# Patient Record
Sex: Female | Born: 1950 | Race: White | Hispanic: No | Marital: Married | Smoking: Never smoker
Health system: Southern US, Community
[De-identification: ages and names within clinical notes are randomized; demographics above are authoritative.]

## PROBLEM LIST (undated history)

## (undated) ENCOUNTER — Emergency Department (HOSPITAL_BASED_OUTPATIENT_CLINIC_OR_DEPARTMENT_OTHER): Admission: EM | Payer: Medicare HMO

## (undated) DIAGNOSIS — D649 Anemia, unspecified: Secondary | ICD-10-CM

## (undated) DIAGNOSIS — E2839 Other primary ovarian failure: Secondary | ICD-10-CM

## (undated) DIAGNOSIS — E7800 Pure hypercholesterolemia, unspecified: Secondary | ICD-10-CM

## (undated) DIAGNOSIS — M1990 Unspecified osteoarthritis, unspecified site: Secondary | ICD-10-CM

## (undated) DIAGNOSIS — E785 Hyperlipidemia, unspecified: Secondary | ICD-10-CM

## (undated) DIAGNOSIS — T7840XA Allergy, unspecified, initial encounter: Secondary | ICD-10-CM

## (undated) DIAGNOSIS — F0390 Unspecified dementia without behavioral disturbance: Secondary | ICD-10-CM

## (undated) DIAGNOSIS — M1991 Primary osteoarthritis, unspecified site: Secondary | ICD-10-CM

## (undated) DIAGNOSIS — M5136 Other intervertebral disc degeneration, lumbar region: Secondary | ICD-10-CM

## (undated) DIAGNOSIS — N809 Endometriosis, unspecified: Secondary | ICD-10-CM

## (undated) DIAGNOSIS — M51369 Other intervertebral disc degeneration, lumbar region without mention of lumbar back pain or lower extremity pain: Secondary | ICD-10-CM

## (undated) DIAGNOSIS — K8050 Calculus of bile duct without cholangitis or cholecystitis without obstruction: Secondary | ICD-10-CM

## (undated) DIAGNOSIS — J40 Bronchitis, not specified as acute or chronic: Secondary | ICD-10-CM

## (undated) DIAGNOSIS — R413 Other amnesia: Secondary | ICD-10-CM

## (undated) DIAGNOSIS — J45909 Unspecified asthma, uncomplicated: Secondary | ICD-10-CM

## (undated) DIAGNOSIS — G9332 Myalgic encephalomyelitis/chronic fatigue syndrome: Secondary | ICD-10-CM

## (undated) DIAGNOSIS — F329 Major depressive disorder, single episode, unspecified: Secondary | ICD-10-CM

## (undated) HISTORY — PX: TUBAL LIGATION: SHX77

## (undated) HISTORY — DX: Pure hypercholesterolemia, unspecified: E78.00

## (undated) HISTORY — DX: Unspecified asthma, uncomplicated: J45.909

## (undated) HISTORY — DX: Primary osteoarthritis, unspecified site: M19.91

## (undated) HISTORY — DX: Unspecified osteoarthritis, unspecified site: M19.90

## (undated) HISTORY — DX: Anemia, unspecified: D64.9

## (undated) HISTORY — DX: Other amnesia: R41.3

## (undated) HISTORY — DX: Major depressive disorder, single episode, unspecified: F32.9

## (undated) HISTORY — PX: OTHER SURGICAL HISTORY: SHX169

## (undated) HISTORY — DX: Other intervertebral disc degeneration, lumbar region without mention of lumbar back pain or lower extremity pain: M51.369

## (undated) HISTORY — DX: Hypercalcemia: E83.52

## (undated) HISTORY — DX: Myalgic encephalomyelitis/chronic fatigue syndrome: G93.32

## (undated) HISTORY — DX: Other intervertebral disc degeneration, lumbar region: M51.36

## (undated) HISTORY — DX: Calculus of bile duct without cholangitis or cholecystitis without obstruction: K80.50

## (undated) HISTORY — DX: Allergy, unspecified, initial encounter: T78.40XA

## (undated) HISTORY — DX: Unspecified dementia, unspecified severity, without behavioral disturbance, psychotic disturbance, mood disturbance, and anxiety: F03.90

## (undated) HISTORY — DX: Other primary ovarian failure: E28.39

## (undated) HISTORY — DX: Bronchitis, not specified as acute or chronic: J40

## (undated) HISTORY — DX: Hyperlipidemia, unspecified: E78.5

---

## 2006-06-30 ENCOUNTER — Other Ambulatory Visit: Admission: RE | Admit: 2006-06-30 | Discharge: 2006-06-30 | Payer: Self-pay | Admitting: Internal Medicine

## 2006-06-30 ENCOUNTER — Ambulatory Visit: Payer: Self-pay | Admitting: Internal Medicine

## 2006-06-30 ENCOUNTER — Encounter (INDEPENDENT_AMBULATORY_CARE_PROVIDER_SITE_OTHER): Payer: Self-pay | Admitting: *Deleted

## 2007-07-16 ENCOUNTER — Ambulatory Visit: Payer: Self-pay | Admitting: Internal Medicine

## 2007-07-31 ENCOUNTER — Ambulatory Visit: Payer: Self-pay | Admitting: Internal Medicine

## 2007-07-31 ENCOUNTER — Encounter: Payer: Self-pay | Admitting: Internal Medicine

## 2008-05-24 ENCOUNTER — Ambulatory Visit: Payer: Self-pay | Admitting: Cardiology

## 2008-06-10 ENCOUNTER — Ambulatory Visit: Payer: Self-pay | Admitting: Cardiology

## 2008-06-10 LAB — CONVERTED CEMR LAB
ALT: 20 units/L (ref 0–35)
AST: 19 units/L (ref 0–37)
Albumin: 4.3 g/dL (ref 3.5–5.2)
Alkaline Phosphatase: 57 units/L (ref 39–117)
Calcium: 9.8 mg/dL (ref 8.4–10.5)
Chloride: 106 meq/L (ref 96–112)
Creatinine, Ser: 0.8 mg/dL (ref 0.4–1.2)
HDL: 34.8 mg/dL — ABNORMAL LOW (ref >39.0)
LDL Cholesterol: 136 mg/dL — ABNORMAL HIGH (ref 0–99)
Sodium: 140 meq/L (ref 135–145)
Total CHOL/HDL Ratio: 5.4
Total Protein: 7 g/dL (ref 6.0–8.3)
Triglycerides: 85 mg/dL (ref 0–149)

## 2008-06-14 ENCOUNTER — Ambulatory Visit: Payer: Self-pay

## 2008-06-14 ENCOUNTER — Encounter: Payer: Self-pay | Admitting: Cardiology

## 2009-07-11 ENCOUNTER — Emergency Department (HOSPITAL_COMMUNITY): Admission: EM | Admit: 2009-07-11 | Discharge: 2009-07-11 | Payer: Self-pay | Admitting: Emergency Medicine

## 2009-10-03 ENCOUNTER — Encounter: Admission: RE | Admit: 2009-10-03 | Discharge: 2009-10-03 | Payer: Self-pay | Admitting: Internal Medicine

## 2011-02-26 NOTE — Assessment & Plan Note (Signed)
[facility]                            CARDIOLOGY OFFICE NOTE   NAME:[name] Young, [name] Young                          MRN:          [id number]  DATE:05/24/2008                            DOB:          [birth year]    PRIMARY CARE PHYSICIAN:  Jonita Albee, MD   HISTORY OF PRESENT ILLNESS:  This is a 60 year old with a history of  hypercholesterolemia who presents to Cardiology Clinic for evaluation of  dyspnea on exertion.  The patient states that for the last several  months she has felt fatigue and she is unable to keep up with her  family.  She is able to walk at a slow pace; however, if she tries to  speed up and walk faster, she gets short of breath.  She is able to walk  slowly on flat ground with no dyspnea.  She is able climb a flight of  stairs; however, she gets short of breath if she hurries.  She feels  like she is more tired and fatigued than normal and has shortness of  breath with exertion, and it is new for her.  She denies any chest pain  or tightness.  She denies any orthopnea or PND.  She denies any frank  syncope; however, she did have one episode of lightheadedness/presyncope  while at work.  She was standing up in her office and suddenly felt  lightheaded and had to sit down.  She did not actually pass out.  She  had one episode like this and then it has not been repeated.  She states  that she does sleep poorly.  She states she gets leg pain and states  that her legs hurt; however, her legs hurt after standing for long time  and often moving around will decrease the pain in the legs.  She has  been on simvastatin in the past; however, she stopped it because she  thought it was affecting her sleep.  EKG today normal sinus rhythm, poor  anterior R wave progressions, and she is actually bradycardic at rate of  58.   PAST MEDICAL HISTORY:  1. Hypercholesterolemia.  2. Low vitamin D.  3. Chronic back pain.   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  She has  not smoked since her teens.  She does not drink alcohol or use illicit  drugs.  She has 3 children and is married.   FAMILY HISTORY:  The patient's father had lung cancer.  Her grandmother  had a heart attack at the age of 69.   MEDICATIONS:  The patient takes calcium, vitamin D, and 81 mg daily of  aspirin.   REVIEW OF SYSTEMS:  Negative except as noted in the history of present  illness.   PHYSICAL EXAMINATION:  VITAL SIGNS:  Today, weight 148 pounds, blood  pressure 105/60, heart rate 58 and regular.  GENERAL:  No apparent distress.  Mildly obese female.  NEUROLOGICAL:  Alert and oriented x3.  Affect is somewhat depressed.  NECK:  No JVD.  Thyroid not enlarged  and not nodular.  CARDIOVASCULAR:  Regular S1 and S2.  No S3.  No S4.  No murmur.  No  peripheral edema.  No carotid bruit.  LUNGS:  Clear to auscultation bilaterally with normal respiratory  effort.  ABDOMEN:  Soft, nontender, and mildly obese.  No hepatosplenomegaly.  EXTREMITIES:  No clubbing or cyanosis.  HEENT:  Normal exam.  SKIN:  Normal exam.  MUSCULOSKELETAL:  Normal exam.   ASSESSMENT AND PLAN:  This is a 60 year old with a history of  hypercholesterolemia who presents for evaluation of dyspnea on exertion.  The patient does state that she is somewhat short of breath and fatigued  over the past 6-8 months when she tries to walk fast or if she tries to  climb up a flight of stairs fast.  She is able to do these at a slow  pace without shortness of breath.  She feels in general, she is more  tired than usual.  She did have one episode of presyncope while standing  up at her office, this was several weeks ago.  Her EKG shows poor  anterior R wave progression and is otherwise unremarkable.  She does  have a somewhat depressed affect and slow speech.  Thyroid is  unremarkable on exam.  She is not volume overloaded on exam.  I think,  it is very possible that this patient  has hypothyroidism.  Her only risk  factor for CAD is hypercholesterolemia.   PLAN:  We will check thyroid function test for this patient.  We will  also check lipids and if her LDL is significantly elevated, we will  start her on a different statin than what she has been on in the past  and see if she can tolerate it.  Finally, we will obtain an stress echo  to assess for any evidence of ischemia and to look for any baseline  cardiac abnormalities.     Marca Ancona, MD  Electronically Signed    DM/MedQ  DD: 05/24/2008  DT: 05/25/2008  Job #: [id number]   cc:   Jonita Albee, M.D.  Noralyn Pick. Eden Emms, MD, [facility]

## 2011-03-01 NOTE — Assessment & Plan Note (Signed)
[facility]                              [place] OFFICE NOTE   NAME:[name], [name]                          MRN:          [id number]  DATE:06/30/2006                            DOB:          [birth year]    CHIEF COMPLAINT:  New patient needs Pap smear.   HISTORY OF PRESENT ILLNESS:  [name] [name] is a 60 year old non-smoking married  white female who has had no physician routine care for over 5 years who  comes in today for a first time visit at the urging of her husband to get  regular checkups. When asked about specific concerns of health it may have  been because she is always tired.  Apparently no acute changes in the recent  history.  Her last Pap smear was greater than 5-10 years ago.  She has not  had a mammogram.  Unknown tetanus, no colonoscopy screening.   PAST MEDICAL HISTORY:  See data base.  Anemia as a child and during  pregnancy.  Asthma in childhood but not now.  Chickenpox as a child.  Seasonal allergies.   SURGERIES:  Laparoscopic 1978 and 1981 with endometriosis.  Three  pregnancies 1982, 1984 and 1985.  The last two were C-sections.  Bilateral  tubal ligation before 1995.  Last menstrual period over 5 years ago as  above.   MEDICATIONS:  None.   ALLERGIES:  Drug allergies CODEINE.   SOCIAL HISTORY:  Household of two.  Works as a Haematologist, desk job.  Currently no regular exercise but is trying to walk at times. No tobacco  except remote a couple times in college.  Some caffeine, no alcohol.  Gets 5-  8 hours of sleep a night and states that she has frequent awakenings but  does not know why and this is not a change recently.   FAMILY HISTORY:  Father died at age 71, had lung cancer and prostate cancer,  but she did not tell me why he died. Mom is generally healthy and energetic  at age 37.  There is a family history of arthritis.  Negative for thyroid  disease, although mom is on osteoporosis medication.   REVIEW OF  SYSTEMS:  Negative for chest pain, shortness of breath.  Positive  for constipation but not acute onset.  No major change in bowel habits,  blood in her stool. Weight has tended to increase over the last number of  years from lifestyle indiscretions.  Fatigue as above. She has occasional  nocturia but not problematic.  No obvious restless legs.  She does snore but  no history consistent with sleep apnea except her fatigue and awakening.  Denies specific anxiety, depression at present.  Arthritis negative but she  does have some leg aching, varicose veins in her lower extremities.  They do  swell up some time by the end of the day.  She also has prominent vessels in  the palms of her hands with some pain but no weakness and some feeling of  swelling in her hands.  Occasional hot flashes.  GU she has  chronic external  vaginal symptoms that she has been using Monistat q. h.s. chronically which  helps it feel better.  Denies specific itching but says there is irritation.  No unusual bleeding.   OBJECTIVE:  VITAL SIGNS:  Height 5 foot  3/4 inches.  Weight 165.  Pulse 72  and regular.  Blood pressure 120/80.  GENERAL:  This is a well-developed, well-nourished, sleepy, tired, slow  speaking, appropriate, pleasant white female in no acute distress.  She is  able to [facility] during her office wait without difficulty.  She is  fasting today.  HEENT:  Normocephalic.  TMs clear.  Eyes PERRLA, sclerae non-anicteric.  She  is wearing glasses.  Naris patent. OP clear.  Teeth in good repair.  NECK:  Without masses or thyroid nodules.  No bruits.  CHEST:  CTAB.  CARDIAC:  S1, S2.  No gallops, murmurs.  Peripheral pulses present without  delay.  Negative CC but trace pretibial edema and some VS varicosities but  no cords or redness.  ABDOMEN:  Soft, no thyromegaly, guarding or rebound.  EXTERNAL GU:  Looks normal.  Cervix os clear.  Scant white discharge.  Pap  done.  Bimanual: Negative CMT.  Adnexa  clear.  Uterus is upper limits of  normal on the right but there are no masses.  Rectovaginal confirmed.  NEUROLOGIC:  Appears grossly intact.  There are no tremors.   EKG showed normal sinus rhythm with no acute changes.   LABORATORY DATA:  Urinalysis is clear, specific gravity 1.030.   IMPRESSION:  Problem:  1. Wellness exam, preventive visit with lack of care over the last 10      years.  Pap and pelvic done today.  2. Fatigue, may be multifactorial.  Certainly sounds related to      interrupted sleep but is unclear at this time why this is occurring.  3. Chronic vulvitis vaginal symptoms which she is self-treating with      antifungals.  Discussed trying Diflucan 150 one p.o. q. week x 4 #4,      refills x1 and try to avoid the topical's for now.   We will get CPX labs today and follow up in about a month about her labs and  her other symptoms.  She wishes to get her mammogram on her own.  We will  discuss other preventive measures that are appropriate when she comes back  in a month.                                   Neta Mends. Fabian Sharp, MD   WKP/MedQ  DD:  06/30/2006  DT:  07/01/2006  Job #:  [id number]

## 2011-09-25 ENCOUNTER — Ambulatory Visit (INDEPENDENT_AMBULATORY_CARE_PROVIDER_SITE_OTHER): Payer: BC Managed Care – PPO

## 2011-09-25 DIAGNOSIS — J40 Bronchitis, not specified as acute or chronic: Secondary | ICD-10-CM

## 2011-10-17 ENCOUNTER — Ambulatory Visit (INDEPENDENT_AMBULATORY_CARE_PROVIDER_SITE_OTHER): Payer: BC Managed Care – PPO

## 2011-10-17 DIAGNOSIS — R05 Cough: Secondary | ICD-10-CM

## 2011-10-17 DIAGNOSIS — R059 Cough, unspecified: Secondary | ICD-10-CM

## 2011-10-17 DIAGNOSIS — J069 Acute upper respiratory infection, unspecified: Secondary | ICD-10-CM

## 2012-02-09 ENCOUNTER — Ambulatory Visit (INDEPENDENT_AMBULATORY_CARE_PROVIDER_SITE_OTHER): Payer: BC Managed Care – PPO | Admitting: Internal Medicine

## 2012-02-09 VITALS — BP 115/72 | HR 69 | Temp 98.5°F | Resp 18 | Ht 60.5 in | Wt 154.0 lb

## 2012-02-09 DIAGNOSIS — J40 Bronchitis, not specified as acute or chronic: Secondary | ICD-10-CM

## 2012-02-09 DIAGNOSIS — J329 Chronic sinusitis, unspecified: Secondary | ICD-10-CM

## 2012-02-09 DIAGNOSIS — J0100 Acute maxillary sinusitis, unspecified: Secondary | ICD-10-CM

## 2012-02-09 MED ORDER — CEFDINIR 300 MG PO CAPS: 300.0000 mg | ORAL_CAPSULE | Freq: Two times a day (BID) | ORAL | Status: AC

## 2012-02-09 MED ORDER — BENZONATATE 200 MG PO CAPS: 200.0000 mg | ORAL_CAPSULE | Freq: Three times a day (TID) | ORAL | Status: AC | PRN

## 2012-02-09 NOTE — Patient Instructions (Signed)

## 2012-02-09 NOTE — Progress Notes (Signed)
  Subjective:    Patient ID: [name] Young, female    DOB: 12-01-1950, 61 y.o.   MRN: [id number]  HPI Has cough and green nasal disch. No sob, cp, or fever   Review of Systems     Objective:   Physical Exam Sinuses are tender Lungs are clear with upper coarse bs.       Assessment & Plan:  Bronchitis and sinusitis See meds

## 2012-02-20 ENCOUNTER — Telehealth: Payer: Self-pay

## 2012-02-20 NOTE — Telephone Encounter (Signed)
Patient states that the mucus became clear but still having a lot of liquid in her cough and wanting to know if she can have another zpac,  States that mucus is starting to turn green again.

## 2012-02-20 NOTE — Telephone Encounter (Signed)
Pt states that she was seen by Dr.Guest a week ago and was prescribed omnicef, pt states that the URI cleared up but now symptoms are beginning to return. Pt would like to have a Z-Pac called into the pharmacy

## 2012-02-21 NOTE — Telephone Encounter (Signed)
What are her other symptoms? Facial pressure/pain? Nasal congestion?  ST? Fever? Headache? Dizziness? SOB?

## 2012-02-22 NOTE — Telephone Encounter (Signed)
Patient called back, stated symptoms were very ST, nasal congestion, lots of coughing, sore ribs (from coughing), and when patient lays down congestion is more severe. When speaking to patient she almost began to cry because she was so upset about the illness, and now not being able to see mother on Mother's day because of illness.

## 2012-02-23 MED ORDER — AZITHROMYCIN 250 MG PO TABS: ORAL_TABLET | ORAL | Status: AC

## 2012-02-23 NOTE — Telephone Encounter (Signed)
Zpack sent in but this is probably viral so if she is not improving in 24-48 hours from starting the medication, that is why.

## 2012-02-24 NOTE — Telephone Encounter (Signed)
Patient notified and voiced understanding. Pharmacy called her yesterday when it was ready and already feeling better since started medication.

## 2012-08-12 ENCOUNTER — Ambulatory Visit (INDEPENDENT_AMBULATORY_CARE_PROVIDER_SITE_OTHER): Payer: BC Managed Care – PPO | Admitting: Family Medicine

## 2012-08-12 ENCOUNTER — Ambulatory Visit: Payer: BC Managed Care – PPO

## 2012-08-12 VITALS — BP 145/82 | HR 73 | Temp 97.5°F | Resp 18 | Ht 60.5 in | Wt 161.6 lb

## 2012-08-12 DIAGNOSIS — J40 Bronchitis, not specified as acute or chronic: Secondary | ICD-10-CM

## 2012-08-12 DIAGNOSIS — R05 Cough: Secondary | ICD-10-CM

## 2012-08-12 DIAGNOSIS — R5383 Other fatigue: Secondary | ICD-10-CM

## 2012-08-12 LAB — POCT CBC
Granulocyte percent: 59.4 %G (ref 37–80)
Hemoglobin: 13.5 g/dL (ref 12.2–16.2)
MID (cbc): 0.4 (ref 0–0.9)
MPV: 8.8 fL (ref 0–99.8)
POC Granulocyte: 3.9 (ref 2–6.9)
POC MID %: 6.8 %M (ref 0–12)
Platelet Count, POC: 282 10*3/uL (ref 142–424)
RBC: 4.69 M/uL (ref 4.04–5.48)
WBC: 6.5 10*3/uL (ref 4.6–10.2)

## 2012-08-12 LAB — GLUCOSE, POCT (MANUAL RESULT ENTRY): POC Glucose: 88 mg/dl (ref 70–99)

## 2012-08-12 MED ORDER — BENZONATATE 100 MG PO CAPS: 100.0000 mg | ORAL_CAPSULE | Freq: Two times a day (BID) | ORAL | Status: DC | PRN

## 2012-08-12 MED ORDER — CEFTRIAXONE SODIUM 1 G IJ SOLR
1.0000 g | Freq: Once | INTRAMUSCULAR | Status: AC
  Administered 2012-08-12: 1 g via INTRAMUSCULAR

## 2012-08-12 MED ORDER — CEFDINIR 300 MG PO CAPS: 300.0000 mg | ORAL_CAPSULE | Freq: Two times a day (BID) | ORAL | Status: DC

## 2012-08-12 NOTE — Progress Notes (Signed)
SUBJECTIVE:  [name] Young is a 61 y.o. female who complains of coryza, congestion, sneezing, sore throat, swollen glands, post nasal drip, productive cough, chills and yellow nasal discharge for 4 days.  She denies a history of anorexia, chest pain, dizziness, shortness of breath, vomiting, weakness and weight loss. She has a history of asthma as a child. Patient does not smoke cigarettes.   OBJECTIVE: Vitals as noted above. Appearance: overweight, well hydrated and ill-appearing.  ENT- bilateral TM normal without fluid or infection, neck has bilateral anterior cervical nodes enlarged and pharynx erythematous without exudate.  Chest - clear to auscultation, no wheezes, rales or rhonchi, symmetric air entry. Patient does have transmitted upper airway sounds.  Lab Results  Component Value Date   WBC 6.5 08/12/2012   HGB 13.5 08/12/2012   HCT 43.9 08/12/2012   MCV 93.6 08/12/2012   x-ray was interpreted and reviewed by me today. Patient's chest x-ray 2 view shows some peribronchial thickening but no true focal findings or signs of infiltrate. Overview pending.   ASSESSMENT:  bronchitis  PLAN: Symptomatic therapy suggested: push fluids, rest, return office visit prn if symptoms persist or worsen and patient does appear ill today so we will treat her with antibiotics. Patient was given 1 g of ceftriaxone today and will do 10 days of Omnicef per patient request. Patient was also given a prescription for Tessalon Perles.. Call or return to clinic prn if these symptoms worsen or fail to improve as anticipated. X-ray did not show any signs of pneumonia and white blood cell count was within normal limits. This is likely viral but once again with patient looking L. decided to treat.

## 2012-08-12 NOTE — Addendum Note (Signed)
Addended by: Eulah Pont on: 08/12/2012 08:36 PM   Modules accepted: Orders

## 2012-08-31 ENCOUNTER — Telehealth: Payer: Self-pay

## 2012-08-31 NOTE — Telephone Encounter (Signed)
Pt would like to know lab results. Best# [phone]

## 2012-09-01 NOTE — Telephone Encounter (Signed)
Spoke w/pt and let her know that both labs were done in house and were normal. Pt thanked [place] for calling.

## 2012-10-30 ENCOUNTER — Ambulatory Visit (INDEPENDENT_AMBULATORY_CARE_PROVIDER_SITE_OTHER): Payer: BC Managed Care – PPO | Admitting: Physician Assistant

## 2012-10-30 VITALS — BP 122/76 | HR 69 | Temp 97.6°F | Resp 18 | Ht 61.0 in | Wt 157.0 lb

## 2012-10-30 DIAGNOSIS — J309 Allergic rhinitis, unspecified: Secondary | ICD-10-CM

## 2012-10-30 DIAGNOSIS — R05 Cough: Secondary | ICD-10-CM

## 2012-10-30 DIAGNOSIS — J302 Other seasonal allergic rhinitis: Secondary | ICD-10-CM

## 2012-10-30 HISTORY — DX: Other seasonal allergic rhinitis: J30.2

## 2012-10-30 LAB — POCT CBC
HCT, POC: 46.5 % (ref 37.7–47.9)
Hemoglobin: 14.6 g/dL (ref 12.2–16.2)
Lymph, poc: 2.4 (ref 0.6–3.4)
MCHC: 31.4 g/dL — AB (ref 31.8–35.4)
MCV: 92.7 fL (ref 80–97)
POC LYMPH PERCENT: 39.9 %L (ref 10–50)
RDW, POC: 12.4 %
WBC: 6.1 10*3/uL (ref 4.6–10.2)

## 2012-10-30 MED ORDER — BENZONATATE 100 MG PO CAPS: 100.0000 mg | ORAL_CAPSULE | Freq: Two times a day (BID) | ORAL | Status: DC | PRN

## 2012-10-30 MED ORDER — DOXYCYCLINE HYCLATE 100 MG PO TABS: 100.0000 mg | ORAL_TABLET | Freq: Two times a day (BID) | ORAL | Status: DC

## 2012-10-30 NOTE — Progress Notes (Signed)
   [address] [place] [address]   Phone [phone]  Subjective:    Patient ID: [name] Young, female    DOB: 1950-11-16, 62 y.o.   MRN: [id number]  HPI Pt presents to clinic with about 10d h/o cold symptoms.  She went to her sons house and they have a dog which started her allergies acting up.  She has had some nasal congestion but her cough is the worst.  She is having some SOB but no wheezing.  She has h/o childhood asthma.  She does not like tessalon perles because they make her numb and she does not like to take any cough meds with alcohol in it.  She states she gets bronchitis in October and again in the spring.  She states her allergies are to bad to treat. She currently is coughing up green sputum.  Review of Systems  Constitutional: Negative for fever and chills.  HENT: Positive for rhinorrhea, postnasal drip and sinus pressure. Negative for sore throat.   Respiratory: Positive for cough.   Neurological: Negative for headaches.       Objective:   Physical Exam  Vitals reviewed. Constitutional: She is oriented to person, place, and time. She appears well-developed and well-nourished.  HENT:  Head: Normocephalic and atraumatic.  Right Ear: Hearing, tympanic membrane, external ear and ear canal normal.  Left Ear: Hearing, tympanic membrane, external ear and ear canal normal.  Nose: Nose normal.  Mouth/Throat: Uvula is midline, oropharynx is clear and moist and mucous membranes are normal.  Eyes: Conjunctivae normal are normal.  Cardiovascular: Normal rate, regular rhythm and normal heart sounds.   No murmur heard. Pulmonary/Chest: Effort normal and breath sounds normal.  Neurological: She is alert and oriented to person, place, and time.  Skin: Skin is warm and dry.  Psychiatric: She has a normal mood and affect. Her behavior is normal. Judgment and thought content normal.     Results for orders placed in visit on 10/30/12  POCT CBC      Component Value Range   WBC  6.1  4.6 - 10.2 K/uL   Lymph, poc 2.4  0.6 - 3.4   POC LYMPH PERCENT 39.9  10 - 50 %L   MID (cbc) 0.3  0 - 0.9   POC MID % 4.7  0 - 12 %M   POC Granulocyte 3.4  2 - 6.9   Granulocyte percent 55.4  37 - 80 %G   RBC 5.02  4.04 - 5.48 M/uL   Hemoglobin 14.6  12.2 - 16.2 g/dL   HCT, POC 21.3  08.6 - 47.9 %   MCV 92.7  80 - 97 fL   MCH, POC 29.1  27 - 31.2 pg   MCHC 31.4 (*) 31.8 - 35.4 g/dL   RDW, POC 57.8     Platelet Count, POC 298  142 - 424 K/uL   MPV 8.4  0 - 99.8 fL        Assessment & Plan:   1. Cough  POCT CBC, doxycycline (VIBRA-TABS) 100 MG tablet, benzonatate (TESSALON) 100 MG capsule  2. 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  Pt to push fluids, use humidifier.  Pt to add Mucinex to help get out the sputum.

## 2013-05-09 ENCOUNTER — Ambulatory Visit (INDEPENDENT_AMBULATORY_CARE_PROVIDER_SITE_OTHER): Payer: BC Managed Care – PPO | Admitting: Internal Medicine

## 2013-05-09 ENCOUNTER — Ambulatory Visit: Payer: BC Managed Care – PPO

## 2013-05-09 VITALS — BP 120/72 | HR 74 | Temp 98.1°F | Resp 18 | Ht 60.5 in | Wt 158.0 lb

## 2013-05-09 DIAGNOSIS — J329 Chronic sinusitis, unspecified: Secondary | ICD-10-CM

## 2013-05-09 DIAGNOSIS — G4700 Insomnia, unspecified: Secondary | ICD-10-CM

## 2013-05-09 DIAGNOSIS — R05 Cough: Secondary | ICD-10-CM

## 2013-05-09 DIAGNOSIS — R0602 Shortness of breath: Secondary | ICD-10-CM

## 2013-05-09 DIAGNOSIS — R059 Cough, unspecified: Secondary | ICD-10-CM

## 2013-05-09 MED ORDER — PREDNISONE 20 MG PO TABS: ORAL_TABLET | ORAL | Status: DC

## 2013-05-09 MED ORDER — AMOXICILLIN 875 MG PO TABS: 875.0000 mg | ORAL_TABLET | Freq: Two times a day (BID) | ORAL | Status: DC

## 2013-05-09 MED ORDER — ALPRAZOLAM 0.5 MG PO TABS: 0.5000 mg | ORAL_TABLET | Freq: Every evening | ORAL | Status: DC | PRN

## 2013-05-09 NOTE — Progress Notes (Signed)
  Subjective:    Patient ID: [name] Young, female    DOB: 1951/07/06, 62 y.o.   MRN: [id number]  HPI complaining of congestion with cough that is productive especially in the morning for the past 10 days/getting worse/low-grade fever with night sweats/hoarseness/feels like she is short of breath at times but has no dyspnea on exertion Past history of asthma as a child but has never done well with inhalers Very lethargic with  fatigue Not sleeping well due to the death of her mother one week ago/unexpected after a traumatic hospital course/she can't stop thinking about this No past history of depression or anxiety Also his workplace stress   Review of Systems No recent weight loss No chest pain or palpitations No headache or vision changes No GI symptoms No GU symptoms    Objective:   Physical Exam BP 120/72  Pulse 74  Temp(Src) 98.1 F (36.7 C) (Oral)  Resp 18  Ht 5' 0.5" (1.537 m)  Wt 158 lb (71.668 kg)  BMI 30.34 kg/m2  SpO2 97% HEENT= conjunctiva clear/TMs clear/nares boggy with purulent discharge on the right/tender right maxillary sinus/oral pharynx clear/no cervical adenopathy Lungs clear with diminished breath sounds at the bases and no wheezes on forced expiration Heart regular without murmur Abdomen benign Extremities without edema Neurological intact Mood is sad and tearful but affect is appropriate       [facility] reading (PRIMARY) by  Dr. Alan Riles=NAD   Assessment & Plan:  Shortness of breath Cough due to sinusitis Sinusitis Insomnia--grief reaction  Meds ordered this encounter  Medications  . predniSONE (DELTASONE) 20 MG tablet    Sig: 3/2/1 single daily dose for 3 days    Dispense:  6 tablet    Refill:  0  . amoxicillin (AMOXIL) 875 MG tablet    Sig: Take 1 tablet (875 mg total) by mouth 2 (two) times daily.    Dispense:  20 tablet    Refill:  0  . ALPRAZolam (XANAX) 0.5 MG tablet    Sig: Take 1 tablet (0.5 mg total) by mouth at bedtime as needed  for sleep.    Dispense:  30 tablet    Refill:  0   Recheck 2-3 days if not improving

## 2013-08-03 ENCOUNTER — Ambulatory Visit: Payer: BC Managed Care – PPO

## 2013-08-03 ENCOUNTER — Ambulatory Visit (INDEPENDENT_AMBULATORY_CARE_PROVIDER_SITE_OTHER): Payer: BC Managed Care – PPO | Admitting: Emergency Medicine

## 2013-08-03 VITALS — BP 110/64 | HR 66 | Temp 97.6°F | Resp 18 | Ht 60.25 in | Wt 163.0 lb

## 2013-08-03 DIAGNOSIS — M2550 Pain in unspecified joint: Secondary | ICD-10-CM

## 2013-08-03 LAB — POCT CBC
Hemoglobin: 13.9 g/dL (ref 12.2–16.2)
MCH, POC: 30.2 pg (ref 27–31.2)
MCHC: 32.6 g/dL (ref 31.8–35.4)
MID (cbc): 0.3 (ref 0–0.9)
MPV: 9.4 fL (ref 0–99.8)
POC Granulocyte: 2.5 (ref 2–6.9)
POC MID %: 5.8 %M (ref 0–12)
Platelet Count, POC: 249 10*3/uL (ref 142–424)
RBC: 4.6 M/uL (ref 4.04–5.48)
WBC: 5.3 10*3/uL (ref 4.6–10.2)

## 2013-08-03 LAB — POCT SEDIMENTATION RATE: POCT SED RATE: 15 mm/hr (ref 0–22)

## 2013-08-03 MED ORDER — MELOXICAM 15 MG PO TABS: 15.0000 mg | ORAL_TABLET | Freq: Every day | ORAL | Status: DC

## 2013-08-03 NOTE — Progress Notes (Signed)
[facility] [address] [place] [address] [phone]  Date:  08/03/2013   Name:  [name] Young   DOB:  02-07-1951   MRN:  [id number]  PCP:  Tally Due, MD    Chief Complaint: Joint Pain   History of Present Illness:  [name] Young is a 62 y.o. very pleasant female patient who presents with the following:  Plays piano.  Noticed that she has pain in left thumb over the past months.  This week realized the joint is somewhat swollen.  She has no history of injury or overuse.  Mother history of RA and father had OA.  She is quite concerned by the onset of the swelling.  No improvement with over the counter medications or other home remedies. Denies other complaint or health concern today.   Patient Active Problem List   Diagnosis Date Noted  . Seasonal allergies 10/30/2012    Past Medical History  Diagnosis Date  . Hyperlipidemia   . Allergy   . Anemia   . Asthma     Past Surgical History  Procedure Laterality Date  . Cesarean section    . Tubal ligation      History  Substance Use Topics  . Smoking status: Never Smoker   . Smokeless tobacco: Not on file  . Alcohol Use: No    Family History  Problem Relation Age of Onset  . Diabetes Paternal Grandmother   . Diabetes Paternal Grandfather     Allergies  Allergen Reactions  . Codeine Itching    Medication list has been reviewed and updated.  Current Outpatient Prescriptions on File Prior to Visit  Medication Sig Dispense Refill  . ALPRAZolam (XANAX) 0.5 MG tablet Take 1 tablet (0.5 mg total) by mouth at bedtime as needed for sleep.  30 tablet  0  . amoxicillin (AMOXIL) 875 MG tablet Take 1 tablet (875 mg total) by mouth 2 (two) times daily.  20 tablet  0  . predniSONE (DELTASONE) 20 MG tablet 3/2/1 single daily dose for 3 days  6 tablet  0   No current facility-administered medications on file prior to visit.    Review of Systems:  As per HPI, otherwise negative.     Physical Examination: Filed Vitals:   08/03/13 1154  BP: 110/64  Pulse: 66  Temp: 97.6 F (36.4 C)  Resp: 18   Filed Vitals:   08/03/13 1154  Height: 5' 0.25" (1.53 m)  Weight: 163 lb (73.936 kg)   Body mass index is 31.58 kg/(m^2). Ideal Body Weight: Weight in (lb) to have BMI = 25: 128.8   GEN: WDWN, NAD, Non-toxic, Alert & Oriented x 3 HEENT: Atraumatic, Normocephalic.  Ears and Nose: No external deformity. EXTR: No clubbing/cyanosis/edema NEURO: Normal gait.  PSYCH: Normally interactive. Conversant. Not depressed or anxious appearing.  Calm demeanor.  Left thumb swollen and tender (minimally) base of thumb.  Normal alignment and AROM fingers.    Assessment and Plan: polyarthralgias mobic  Signed,  Phillips Odor, MD   [facility] reading (PRIMARY) by  Dr. Dareen Piano.  Negative .  Results for orders placed in visit on 08/03/13  POCT CBC      Result Value Range   WBC 5.3  4.6 - 10.2 K/uL   Lymph, poc 2.5  0.6 - 3.4   POC LYMPH PERCENT 46.7  10 - 50 %L   MID (cbc) 0.3  0 - 0.9   POC MID % 5.8  0 - 12 %  M   POC Granulocyte 2.5  2 - 6.9   Granulocyte percent 47.5  37 - 80 %G   RBC 4.60  4.04 - 5.48 M/uL   Hemoglobin 13.9  12.2 - 16.2 g/dL   HCT, POC 16.1  09.6 - 47.9 %   MCV 92.7  80 - 97 fL   MCH, POC 30.2  27 - 31.2 pg   MCHC 32.6  31.8 - 35.4 g/dL   RDW, POC 04.5     Platelet Count, POC 249  142 - 424 K/uL   MPV 9.4  0 - 99.8 fL

## 2013-08-03 NOTE — Addendum Note (Signed)
Addended by: Carmelina Dane on: 08/03/2013 12:55 PM   Modules accepted: Orders

## 2013-08-16 ENCOUNTER — Telehealth: Payer: Self-pay

## 2013-08-16 NOTE — Telephone Encounter (Signed)
Pt would like to take something else for the swelling in her thumbs, pt is unable to take the mobic that was prescribed. Best# [phone] Pharmacy:[facility] on [address]

## 2013-08-17 MED ORDER — DICLOFENAC SODIUM 75 MG PO TBEC: 75.0000 mg | DELAYED_RELEASE_TABLET | Freq: Two times a day (BID) | ORAL | Status: DC

## 2013-08-17 NOTE — Telephone Encounter (Signed)
What problem has she had with the mobic? Called her, she indicates it made her dizzy. Now the dizziness has resolved. Please advise.

## 2013-08-17 NOTE — Telephone Encounter (Signed)
I will send in a different NSAID and if she gets no relief she should RTC to future lab testing.

## 2013-09-19 ENCOUNTER — Ambulatory Visit (INDEPENDENT_AMBULATORY_CARE_PROVIDER_SITE_OTHER): Payer: BC Managed Care – PPO | Admitting: Family Medicine

## 2013-09-19 ENCOUNTER — Ambulatory Visit: Payer: BC Managed Care – PPO

## 2013-09-19 VITALS — BP 126/72 | HR 76 | Temp 98.1°F | Resp 16 | Ht 60.6 in | Wt 164.6 lb

## 2013-09-19 DIAGNOSIS — R059 Cough, unspecified: Secondary | ICD-10-CM

## 2013-09-19 DIAGNOSIS — M2550 Pain in unspecified joint: Secondary | ICD-10-CM

## 2013-09-19 DIAGNOSIS — J209 Acute bronchitis, unspecified: Secondary | ICD-10-CM

## 2013-09-19 DIAGNOSIS — R05 Cough: Secondary | ICD-10-CM

## 2013-09-19 DIAGNOSIS — R0602 Shortness of breath: Secondary | ICD-10-CM

## 2013-09-19 DIAGNOSIS — R079 Chest pain, unspecified: Secondary | ICD-10-CM

## 2013-09-19 LAB — POCT CBC
Granulocyte percent: 60.8 % (ref 37–80)
HCT, POC: 44.8 % (ref 37.7–47.9)
Hemoglobin: 14.2 g/dL (ref 12.2–16.2)
Lymph, poc: 2.4 (ref 0.6–3.4)
MCH, POC: 29.8 pg (ref 27–31.2)
MCHC: 31.7 g/dL — AB (ref 31.8–35.4)
MCV: 94.1 fL (ref 80–97)
MID (cbc): 0.4 (ref 0–0.9)
MPV: 9.6 fL (ref 0–99.8)
POC Granulocyte: 4.3 (ref 2–6.9)
POC LYMPH PERCENT: 33.8 % (ref 10–50)
POC MID %: 5.4 %M (ref 0–12)
Platelet Count, POC: 252 10*3/uL (ref 142–424)
RBC: 4.76 M/uL (ref 4.04–5.48)
RDW, POC: 13.1 %
WBC: 7 10*3/uL (ref 4.6–10.2)

## 2013-09-19 MED ORDER — PROMETHAZINE-DM 6.25-15 MG/5ML PO SYRP: 5.0000 mL | ORAL_SOLUTION | Freq: Every evening | ORAL | Status: DC | PRN

## 2013-09-19 MED ORDER — CEFDINIR 300 MG PO CAPS: 300.0000 mg | ORAL_CAPSULE | Freq: Two times a day (BID) | ORAL | Status: DC

## 2013-09-19 MED ORDER — CELECOXIB 100 MG PO CAPS: 100.0000 mg | ORAL_CAPSULE | Freq: Two times a day (BID) | ORAL | Status: DC

## 2013-09-19 NOTE — Patient Instructions (Signed)
Acute Bronchitis Bronchitis is inflammation of the airways that extend from the windpipe into the lungs (bronchi). The inflammation often causes mucus to develop. This leads to a cough, which is the most common symptom of bronchitis.  In acute bronchitis, the condition usually develops suddenly and goes away over time, usually in a couple weeks. Smoking, allergies, and asthma can make bronchitis worse. Repeated episodes of bronchitis may cause further lung problems.  CAUSES Acute bronchitis is most often caused by the same virus that causes a cold. The virus can spread from person to person (contagious).  SIGNS AND SYMPTOMS   Cough.   Fever.   Coughing up mucus.   Body aches.   Chest congestion.   Chills.   Shortness of breath.   Sore throat.  DIAGNOSIS  Acute bronchitis is usually diagnosed through a physical exam. Tests, such as chest X-rays, are sometimes done to rule out other conditions.  TREATMENT  Acute bronchitis usually goes away in a couple weeks. Often times, no medical treatment is necessary. Medicines are sometimes given for relief of fever or cough. Antibiotics are usually not needed but may be prescribed in certain situations. In some cases, an inhaler may be recommended to help reduce shortness of breath and control the cough. A cool mist vaporizer may also be used to help thin bronchial secretions and make it easier to clear the chest.  HOME CARE INSTRUCTIONS  Get plenty of rest.   Drink enough fluids to keep your urine clear or pale yellow (unless you have a medical condition that requires fluid restriction). Increasing fluids may help thin your secretions and will prevent dehydration.   Only take over-the-counter or prescription medicines as directed by your health care provider.   Avoid smoking and secondhand smoke. Exposure to cigarette smoke or irritating chemicals will make bronchitis worse. If you are a smoker, consider using nicotine gum or skin  patches to help control withdrawal symptoms. Quitting smoking will help your lungs heal faster.   Reduce the chances of another bout of acute bronchitis by washing your hands frequently, avoiding people with cold symptoms, and trying not to touch your hands to your mouth, nose, or eyes.   Follow up with your health care provider as directed.  SEEK MEDICAL CARE IF: Your symptoms do not improve after 1 week of treatment.  SEEK IMMEDIATE MEDICAL CARE IF:  You develop an increased fever or chills.   You have chest pain.   You have severe shortness of breath.  You have bloody sputum.   You develop dehydration.  You develop fainting.  You develop repeated vomiting.  You develop a severe headache. MAKE SURE YOU:   Understand these instructions.  Will watch your condition.  Will get help right away if you are not doing well or get worse. Document Released: 11/07/2004 Document Revised: 06/02/2013 Document Reviewed: 03/23/2013 [employer] Patient Information 2014 [employer].  

## 2013-09-19 NOTE — Progress Notes (Signed)
Chief Complaint:  Chief Complaint  Patient presents with  . Cough    pains in chest when she cough  . Shortness of Breath  . nasal drainage    HPI: [name] Young is a 62 y.o. female who is here for 1 week history of worsening sxs of green productive sputum,  Nasal drainage, mostly in AM in her chest, she is SOB and coughing. + wheezing History of asthma and allergies. CP with cughing. No fevers or chills. Just tired. She does not well with inhalers. Last asthma sxs were in childhood She has taken Chlortrimettin  Past Medical History  Diagnosis Date  . Hyperlipidemia   . Allergy   . Anemia   . Asthma    Past Surgical History  Procedure Laterality Date  . Cesarean section    . Tubal ligation     History   Social History  . Marital Status: Married    Spouse Name: N/A    Number of Children: N/A  . Years of Education: N/A   Social History Main Topics  . Smoking status: Never Smoker   . Smokeless tobacco: None  . Alcohol Use: No  . Drug Use: No  . Sexual Activity: No   Other Topics Concern  . None   Social History Narrative  . None   Family History  Problem Relation Age of Onset  . Diabetes Paternal Grandmother   . Diabetes Paternal Grandfather    Allergies  Allergen Reactions  . Codeine Itching  . Mobic [Meloxicam]     Dizzy/ lightheaded   Prior to Admission medications   Medication Sig Start Date End Date Taking? Authorizing Provider  ALPRAZolam Prudy Feeler) 0.5 MG tablet Take 1 tablet (0.5 mg total) by mouth at bedtime as needed for sleep. 05/09/13   Tonye Pearson, MD     ROS: The patient denies night sweats, unintentional weight loss, dyspnea on exertion, nausea, vomiting, abdominal pain, dysuria, hematuria, melena, numbness, weakness, or tingling.   All other systems have been reviewed and were otherwise negative with the exception of those mentioned in the HPI and as above.    PHYSICAL EXAM: Filed Vitals:   09/19/13 1428  BP: 126/72    Pulse: 76  Temp: 98.1 F (36.7 C)  Resp: 16  Spo2 96% Filed Vitals:   09/19/13 1428  Height: 5' 0.6" (1.539 m)  Weight: 164 lb 9.6 oz (74.662 kg)   Body mass index is 31.52 kg/(m^2).  General: Alert, no acute distress HEENT:  Normocephalic, atraumatic, oropharynx patent. EOMI, PERRLA, Tm nl, no exudates,min erythema throat, sinus nontender Cardiovascular:  Regular rate and rhythm, no rubs murmurs or gallops.  No Carotid bruits, radial pulse intact. No pedal edema.  Respiratory: Clear to auscultation bilaterally.  No wheezes, rales, or rhonchi.  No cyanosis, no use of accessory musculature GI: No organomegaly, abdomen is soft and non-tender, positive bowel sounds.  No masses. Skin: No rashes. Neurologic: Facial musculature symmetric. Psychiatric: Patient is appropriate throughout our interaction. Lymphatic: No cervical lymphadenopathy Musculoskeletal: Gait intact.   LABS: Results for orders placed in visit on 09/19/13  POCT CBC      Result Value Range   WBC 7.0  4.6 - 10.2 K/uL   Lymph, poc 2.4  0.6 - 3.4   POC LYMPH PERCENT 33.8  10 - 50 %L   MID (cbc) 0.4  0 - 0.9   POC MID % 5.4  0 - 12 %M   POC Granulocyte 4.3  2 - 6.9   Granulocyte percent 60.8  37 - 80 %G   RBC 4.76  4.04 - 5.48 M/uL   Hemoglobin 14.2  12.2 - 16.2 g/dL   HCT, POC 81.1  91.4 - 47.9 %   MCV 94.1  80 - 97 fL   MCH, POC 29.8  27 - 31.2 pg   MCHC 31.7 (*) 31.8 - 35.4 g/dL   RDW, POC 78.2     Platelet Count, POC 252  142 - 424 K/uL   MPV 9.6  0 - 99.8 fL     EKG/XRAY:   Primary read interpreted by [facility]. Conley Rolls at [facility]. No acute cardiopulmonary process    ASSESSMENT/PLAN: Encounter Diagnoses  Name Primary?  . Cough   . SOB (shortness of breath)   . Chest pain   . Joint pain   . Acute bronchitis Yes   Rx Omnicef, promethazine DM She has tried diclofenac and also mobic each has SEs, she wants to try Celebrex and see if she has SEs Decline steroids or albuterol inhaler F/u prn or ER  prn  Gross sideeffects, risk and benefits, and alternatives of medications d/w patient. Patient is aware that all medications have potential sideeffects and we are unable to predict every sideeffect or drug-drug interaction that may occur.  Hisham Provence PHUONG, DO 09/21/2013 11:47 AM

## 2013-10-05 ENCOUNTER — Ambulatory Visit (INDEPENDENT_AMBULATORY_CARE_PROVIDER_SITE_OTHER): Payer: BC Managed Care – PPO | Admitting: Physician Assistant

## 2013-10-05 ENCOUNTER — Ambulatory Visit: Payer: BC Managed Care – PPO

## 2013-10-05 VITALS — BP 132/70 | HR 73 | Temp 99.1°F | Resp 16 | Ht 60.0 in | Wt 164.0 lb

## 2013-10-05 DIAGNOSIS — J40 Bronchitis, not specified as acute or chronic: Secondary | ICD-10-CM

## 2013-10-05 DIAGNOSIS — R05 Cough: Secondary | ICD-10-CM

## 2013-10-05 DIAGNOSIS — J9801 Acute bronchospasm: Secondary | ICD-10-CM

## 2013-10-05 LAB — POCT CBC
Granulocyte percent: 46 % (ref 37–80)
HCT, POC: 43.8 % (ref 37.7–47.9)
Hemoglobin: 14 g/dL (ref 12.2–16.2)
Lymph, poc: 2.2 (ref 0.6–3.4)
MCH, POC: 29.9 pg (ref 27–31.2)
MCHC: 32 g/dL (ref 31.8–35.4)
MCV: 93.4 fL (ref 80–97)
MID (cbc): 0.4 (ref 0–0.9)
MPV: 8.8 fL (ref 0–99.8)
POC Granulocyte: 2.3 (ref 2–6.9)
POC LYMPH PERCENT: 45.3 % (ref 10–50)
POC MID %: 8.7 % (ref 0–12)
Platelet Count, POC: 236 10*3/uL (ref 142–424)
RBC: 4.69 M/uL (ref 4.04–5.48)
RDW, POC: 12.9 %
WBC: 4.9 10*3/uL (ref 4.6–10.2)

## 2013-10-05 LAB — POCT INFLUENZA A/B
Influenza A, POC: NEGATIVE
Influenza B, POC: NEGATIVE

## 2013-10-05 MED ORDER — AZITHROMYCIN 250 MG PO TABS: ORAL_TABLET | ORAL | Status: DC

## 2013-10-05 MED ORDER — BENZONATATE 100 MG PO CAPS: 100.0000 mg | ORAL_CAPSULE | Freq: Three times a day (TID) | ORAL | Status: DC | PRN

## 2013-10-05 NOTE — Progress Notes (Signed)
Subjective:    Patient ID: [name] Young, female    DOB: 1951-06-23, 62 y.o.   MRN: [id number]  HPI 62 year old female presents for recheck of acute bronchitis. Patient was initially evaluated on 09/19/13 with a 1 week history of worsening productive cough of green sputum, nasal drainage, SOB and wheezing. At that time she was placed on Omnicef 200 mg bid. She has not taken all of these antibiotics to date secondary to they "feel like acid going through my veins."   She continues to have a productive cough of yellow sputum. Cough is not worse any particular time of day or night. Some SOB and wheezing, although this is typical for her. She continues to have subjective fever and chills. Decreased appetite and fatigue. Some sinus headache. She states that she cannot tolerate albuterol inhaled because it feels like she is inhaling gasoline when she uses this.     No sick contacts or recent travels.   No leg trauma, sedentary periods, h/o cancer, or tobacco use.   Review of Systems  Constitutional: Positive for fever, chills, appetite change and fatigue.  HENT: Positive for congestion, postnasal drip, rhinorrhea, sinus pressure and sore throat. Negative for hearing loss, sneezing and tinnitus.   Respiratory: Positive for cough, chest tightness, shortness of breath and wheezing. Negative for choking and stridor.   Cardiovascular: Negative for chest pain.  Gastrointestinal: Positive for nausea. Negative for vomiting and diarrhea.  Neurological: Positive for headaches.     Past Medical History  Diagnosis Date  . Hyperlipidemia   . Allergy   . Anemia   . Asthma    Allergies  Allergen Reactions  . Codeine Itching  . Mobic [Meloxicam]     Dizzy/ lightheaded   Prior to Admission medications   Medication Sig Start Date End Date Taking? Authorizing Provider  ALPRAZolam Prudy Feeler) 0.5 MG tablet Take 1 tablet (0.5 mg total) by mouth at bedtime as needed for sleep. 05/09/13  Yes Tonye Pearson,  MD  celecoxib (CELEBREX) 100 MG capsule Take 1 capsule (100 mg total) by mouth 2 (two) times daily. Take with food 09/19/13  Yes Thao P Le, DO  promethazine-dextromethorphan (PROMETHAZINE-DM) 6.25-15 MG/5ML syrup Take 5 mLs by mouth at bedtime as needed for cough. 09/19/13  Yes Thao P Le, DO   History   Social History  . Marital Status: Married    Spouse Name: N/A    Number of Children: N/A  . Years of Education: N/A   Occupational History  . Not on file.   Social History Main Topics  . Smoking status: Never Smoker   . Smokeless tobacco: Not on file  . Alcohol Use: No  . Drug Use: No  . Sexual Activity: No   Other Topics Concern  . Not on file   Social History Narrative  . No narrative on file       Objective:   Physical Exam  Physical Exam: Filed Vitals:   10/05/13 1314  BP: 132/70  Pulse: 73  Temp: 99.1 F (37.3 C)  Resp: 16   Filed Weights   10/05/13 1314  Weight: 164 lb (74.39 kg)   Body mass index is 32.03 kg/(m^2).  General: Well developed, well nourished, in no acute distress. Head: Normocephalic, atraumatic, eyes without discharge, sclera non-icteric, nares are congested. Bilateral auditory canals clear, TM's are without perforation, pearly grey with reflective cone of light bilaterally. No sinus TTP. Oral cavity moist, dentition normal. Posterior pharynx with post nasal drip and  mild erythema. No peritonsillar abscess or tonsillar exudate. Uvula midline.  Neck: Supple. No thyromegaly. Full ROM. No lymphadenopathy. Lungs: Coarse breath sounds bilaterally without wheezes, rales, or rhonchi. Breathing is unlabored.  Heart: RRR with S1 S2. No murmurs, rubs, or gallops appreciated. Msk:  Strength and tone normal for age. Extremities: No clubbing or cyanosis. No edema. Neuro: Alert and oriented X 3. Moves all extremities spontaneously. CNII-XII grossly in tact. Psych:  Responds to questions appropriately with a normal affect.   CXR:  [facility] reading (PRIMARY)  by  Dr. Alwyn Ren. Negative. Unchanged from previous.   Labs: Results for orders placed in visit on 10/05/13  POCT CBC      Result Value Range   WBC 4.9  4.6 - 10.2 K/uL   Lymph, poc 2.2  0.6 - 3.4   POC LYMPH PERCENT 45.3  10 - 50 %L   MID (cbc) 0.4  0 - 0.9   POC MID % 8.7  0 - 12 %M   POC Granulocyte 2.3  2 - 6.9   Granulocyte percent 46.0  37 - 80 %G   RBC 4.69  4.04 - 5.48 M/uL   Hemoglobin 14.0  12.2 - 16.2 g/dL   HCT, POC 16.1  09.6 - 47.9 %   MCV 93.4  80 - 97 fL   MCH, POC 29.9  27 - 31.2 pg   MCHC 32.0  31.8 - 35.4 g/dL   RDW, POC 04.5     Platelet Count, POC 236  142 - 424 K/uL   MPV 8.8  0 - 99.8 fL  POCT INFLUENZA A/B      Result Value Range   Influenza A, POC Negative     Influenza B, POC Negative            Assessment & Plan:  62 year old female with bronchitis/RAD/cough -Azithromycin 250 MG #6 2 po first day then 1 po next 4 days no RF -Tessalon Perles 100 mg 1 po tid prn cough #30 no RF  -She declines albuterol inhaled stating this makes her lungs feel like she has inhaled "gasoline." She also declines a breathing treatment while in the office today.  -She declines prednisone stating this feels like an acid is going through her veins -Ok to refill cough syrup when she calls -Advised patient to get a humidifier and place this by her bed when sleeping -Mucinex -Rest/fluids -RTC precautions    Eula Listen, PA-C [facility] [address] [place], [place] [address] [phone]

## 2013-12-27 ENCOUNTER — Ambulatory Visit (INDEPENDENT_AMBULATORY_CARE_PROVIDER_SITE_OTHER): Payer: BC Managed Care – PPO | Admitting: Family Medicine

## 2013-12-27 VITALS — BP 130/70 | HR 76 | Temp 97.8°F | Resp 18 | Ht 60.5 in | Wt 167.2 lb

## 2013-12-27 DIAGNOSIS — J209 Acute bronchitis, unspecified: Secondary | ICD-10-CM

## 2013-12-27 MED ORDER — PROMETHAZINE-DM 6.25-15 MG/5ML PO SYRP: 5.0000 mL | ORAL_SOLUTION | Freq: Every evening | ORAL | Status: DC | PRN

## 2013-12-27 MED ORDER — CEFDINIR 300 MG PO CAPS: 600.0000 mg | ORAL_CAPSULE | Freq: Every day | ORAL | Status: DC

## 2013-12-27 NOTE — Progress Notes (Signed)
Subjective: Patient has history of several times a year getting respiratory tract infections. She has been treated with a number of antibiotics over the last year including doxycycline, amoxicillin, Omnicef, and azithromycin. One note says she has also had clarithromycin in the past. The patient says that Truman HaywardOmnicef works the best, though one note says that she had problems from it. She's been having a bad cough. She has to go with her husband in the hospital a couple days when he has a heart catheterization she wants to get feeling better she. She is coughing up some phlegm. She does not smoke. She's not been febrile.  Objective: TMs are normal. Throat was clear. Neck supple without significant nodes. Chest clear to auscultation. She does have a bad cough.  Assessment: Acute bronchitis  Plan: No studies were done today. Placed her on Omnicef says she says she does well with that. If she has any problems she is to let us know. If she continues with symptoms she'll need further workup. She did not want Tessalon. She feels like the promethazine DM helps her well, so that was prescribed.

## 2013-12-27 NOTE — Patient Instructions (Addendum)
Drink plenty of fluids and get enough rest  If not tolerating the antibiotic let me know  Return as needed if not improving

## 2013-12-29 ENCOUNTER — Telehealth: Payer: Self-pay

## 2013-12-29 NOTE — Telephone Encounter (Signed)
Pt is worried that the omnicef is causing some strange SE. Says she is feeling like her skin is thinner and her varicose veins are hurting. Wants to know if we can try Amox. She said you told her that we could switch her if she wanted.

## 2013-12-29 NOTE — Telephone Encounter (Signed)
PT HAS QUESTIONS RE MEDICATIONS SHE WAS GIVEN,IT ALSO RAISED HER BP,SHE WOULD LIKE TO TRY AMOXICILLIN IN A POWDER OR LIQUID FORM???   BEST PHONE FOR PT IS [phone]   [facility]

## 2013-12-31 ENCOUNTER — Other Ambulatory Visit: Payer: Self-pay | Admitting: Family Medicine

## 2013-12-31 MED ORDER — AMOXICILLIN 250 MG/5ML PO SUSR: 500.0000 mg | Freq: Three times a day (TID) | ORAL | Status: DC

## 2013-12-31 NOTE — Progress Notes (Signed)
Wants changed to amoxicillin.

## 2013-12-31 NOTE — Telephone Encounter (Signed)
Patient is returning call. States that she has not heard anything back regarding the medication that is not agreeing with her. She states that if you can write for amoxicillin she will come by to pick it up. Please return call.

## 2013-12-31 NOTE — Telephone Encounter (Signed)
Changed to amoxicillin 500 3 times a day, elixir per her choice

## 2014-08-23 ENCOUNTER — Ambulatory Visit (INDEPENDENT_AMBULATORY_CARE_PROVIDER_SITE_OTHER): Payer: BC Managed Care – PPO | Admitting: Physician Assistant

## 2014-08-23 VITALS — BP 128/76 | HR 70 | Temp 98.1°F | Resp 16 | Ht 61.5 in | Wt 174.0 lb

## 2014-08-23 DIAGNOSIS — J302 Other seasonal allergic rhinitis: Secondary | ICD-10-CM

## 2014-08-23 DIAGNOSIS — M2550 Pain in unspecified joint: Secondary | ICD-10-CM

## 2014-08-23 DIAGNOSIS — J209 Acute bronchitis, unspecified: Secondary | ICD-10-CM

## 2014-08-23 MED ORDER — IPRATROPIUM BROMIDE 0.03 % NA SOLN: 2.0000 | Freq: Two times a day (BID) | NASAL | Status: DC

## 2014-08-23 MED ORDER — BREATHERITE COLL SPACER ADULT MISC: 1.0000 | Freq: Three times a day (TID) | Status: DC | PRN

## 2014-08-23 MED ORDER — AZITHROMYCIN 250 MG PO TABS: ORAL_TABLET | ORAL | Status: DC

## 2014-08-23 MED ORDER — LEVALBUTEROL TARTRATE 45 MCG/ACT IN AERO: 1.0000 | INHALATION_SPRAY | Freq: Four times a day (QID) | RESPIRATORY_TRACT | Status: DC | PRN

## 2014-08-23 MED ORDER — CELECOXIB 100 MG PO CAPS: 100.0000 mg | ORAL_CAPSULE | Freq: Two times a day (BID) | ORAL | Status: DC

## 2014-08-23 MED ORDER — GUAIFENESIN ER 1200 MG PO TB12: 1.0000 | ORAL_TABLET | Freq: Two times a day (BID) | ORAL | Status: DC | PRN

## 2014-08-23 NOTE — Progress Notes (Signed)
Subjective:    Patient ID: [name] Young, female    DOB: 05-15-1951, 63 y.o.   MRN: [id number]  HPI Patient with PMH of bronchitis and childhood asthma presents with productive cough, chest congestion, SOB secondary to cough and med refill. Symptoms have been present for 7 days and she denies fever, but has been flushed. Last night when she was laying down she started wheezing and her cough kept her up most of the night. Has tried Cold-EZ, Halls, Advil, and herbal tea with minimal improvement. Denies sick contacts and says she has as flare every fall for the past 7 years when [place].  Pain in thumb is centralized at the base of thumb for the past year. Received prescription for pain last year, but never filled. Broke her thumb at 19 years while doing a handstand. Current pain is present when lifting or grabbing a lot. Pain is intermittent and achy. Has drank milk to relieve pain which works for her, but no other medication. Would like refill of celebrex even with complaint of dizziness with meloxicam.  Review of Systems  Constitutional: Negative for fever, chills, appetite change and fatigue.  HENT: Positive for congestion (chest), ear pain (fullness and pressure), sore throat (mild) and voice change (deeper). Negative for ear discharge, postnasal drip, rhinorrhea and sinus pressure.   Eyes: Negative for pain, discharge, redness and itching.  Respiratory: Positive for cough (productive with green phlegm), shortness of breath (secondary to cough) and wheezing (single episode while laying). Negative for chest tightness.   Cardiovascular: Negative for chest pain and palpitations.  Gastrointestinal: Negative for nausea and vomiting.  Musculoskeletal: Negative for myalgias, neck pain and neck stiffness.  Allergic/Immunologic: Positive for environmental allergies (seasonal) and food allergies (flour, vegetable oil).  Neurological: Negative for dizziness, light-headedness and headaches.   Hematological: Negative for adenopathy.       Objective:   Physical Exam  Constitutional: She is oriented to person, place, and time. She appears well-developed and well-nourished. No distress.  Blood pressure 128/76, pulse 70, temperature 98.1 F (36.7 C), temperature source Oral, resp. rate 16, height 5' 1.5" (1.562 m), weight 174 lb (78.926 kg), SpO2 96 %.   HENT:  Head: Normocephalic and atraumatic.  Right Ear: Ear canal normal. A middle ear effusion (minimal; serous) is present.  Left Ear: External ear and ear canal normal.  Nose: Mucosal edema and rhinorrhea (minimally pale turbinates) present. No sinus tenderness. Right sinus exhibits no maxillary sinus tenderness and no frontal sinus tenderness. Left sinus exhibits no maxillary sinus tenderness and no frontal sinus tenderness.  Mouth/Throat: Oropharynx is clear and moist. No oropharyngeal exudate.  Eyes: Conjunctivae and EOM are normal. Pupils are equal, round, and reactive to light. Right eye exhibits no discharge. Left eye exhibits no discharge. No scleral icterus.  Neck: Normal range of motion. Neck supple. No thyromegaly present.  Cardiovascular: Normal rate, regular rhythm and normal heart sounds.  Exam reveals no gallop and no friction rub.   Pulmonary/Chest: Effort normal. No accessory muscle usage. No tachypnea and no bradypnea. No respiratory distress. She has no decreased breath sounds. She has wheezes in the left upper field and the left middle field. She has rhonchi in the left upper field and the left middle field. She has no rales. She exhibits no tenderness.  Breath sounds improved with cough.   Abdominal: Soft. Bowel sounds are normal. There is no tenderness.  Musculoskeletal: Normal range of motion. She exhibits no edema or tenderness.  Lymphadenopathy:  She has no cervical adenopathy.  Neurological: She is alert and oriented to person, place, and time.  Skin: Skin is warm and dry. No rash noted. She is not  diaphoretic. No erythema. No pallor.        Assessment & Plan:  1. Acute bronchitis, unspecified organism Patient adverse to inhaler use due to feeling medicine has bad taste. Had in depth discussion about proper use and adding spacer. Gave spacer description and instructions. Patient states understanding. - azithromycin (ZITHROMAX) 250 MG tablet; Take 2 tabs PO x 1 dose, then 1 tab PO QD x 4 days  Dispense: 6 tablet; Refill: 0 - Guaifenesin (MUCINEX MAXIMUM STRENGTH) 1200 MG TB12; Take 1 tablet (1,200 mg total) by mouth every 12 (twelve) hours as needed.  Dispense: 14 tablet; Refill: 1 - levalbuterol (XOPENEX HFA) 45 MCG/ACT inhaler; Inhale 1 puff into the lungs every 6 (six) hours as needed for wheezing.  Dispense: 1 Inhaler; Refill: 12 - Spacer/Aero-Holding Chambers (BREATHERITE COLL SPACER ADULT) MISC; 1 Device by Does not apply route every 8 (eight) hours as needed.  Dispense: 1 each; Refill: 0 - Get plenty of fluids and rest.  2. Seasonal allergies - ipratropium (ATROVENT) 0.03 % nasal spray; Place 2 sprays into both nostrils 2 (two) times daily.  Dispense: 30 mL; Refill: 0 - Should add OTC zyrtec as change of season, esp fall and spring.  3. Joint pain Advised of medications relationship to meloxicam and to discontinue if lightheadedness occurs and is intolerable. - celecoxib (CELEBREX) 100 MG capsule; Take 1 capsule (100 mg total) by mouth 2 (two) times daily. Take with food  Dispense: 60 capsule; Refill: 1   Lamiah Marmol PA-C  [facility] 08/23/2014 2:19 PM

## 2014-08-23 NOTE — Patient Instructions (Signed)
1. May additionally use over the counter zyrtec for seasonal allergy symptoms in both the spring and fall.  Acute Bronchitis Bronchitis is inflammation of the airways that extend from the windpipe into the lungs (bronchi). The inflammation often causes mucus to develop. This leads to a cough, which is the most common symptom of bronchitis.  In acute bronchitis, the condition usually develops suddenly and goes away over time, usually in a couple weeks. Smoking, allergies, and asthma can make bronchitis worse. Repeated episodes of bronchitis may cause further lung problems.  CAUSES Acute bronchitis is most often caused by the same virus that causes a cold. The virus can spread from person to person (contagious) through coughing, sneezing, and touching contaminated objects. SIGNS AND SYMPTOMS   Cough.   Fever.   Coughing up mucus.   Body aches.   Chest congestion.   Chills.   Shortness of breath.   Sore throat.  DIAGNOSIS  Acute bronchitis is usually diagnosed through a physical exam. Your health care provider will also ask you questions about your medical history. Tests, such as chest X-rays, are sometimes done to rule out other conditions.  TREATMENT  Acute bronchitis usually goes away in a couple weeks. Oftentimes, no medical treatment is necessary. Medicines are sometimes given for relief of fever or cough. Antibiotic medicines are usually not needed but may be prescribed in certain situations. In some cases, an inhaler may be recommended to help reduce shortness of breath and control the cough. A cool mist vaporizer may also be used to help thin bronchial secretions and make it easier to clear the chest.  HOME CARE INSTRUCTIONS  Get plenty of rest.   Drink enough fluids to keep your urine clear or pale yellow (unless you have a medical condition that requires fluid restriction). Increasing fluids may help thin your respiratory secretions (sputum) and reduce chest  congestion, and it will prevent dehydration.   Take medicines only as directed by your health care provider.  If you were prescribed an antibiotic medicine, finish it all even if you start to feel better.  Avoid smoking and secondhand smoke. Exposure to cigarette smoke or irritating chemicals will make bronchitis worse. If you are a smoker, consider using nicotine gum or skin patches to help control withdrawal symptoms. Quitting smoking will help your lungs heal faster.   Reduce the chances of another bout of acute bronchitis by washing your hands frequently, avoiding people with cold symptoms, and trying not to touch your hands to your mouth, nose, or eyes.   Keep all follow-up visits as directed by your health care provider.  SEEK MEDICAL CARE IF: Your symptoms do not improve after 1 week of treatment.  SEEK IMMEDIATE MEDICAL CARE IF:  You develop an increased fever or chills.   You have chest pain.   You have severe shortness of breath.  You have bloody sputum.   You develop dehydration.  You faint or repeatedly feel like you are going to pass out.  You develop repeated vomiting.  You develop a severe headache. MAKE SURE YOU:   Understand these instructions.  Will watch your condition.  Will get help right away if you are not doing well or get worse. Document Released: 11/07/2004 Document Revised: 02/14/2014 Document Reviewed: 03/23/2013 [facility] Patient Information 2015 [employer] [place]. This information is not intended to replace advice given to you by your health care provider. Make sure you discuss any questions you have with your health care provider.

## 2014-08-23 NOTE — Progress Notes (Signed)
I was directly involved with the patient's care and agree with the physical, diagnosis and treatment plan.  

## 2014-08-26 ENCOUNTER — Telehealth: Payer: Self-pay

## 2014-08-26 NOTE — Telephone Encounter (Signed)
Pt came into office 11/10 and saw Benny LennertSarah Weber and was prescribed a azithromycin (ZITHROMAX) 250 MG tablet [[id number]][[id number]] , pt states that this has not worked for her, and she is still coughing up green stuff. Pt sates that due to high deductible on her insurance she can not come in to the office she states that if we look into her chart that we will see she always gets bronchitis at this time of the year, and would like to know if someone could prescribe her something else for her symptoms. Please advise pt

## 2014-08-29 ENCOUNTER — Telehealth: Payer: Self-pay | Admitting: Family Medicine

## 2014-08-29 ENCOUNTER — Ambulatory Visit (INDEPENDENT_AMBULATORY_CARE_PROVIDER_SITE_OTHER): Payer: BC Managed Care – PPO | Admitting: Emergency Medicine

## 2014-08-29 VITALS — BP 140/70 | HR 76 | Temp 98.5°F | Resp 18 | Ht 61.5 in | Wt 173.8 lb

## 2014-08-29 DIAGNOSIS — J209 Acute bronchitis, unspecified: Secondary | ICD-10-CM

## 2014-08-29 MED ORDER — BENZONATATE 200 MG PO CAPS: 200.0000 mg | ORAL_CAPSULE | Freq: Two times a day (BID) | ORAL | Status: DC | PRN

## 2014-08-29 MED ORDER — CLARITHROMYCIN 500 MG PO TABS: 500.0000 mg | ORAL_TABLET | Freq: Two times a day (BID) | ORAL | Status: DC

## 2014-08-29 NOTE — Telephone Encounter (Signed)
[name] FloodJeffrey R Greene, MD at 08/29/2014 8:45 AM     Status: Signed       Expand All Collapse All   Outside call by answering service.  Cough, seen 6 days ago and placed on Zpak. Still coughing, not better. Wants something else called in as gets bronchitis this time of year. Denied dyspnea, and later in phone call said she felt 100%better yesterday, then cough worsened. Discussed long acting nature of Zpak and that is still working for 10 days. She still asked for something different to be called in. Advised to RTC as sx's worsened overnight, and dicussed possible other conditions including pneumonia, and need for eval in office, ER, or other care provider. She declined this as has high deductible plan, and stated she would change doctors if we didn't call her something in. Advised her again that most appropriate care if she was worsening was to be [facility] - [facility] or other care provider, but soon after this - phone call disconnected. I tried calling her back twice, and she did not answer. I called answering service for them to relay message to be seen here, ER, or other provider if she was worsening as discussed prior (as it appeared she was not accepting my return calls). Will have clinical staff attempt to reach her again once we open to check status.

## 2014-08-29 NOTE — Progress Notes (Signed)
[facility] [address], [place] [id number] [phone]  Date:  08/29/2014   Name:  [name] NaCarol A Sammons   DOB:  Apr 09, 1951   MRN:  [id number]  PCP:  Tally DueGUEST, CHRIS WARREN, MD    Chief Complaint: Bronchitis   History of Present Illness:  [name] Young is a 63 y.o. very pleasant female patient who presents with the following:  Seen last Tuesday with bronchitis and treated with robitussin DM and a zpak.  Has not improved per patient Has cough productive of purulent sputum that is relentless Has some wheezing and exertional shortness of breath. No documented fever.    No nausea or vomiting.  No improvement with over the counter medications or other home remedies.  Denies other complaint or health concern today.   Patient Active Problem List   Diagnosis Date Noted  . Seasonal allergies 10/30/2012    Past Medical History  Diagnosis Date  . Hyperlipidemia   . Allergy   . Anemia   . Asthma   . Bronchitis     Past Surgical History  Procedure Laterality Date  . Cesarean section    . Tubal ligation      History  Substance Use Topics  . Smoking status: Never Smoker   . Smokeless tobacco: Never Used  . Alcohol Use: No    Family History  Problem Relation Age of Onset  . Diabetes Paternal Grandmother   . Diabetes Paternal Grandfather     Allergies  Allergen Reactions  . Codeine Itching  . Mobic [Meloxicam]     Dizzy/ lightheaded    Medication list has been reviewed and updated.  Current Outpatient Prescriptions on File Prior to Visit  Medication Sig Dispense Refill  . ALPRAZolam (XANAX) 0.5 MG tablet Take 1 tablet (0.5 mg total) by mouth at bedtime as needed for sleep. 30 tablet 0  . cefdinir (OMNICEF) 300 MG capsule Take 2 capsules (600 mg total) by mouth daily. 20 capsule 0  . celecoxib (CELEBREX) 100 MG capsule Take 1 capsule (100 mg total) by mouth 2 (two) times daily. Take with food 60 capsule 1  . Guaifenesin (MUCINEX MAXIMUM STRENGTH)  1200 MG TB12 Take 1 tablet (1,200 mg total) by mouth every 12 (twelve) hours as needed. 14 tablet 1  . ipratropium (ATROVENT) 0.03 % nasal spray Place 2 sprays into both nostrils 2 (two) times daily. 30 mL 0  . levalbuterol (XOPENEX HFA) 45 MCG/ACT inhaler Inhale 1 puff into the lungs every 6 (six) hours as needed for wheezing. 1 Inhaler 12  . promethazine-dextromethorphan (PROMETHAZINE-DM) 6.25-15 MG/5ML syrup Take 5 mLs by mouth at bedtime as needed for cough. 120 mL 0  . Spacer/Aero-Holding Chambers (BREATHERITE COLL SPACER ADULT) MISC 1 Device by Does not apply route every 8 (eight) hours as needed. 1 each 0  . azithromycin (ZITHROMAX) 250 MG tablet Take 2 tabs PO x 1 dose, then 1 tab PO QD x 4 days 6 tablet 0   No current facility-administered medications on file prior to visit.    Review of Systems:  As per HPI, otherwise negative.    Physical Examination: Filed Vitals:   08/29/14 1715  BP: 140/70  Pulse: 76  Temp: 98.5 F (36.9 C)  Resp: 18   Filed Vitals:   08/29/14 1715  Height: 5' 1.5" (1.562 m)  Weight: 173 lb 12.8 oz (78.835 kg)   Body mass index is 32.31 kg/(m^2). Ideal Body Weight: Weight in (lb) to have BMI =  25: 134.2  GEN: WDWN, ill appearing, Non-toxic, A & O x 3 HEENT: Atraumatic, Normocephalic. Neck supple. No masses, No LAD. Ears and Nose: No external deformity. CV: RRR, No M/G/R. No JVD. No thrill. No extra heart sounds. PULM: CTA B, scattered mild wheezes, no crackles, rhonchi. No retractions. No resp. distress. No accessory muscle use. ABD: S, NT, ND, +BS. No rebound. No HSM. EXTR: No c/c/e NEURO Normal gait.  PSYCH: Normally interactive. Conversant. Not depressed or anxious appearing.  Calm demeanor.    Assessment and Plan: Bronchitis biaxin tussionex Continue MDI   Signed,  Phillips OdorJeffery Anderson, MD

## 2014-08-29 NOTE — Patient Instructions (Signed)

## 2014-08-29 NOTE — Telephone Encounter (Signed)
Combined message with previous telephone message to address both issues at once.

## 2014-08-29 NOTE — Addendum Note (Signed)
Addended by: Vira AgarFARRINGTON, Jazmynn Pho L on: 08/29/2014 06:01 PM   Modules accepted: Orders

## 2014-08-29 NOTE — Telephone Encounter (Signed)
Outside call by answering service.  Cough, seen 6 days ago and placed on Zpak. Still coughing, not better.  Wants something else called in as gets bronchitis this time of year. Denied dyspnea, and later in phone call said she felt 100%better yesterday, then cough worsened. Discussed long acting nature of Zpak and that is still working for 10 days.  She still asked for something different to be called in.  Advised to RTC as sx's worsened overnight, and dicussed possible other conditions including pneumonia, and need for eval in office, ER, or other care provider. She declined this as has high deductible plan, and stated she would change doctors if we didn't call her something in.  Advised her again that most appropriate care if she was worsening was to be [facility] or other care provider, but soon after this - phone call disconnected. I tried calling her back twice, and she did not answer. I called answering service for them to relay message to be seen here, ER, or other provider if she was worsening as discussed prior (as it appeared she was not accepting my return calls). Will have clinical staff attempt to reach her again once we open to check status.

## 2014-08-29 NOTE — Telephone Encounter (Signed)
Tried to reach pt- unable to leave a message.

## 2014-09-03 NOTE — Telephone Encounter (Signed)
Pt saw Dr. Dareen PianoAnderson on the same day as this message. Called pt. She is on the mend.

## 2014-10-28 ENCOUNTER — Ambulatory Visit (INDEPENDENT_AMBULATORY_CARE_PROVIDER_SITE_OTHER): Payer: BLUE CROSS/BLUE SHIELD | Admitting: Family Medicine

## 2014-10-28 VITALS — BP 130/74 | HR 67 | Temp 97.3°F | Resp 18 | Ht 61.0 in | Wt 172.0 lb

## 2014-10-28 DIAGNOSIS — M2550 Pain in unspecified joint: Secondary | ICD-10-CM

## 2014-10-28 DIAGNOSIS — J209 Acute bronchitis, unspecified: Secondary | ICD-10-CM

## 2014-10-28 MED ORDER — PROMETHAZINE-DM 6.25-15 MG/5ML PO SYRP: 5.0000 mL | ORAL_SOLUTION | Freq: Every evening | ORAL | Status: DC | PRN

## 2014-10-28 MED ORDER — BENZONATATE 200 MG PO CAPS: 200.0000 mg | ORAL_CAPSULE | Freq: Two times a day (BID) | ORAL | Status: DC | PRN

## 2014-10-28 MED ORDER — CEFDINIR 300 MG PO CAPS: 600.0000 mg | ORAL_CAPSULE | Freq: Every day | ORAL | Status: DC

## 2014-10-28 MED ORDER — CELECOXIB 100 MG PO CAPS: 100.0000 mg | ORAL_CAPSULE | Freq: Two times a day (BID) | ORAL | Status: DC

## 2014-10-28 NOTE — Progress Notes (Signed)
Subjective: Patient is here complaining of a cough and congestion. She started bringing up a lot of green phlegm now. She brought a cup to show the green with a little blood tinging mucus she is bringing up. She does not smoke. He apparently been doing some construction in the house and the best from that has been irritating her. It is very difficult to get from her a history of exactly how long she's been having symptoms. She is probably having some of them ever since she was here in November. She is not febrile. Has a sedentary job at work. She knows her weight has gone up over the last few years, and is trying to get more exercise on that. She also is a restless sleeper which she was telling me about. She wants one medicine refilled for her joint aches, Celebrex.  Objective: TMs normal. Throat not erythematous. Neck supple with some small nodes pressure on the left. Chest clear. Heart regular without murmurs.  Assessment: Purulent bronchitis Pains  Plan: Omnicef, culture, Tessalon, refill the Celebrex

## 2014-10-28 NOTE — Patient Instructions (Signed)
Drink plenty of fluids and get enough rest  Take the medications as directed: Cefdinir (Omnicef) one twice daily, benzonatate and cough syrup as needed  Return if worse.

## 2014-11-05 ENCOUNTER — Other Ambulatory Visit: Payer: Self-pay | Admitting: Emergency Medicine

## 2014-11-05 MED ORDER — LEVOFLOXACIN 500 MG PO TABS: 500.0000 mg | ORAL_TABLET | Freq: Every day | ORAL | Status: AC

## 2015-03-24 ENCOUNTER — Encounter: Payer: Self-pay | Admitting: *Deleted

## 2015-05-24 ENCOUNTER — Ambulatory Visit (INDEPENDENT_AMBULATORY_CARE_PROVIDER_SITE_OTHER): Payer: 59 | Admitting: Emergency Medicine

## 2015-05-24 VITALS — BP 120/70 | HR 72 | Temp 99.1°F | Resp 16 | Ht 60.0 in | Wt 163.0 lb

## 2015-05-24 DIAGNOSIS — M2550 Pain in unspecified joint: Secondary | ICD-10-CM | POA: Diagnosis not present

## 2015-05-24 DIAGNOSIS — R682 Dry mouth, unspecified: Secondary | ICD-10-CM | POA: Diagnosis not present

## 2015-05-24 DIAGNOSIS — R5383 Other fatigue: Secondary | ICD-10-CM

## 2015-05-24 LAB — POCT GLYCOSYLATED HEMOGLOBIN (HGB A1C): HEMOGLOBIN A1C: 5.6

## 2015-05-24 MED ORDER — CELECOXIB 100 MG PO CAPS: 100.0000 mg | ORAL_CAPSULE | Freq: Two times a day (BID) | ORAL | Status: DC
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
## 2015-05-25 LAB — CBC WITH DIFFERENTIAL/PLATELET
Basophils Absolute: 0 10*3/uL (ref 0.0–0.1)
Basophils Relative: 0 % (ref 0–1)
Eosinophils Absolute: 0.1 10*3/uL (ref 0.0–0.7)
Eosinophils Relative: 2 % (ref 0–5)
HCT: 42.4 % (ref 36.0–46.0)
Hemoglobin: 14.9 g/dL (ref 12.0–15.0)
LYMPHS ABS: 2.3 10*3/uL (ref 0.7–4.0)
LYMPHS PCT: 44 % (ref 12–46)
MCH: 30.4 pg (ref 26.0–34.0)
MCHC: 35.1 g/dL (ref 30.0–36.0)
MCV: 86.5 fL (ref 78.0–100.0)
MONOS PCT: 6 % (ref 3–12)
MPV: 10 fL (ref 8.6–12.4)
Monocytes Absolute: 0.3 10*3/uL (ref 0.1–1.0)
NEUTROS ABS: 2.5 10*3/uL (ref 1.7–7.7)
Neutrophils Relative %: 48 % (ref 43–77)
Platelets: 254 10*3/uL (ref 150–400)
RBC: 4.9 MIL/uL (ref 3.87–5.11)
RDW: 13.2 % (ref 11.5–15.5)
WBC: 5.3 10*3/uL (ref 4.0–10.5)

## 2015-05-25 LAB — COMPREHENSIVE METABOLIC PANEL
ALK PHOS: 68 U/L (ref 33–130)
ALT: 14 U/L (ref 6–29)
AST: 17 U/L (ref 10–35)
Albumin: 4.5 g/dL (ref 3.6–5.1)
BILIRUBIN TOTAL: 2.4 mg/dL — AB (ref 0.2–1.2)
BUN: 13 mg/dL (ref 7–25)
CHLORIDE: 104 mmol/L (ref 98–110)
CO2: 27 mmol/L (ref 20–31)
Calcium: 9.4 mg/dL (ref 8.6–10.4)
Creat: 0.8 mg/dL (ref 0.50–0.99)
GLUCOSE: 87 mg/dL (ref 65–99)
POTASSIUM: 4.1 mmol/L (ref 3.5–5.3)
SODIUM: 140 mmol/L (ref 135–146)
TOTAL PROTEIN: 7.3 g/dL (ref 6.1–8.1)

## 2015-05-25 LAB — MAGNESIUM: MAGNESIUM: 2.1 mg/dL (ref 1.5–2.5)

## 2015-05-25 LAB — TSH: TSH: 2.221 u[IU]/mL (ref 0.350–4.500)

## 2015-05-25 LAB — CALCIUM: Calcium: 9.4 mg/dL (ref 8.6–10.4)

## 2015-05-26 LAB — HEAVY METALS, BLOOD
Arsenic: <3 mcg/L (ref <23)
Mercury, B: <4 mcg/L (ref <=10)

## 2015-07-26 ENCOUNTER — Telehealth: Payer: Self-pay | Admitting: Family Medicine

## 2015-07-28 ENCOUNTER — Emergency Department (HOSPITAL_COMMUNITY)
Admission: EM | Admit: 2015-07-28 | Discharge: 2015-07-29 | Disposition: A | Discharge status: Home / Self Care | Payer: 59 | Attending: Emergency Medicine | Admitting: Emergency Medicine

## 2015-07-28 DIAGNOSIS — N390 Urinary tract infection, site not specified: Secondary | ICD-10-CM | POA: Diagnosis not present

## 2015-07-28 DIAGNOSIS — F22 Delusional disorders: Secondary | ICD-10-CM | POA: Diagnosis not present

## 2015-07-28 DIAGNOSIS — Z862 Personal history of diseases of the blood and blood-forming organs and certain disorders involving the immune mechanism: Secondary | ICD-10-CM | POA: Insufficient documentation

## 2015-07-28 DIAGNOSIS — Z046 Encounter for general psychiatric examination, requested by authority: Secondary | ICD-10-CM | POA: Diagnosis present

## 2015-07-28 DIAGNOSIS — J45909 Unspecified asthma, uncomplicated: Secondary | ICD-10-CM | POA: Insufficient documentation

## 2015-07-28 DIAGNOSIS — Z8639 Personal history of other endocrine, nutritional and metabolic disease: Secondary | ICD-10-CM | POA: Insufficient documentation

## 2015-07-28 DIAGNOSIS — Z792 Long term (current) use of antibiotics: Secondary | ICD-10-CM | POA: Insufficient documentation
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
## 2015-07-28 NOTE — ED Notes (Signed)
[facility] Expected date:  Expected time:  Means of arrival:  Comments: Patient in room, currently in custody of [employer]

## 2015-07-29 ENCOUNTER — Encounter (HOSPITAL_COMMUNITY): Payer: Self-pay | Admitting: Emergency Medicine

## 2015-07-29 LAB — COMPREHENSIVE METABOLIC PANEL
ALBUMIN: 4.5 g/dL (ref 3.5–5.0)
ALT: 20 U/L (ref 14–54)
ANION GAP: 9 (ref 5–15)
AST: 25 U/L (ref 15–41)
Alkaline Phosphatase: 58 U/L (ref 38–126)
BUN: 18 mg/dL (ref 6–20)
CALCIUM: 9.6 mg/dL (ref 8.9–10.3)
CO2: 24 mmol/L (ref 22–32)
Chloride: 106 mmol/L (ref 101–111)
Creatinine, Ser: 0.85 mg/dL (ref 0.44–1.00)
GFR calc Af Amer: >60 mL/min (ref >60)
GFR calc non Af Amer: >60 mL/min (ref >60)
GLUCOSE: 125 mg/dL — AB (ref 65–99)
POTASSIUM: 3.6 mmol/L (ref 3.5–5.1)
SODIUM: 139 mmol/L (ref 135–145)
TOTAL PROTEIN: 7.7 g/dL (ref 6.5–8.1)
Total Bilirubin: 1.5 mg/dL — ABNORMAL HIGH (ref 0.3–1.2)

## 2015-07-29 LAB — URINALYSIS, ROUTINE W REFLEX MICROSCOPIC
Bilirubin Urine: NEGATIVE
GLUCOSE, UA: NEGATIVE mg/dL
Hgb urine dipstick: NEGATIVE
KETONES UR: NEGATIVE mg/dL
NITRITE: POSITIVE — AB
PH: 5 (ref 5.0–8.0)
Protein, ur: 30 mg/dL — AB
Specific Gravity, Urine: 1.024 (ref 1.005–1.030)
Urobilinogen, UA: 1 mg/dL (ref 0.0–1.0)

## 2015-07-29 LAB — CBC
HEMATOCRIT: 40.6 % (ref 36.0–46.0)
HEMOGLOBIN: 14.3 g/dL (ref 12.0–15.0)
MCH: 30.4 pg (ref 26.0–34.0)
MCHC: 35.2 g/dL (ref 30.0–36.0)
MCV: 86.4 fL (ref 78.0–100.0)
Platelets: 242 10*3/uL (ref 150–400)
RBC: 4.7 MIL/uL (ref 3.87–5.11)
RDW: 12.4 % (ref 11.5–15.5)
WBC: 7.3 10*3/uL (ref 4.0–10.5)

## 2015-07-29 LAB — URINE MICROSCOPIC-ADD ON

## 2015-07-29 LAB — RAPID URINE DRUG SCREEN, HOSP PERFORMED
Amphetamines: NOT DETECTED
BARBITURATES: NOT DETECTED
Benzodiazepines: NOT DETECTED
COCAINE: NOT DETECTED
OPIATES: NOT DETECTED
TETRAHYDROCANNABINOL: NOT DETECTED

## 2015-07-29 LAB — ETHANOL: Alcohol, Ethyl (B): <5 mg/dL (ref <5)

## 2015-07-29 LAB — ACETAMINOPHEN LEVEL

## 2015-07-29 LAB — SALICYLATE LEVEL

## 2015-07-29 MED ORDER — CEPHALEXIN 500 MG PO CAPS: 500.0000 mg | ORAL_CAPSULE | Freq: Four times a day (QID) | ORAL | Status: DC

## 2015-07-29 MED ORDER — FLUCONAZOLE 150 MG PO TABS: 150.0000 mg | ORAL_TABLET | Freq: Every day | ORAL | Status: AC

## 2015-07-29 NOTE — ED Notes (Signed)
Patient husband when to magistrate office, IVC was declined by magistrate at this time. Husband requested to see his wife, she does not want him in her room at this time.

## 2015-07-29 NOTE — BH Assessment (Addendum)
Assessment Note  [name] Young is an 64 y.o. female who presents to [facility] voluntarily with [employer]. Patient states that she has downsized to a two story town home and there has been many repairs since moving in. She states that "a month or so ago" there was a letter in the door that had a white powdery substance.  Patient states that she was concerned about this substance and later noticed it on other items in the home. Patient states that she found the white substance on her DVD player and it became "hot" while she was using this and she feels that it may be due to the substance that was found. Patient states that when she finds the substance it makes her heart beat fast and it makes her light headed and dizzy.  Patient states that she became concerned and took the items to the police station. She states that a letter came to the home and stated that there was something wrong with the water in her neighborhood and felt that may have caused this substance to appear in her home. Patient states that the police contacted her to tell her that she "broke the chain of custody" by bringing the items into the police department. She states "they said that they had to come to me to find it like it was." Patient states that she has also noticed this substance on her clothes when she washes them in the washing machine so she decided to wash her clothes in the bathtub. She states that tonight she went to rinse the clothes in the bathtub so that her husband could take a shower in the morning and the "clothes were hot to the touch." Patient states that she is not sure if it is the water or the soap but she "used the soap just yesterday and I was fine." Patient states that she called the police again due to her previous report being declined stating that she "broke the chain of custody." Patient states that when the police came into her home they spoke with her husband. She states that one of the officers pointed his finger to his head  and rolled around in a circle "saying that I'm crazy." Patient states that no one asked any questions about the clothes or the substance and she was placed in handcuffs. She states that she became upset and agitated. She states that the police officer "but his knee in my backside and treated me like I am a felon, I have never been in trouble in my life." She became tearful and states that she was "humiliated" by the police.  Patient states that she does not feel that someone is after her or someone specific is putting the white substance on her stuff but she would like to know where it is coming from because she feels that it may be harmful. Patient denies SI/HI and AVH.   Patient is alert and oriented x4. Patient is calm and cooperative at this time. Patient does not appear to be responding to internal stimuli. Patient speaks logically and coherently. Patient was assessed alone and answered all questions appropriately. Patient denies previous inpatient or outpatient psychiatric history and psychotropic medications. Patient denies being depressed and symptoms of depression. Patient states that her concentration and memory are intact. Patient states that her appetite is "good" and she eats "when I'm hungry, about two to three times a day." patient states that her sleeping habits have not changed and she sleeps about seven hours per night.  Patient denies use of drugs and alcohol and states that she has supportive siblings. Patient denies history of Si or self injurious behavior. Patient denies a history of HI or being violent towards others. Patient states that she has "never been in trouble" and does not have any pending charges or upcoming court dates. Patients speech was goal directed and she appears to have obsessive thoughts and paranoia related to this "white substance" found in her home.  ETOH <5 and UDS is clear at time of assessment.   Disposition pending UA results at this time.   Diagnosis:   Past  Medical History:  Past Medical History  Diagnosis Date  . Hyperlipidemia   . Allergy   . Anemia   . Asthma   . Bronchitis     Past Surgical History  Procedure Laterality Date  . Cesarean section    . Tubal ligation      Family History:  Family History  Problem Relation Age of Onset  . Diabetes Paternal Grandmother   . Diabetes Paternal Grandfather     Social History:  reports that she has never smoked. She has never used smokeless tobacco. She reports that she does not drink alcohol or use illicit drugs.  Additional Social History:  Alcohol / Drug Use Pain Medications: See PTA Prescriptions: See PTA Over the Counter: See PTA History of alcohol / drug use?: No history of alcohol / drug abuse  CIWA: CIWA-Ar BP: 162/85 mmHg Pulse Rate: 106 COWS:    Allergies:  Allergies  Allergen Reactions  . Codeine Itching  . Mobic [Meloxicam]     Dizzy/ lightheaded    Home Medications:  (Not in a hospital admission)  OB/GYN Status:  No LMP recorded. Patient is postmenopausal.  General Assessment Data Location of Assessment: [facility] TTS Assessment: In system Is this a Tele or Face-to-Face Assessment?: Face-to-Face Is this an Initial Assessment or a Re-assessment for this encounter?: Initial Assessment Marital status: Married [place] name: Ethelene Browns Is patient pregnant?: No Pregnancy Status: No Living Arrangements: Spouse/significant other Can pt return to current living arrangement?: Yes Admission Status: Voluntary Is patient capable of signing voluntary admission?: Yes Referral Source: Self/Family/Friend Insurance type: [facility]     Crisis Care Plan Living Arrangements: Spouse/significant other Name of Psychiatrist: None Name of Therapist: None  Education Status Is patient currently in school?: No Highest grade of school patient has completed: BA  Risk to self with the past 6 months Suicidal Ideation: No Has patient been a risk to self within the past 6 months prior  to admission? : No Suicidal Intent: No Has patient had any suicidal intent within the past 6 months prior to admission? : No Is patient at risk for suicide?: No Suicidal Plan?: No Has patient had any suicidal plan within the past 6 months prior to admission? : No Access to Means: No What has been your use of drugs/alcohol within the last 12 months?: Denies Previous Attempts/Gestures: No How many times?: 0 Other Self Harm Risks: Denies Triggers for Past Attempts: None known Intentional Self Injurious Behavior: None Family Suicide History: No Recent stressful life event(s): Other (Comment) (Moving to a townhome) Persecutory voices/beliefs?: No Depression: No Depression Symptoms:  (denies) Substance abuse history and/or treatment for substance abuse?: No Suicide prevention information given to non-admitted patients: Not applicable  Risk to Others within the past 6 months Homicidal Ideation: No Does patient have any lifetime risk of violence toward others beyond the six months prior to admission? : No Thoughts of  Harm to Others: No Current Homicidal Intent: No Current Homicidal Plan: No Access to Homicidal Means: No Identified Victim: Denies History of harm to others?: No Assessment of Violence: None Noted Violent Behavior Description: Denies Does patient have access to weapons?: No Criminal Charges Pending?: No Does patient have a court date: No Is patient on probation?: No  Psychosis Hallucinations: None noted Delusions: Unspecified  Mental Status Report Appearance/Hygiene: Unremarkable Eye Contact: Good Motor Activity: Freedom of movement Speech: Logical/coherent Level of Consciousness: Alert Mood: Pleasant Affect: Appropriate to circumstance Anxiety Level: Minimal Thought Processes: Coherent, Relevant Judgement: Unimpaired Orientation: Person, Place, Time, Situation, Appropriate for developmental age Obsessive Compulsive Thoughts/Behaviors: None  Cognitive  Functioning Concentration: Normal Memory: Recent Intact, Remote Intact IQ: Average Insight: Fair Impulse Control: Fair Appetite: Good Sleep: No Change Total Hours of Sleep: 7 Vegetative Symptoms: None  ADLScreening [facility] Assessment Services) Patient's cognitive ability adequate to safely complete daily activities?: Yes Patient able to express need for assistance with ADLs?: Yes Independently performs ADLs?: Yes (appropriate for developmental age)  Prior Inpatient Therapy Prior Inpatient Therapy: No Prior Therapy Dates: N/A Prior Therapy Facilty/Provider(s): N/A Reason for Treatment: N/A  Prior Outpatient Therapy Prior Outpatient Therapy: No Prior Therapy Dates: N/A Prior Therapy Facilty/Provider(s): N/A Reason for Treatment: N/A Does patient have an ACCT team?: No Does patient have Intensive In-House Services?  : No Does patient have [facility] services? : No Does patient have P4CC services?: No  ADL Screening (condition at time of admission) Patient's cognitive ability adequate to safely complete daily activities?: Yes Is the patient deaf or have difficulty hearing?: No Does the patient have difficulty seeing, even when wearing glasses/contacts?: No Does the patient have difficulty concentrating, remembering, or making decisions?: No Patient able to express need for assistance with ADLs?: Yes Does the patient have difficulty dressing or bathing?: No Independently performs ADLs?: Yes (appropriate for developmental age) Does the patient have difficulty walking or climbing stairs?: No Weakness of Legs: None Weakness of Arms/Hands: None  Home Assistive Devices/Equipment Home Assistive Devices/Equipment: None  Therapy Consults (therapy consults require a physician order) PT Evaluation Needed: No OT Evalulation Needed: No SLP Evaluation Needed: No Abuse/Neglect Assessment (Assessment to be complete while patient is alone) Physical Abuse: Denies Verbal Abuse: Denies Sexual  Abuse: Denies Exploitation of patient/patient's resources: Denies Self-Neglect: Denies Values / Beliefs Cultural Requests During Hospitalization: None Spiritual Requests During Hospitalization: None Consults Spiritual Care Consult Needed: No Social Work Consult Needed: No Merchant navy officerAdvance Directives (For Healthcare) Does patient have an advance directive?: No Would patient like information on creating an advanced directive?: No - patient declined information    Additional Information 1:1 In Past 12 Months?: No CIRT Risk: No Elopement Risk: No Does patient have medical clearance?: Yes     Disposition:  Disposition Initial Assessment Completed for this Encounter: Yes  On Site Evaluation by:   Reviewed with Physician:    Tyden Kann 07/29/2015 1:52 AM

## 2015-07-29 NOTE — ED Notes (Signed)
PA at bedside.

## 2015-07-29 NOTE — ED Provider Notes (Signed)
CSN: [id number]     Arrival date & time 07/28/15  2343 History   First MD Initiated Contact with Patient 07/29/15 0003     Chief Complaint  Patient presents with  . Medical Clearance     (Consider location/radiation/quality/duration/timing/severity/associated sxs/prior Treatment) HPI Comments: The patient arrives in restraints by [employer] while husband petitions for Involuntary Commitment. Per her husband, the patient has had increasingly paranoid behavior over the last one year. It started centered around food, that food whether from a restaurant or at home was tainted and this was an attempt by someone to cause harm to her and her family. Symptoms progressed to include an abnormal fear that people were breaking into her house and causing damage to things like water faucettes, and putting something into the water that caused her to feel lightheaded and ill. She now washes the clothes in the bathtub because she believes someone put a chemical in the washer that causes poisoning. The husband produces a letter the patient wrote to a church member that outlines her fear that her and her family were in danger because someone was trying to hurt them. The patient's history corroborates all the above. The patient is now contacting police for concerns regarding poisoning in letters that arrive at her house. The police were called to her house tonight by the patient out of concern for a white powder she found on a DVD player that she felt was poisonous. After speaking to the husband who was willing to seek IVC petition to get help for his wife, the police brought her to the hospital, at which time she became physically combative requiring restraint by police to avoid harm.    The history is provided by the patient. No language interpreter was used.    Past Medical History  Diagnosis Date  . Hyperlipidemia   . Allergy   . Anemia   . Asthma   . Bronchitis    Past Surgical History  Procedure Laterality Date   . Cesarean section    . Tubal ligation     Family History  Problem Relation Age of Onset  . Diabetes Paternal Grandmother   . Diabetes Paternal Grandfather    Social History  Substance Use Topics  . Smoking status: Never Smoker   . Smokeless tobacco: Never Used  . Alcohol Use: No   OB History    No data available     Review of Systems  Constitutional: Negative for fever and chills.  HENT: Negative.   Respiratory: Negative.   Cardiovascular: Negative.   Gastrointestinal: Negative.   Musculoskeletal: Negative.   Skin: Negative.   Neurological: Negative.       Allergies  Codeine and Mobic  Home Medications   Prior to Admission medications   Medication Sig Start Date End Date Taking? Authorizing Provider  cefdinir (OMNICEF) 300 MG capsule Take 2 capsules (600 mg total) by mouth daily. Patient not taking: Reported on 05/24/2015 10/28/14   Peyton Najjar, MD  celecoxib (CELEBREX) 100 MG capsule Take 1 capsule (100 mg total) by mouth 2 (two) times daily. Take with food 05/24/15   Carmelina Dane, MD   BP 162/85 mmHg  Pulse 106  Temp(Src) 98 F (36.7 C) (Oral)  Resp 20  Ht  (1.549 m)  Wt 162 lb (73.483 kg)  BMI 30.63 kg/m2  SpO2 97% Physical Exam  Constitutional: She is oriented to person, place, and time. She appears well-developed and well-nourished.  HENT:  Head: Normocephalic.  Neck:  Normal range of motion. Neck supple.  Cardiovascular: Normal rate and regular rhythm.   Pulmonary/Chest: Effort normal and breath sounds normal.  Abdominal: Soft. Bowel sounds are normal. There is no tenderness. There is no rebound and no guarding.  Musculoskeletal: Normal range of motion.  Neurological: She is alert and oriented to person, place, and time.  Skin: Skin is warm and dry. No rash noted.  Psychiatric: Her speech is normal. Thought content is paranoid and delusional.    ED Course  Procedures (including critical care time) Labs Review Labs Reviewed   COMPREHENSIVE METABOLIC PANEL - Abnormal; Notable for the following:    Glucose, Bld 125 (*)    Total Bilirubin 1.5 (*)    All other components within normal limits  ACETAMINOPHEN LEVEL - Abnormal; Notable for the following:    Acetaminophen (Tylenol), Serum <10 (*)    All other components within normal limits  ETHANOL  SALICYLATE LEVEL  CBC  URINE RAPID DRUG SCREEN, HOSP PERFORMED   Results for orders placed or performed during the hospital encounter of 07/28/15  Comprehensive metabolic panel  Result Value Ref Range   Sodium 139 135 - 145 mmol/L   Potassium 3.6 3.5 - 5.1 mmol/L   Chloride 106 101 - 111 mmol/L   CO2 24 22 - 32 mmol/L   Glucose, Bld 125 (H) 65 - 99 mg/dL   BUN 18 6 - 20 mg/dL   Creatinine, Ser 1.610.85 0.44 - 1.00 mg/dL   Calcium 9.6 8.9 - 09.610.3 mg/dL   Total Protein 7.7 6.5 - 8.1 g/dL   Albumin 4.5 3.5 - 5.0 g/dL   AST 25 15 - 41 U/L   ALT 20 14 - 54 U/L   Alkaline Phosphatase 58 38 - 126 U/L   Total Bilirubin 1.5 (H) 0.3 - 1.2 mg/dL   GFR calc non Af Amer >60 >60 mL/min   GFR calc Af Amer >60 >60 mL/min   Anion gap 9 5 - 15  Ethanol (ETOH)  Result Value Ref Range   Alcohol, Ethyl (B) <5 <5 mg/dL  Salicylate level  Result Value Ref Range   Salicylate Lvl <4.0 2.8 - 30.0 mg/dL  Acetaminophen level  Result Value Ref Range   Acetaminophen (Tylenol), Serum <10 (L) 10 - 30 ug/mL  CBC  Result Value Ref Range   WBC 7.3 4.0 - 10.5 K/uL   RBC 4.70 3.87 - 5.11 MIL/uL   Hemoglobin 14.3 12.0 - 15.0 g/dL   HCT 04.540.6 40.936.0 - 81.146.0 %   MCV 86.4 78.0 - 100.0 fL   MCH 30.4 26.0 - 34.0 pg   MCHC 35.2 30.0 - 36.0 g/dL   RDW 91.412.4 78.211.5 - 95.615.5 %   Platelets 242 150 - 400 K/uL  Urine rapid drug screen (hosp performed) (Not at [facility])  Result Value Ref Range   Opiates NONE DETECTED NONE DETECTED   Cocaine NONE DETECTED NONE DETECTED   Benzodiazepines NONE DETECTED NONE DETECTED   Amphetamines NONE DETECTED NONE DETECTED   Tetrahydrocannabinol NONE DETECTED NONE DETECTED    Barbiturates NONE DETECTED NONE DETECTED  Urinalysis, Routine w reflex microscopic (not [facility])  Result Value Ref Range   Color, Urine YELLOW YELLOW   APPearance CLOUDY (A) CLEAR   Specific Gravity, Urine 1.024 1.005 - 1.030   pH 5.0 5.0 - 8.0   Glucose, UA NEGATIVE NEGATIVE mg/dL   Hgb urine dipstick NEGATIVE NEGATIVE   Bilirubin Urine NEGATIVE NEGATIVE   Ketones, ur NEGATIVE NEGATIVE mg/dL   Protein, ur  30 (A) NEGATIVE mg/dL   Urobilinogen, UA 1.0 0.0 - 1.0 mg/dL   Nitrite POSITIVE (A) NEGATIVE   Leukocytes, UA SMALL (A) NEGATIVE  Urine microscopic-add on  Result Value Ref Range   Squamous Epithelial / LPF FEW (A) RARE   WBC, UA 7-10 <3 WBC/hpf   Bacteria, UA MANY (A) RARE   Crystals CA OXALATE CRYSTALS (A) NEGATIVE    Imaging Review No results found. I have personally reviewed and evaluated these images and lab results as part of my medical decision-making.   EKG Interpretation None      MDM   Final diagnoses:  None    1. Paranoia 2. UTI  The patient was initially combative and angry on arrival, but became calm and cooperative during encounter. TTS consultation obtained and it is felt that the patient does not meet inpatient criteria and the recommendation is for outpatient psychiatric evaluation. Discussed with the patient and her husband who are comfortable with discharge home. Resources provided.     Elpidio Anis, PA-C 07/30/15 1610  Rolland Porter, MD 08/08/15 [phone]

## 2015-07-29 NOTE — [facility] Counselor (Signed)
  Spoke with patient and her husband about outpatient psychiatric treatment. Provided patient with a card to contact mobile crisis. Discussed the benefits of mobile crisis with the patient and her husband. Also informed the patient and husband that they are able to contact insurance carrier to see what psychiatrists are covered by their plan. Encouraged patient to follow up with a psychiatrist as soon as possible for an initial appointment. Patient and her husband denies any further questions or concerns at this time.    Davina PokeJoVea Michail Boyte, LCSW Therapeutic Triage Specialist  [facility] 07/29/2015 5:38 AM

## 2015-07-29 NOTE — BH Assessment (Signed)
Consulted with Hulan FessIjeoma Nwaeze, NP who recommends a UA be collected and resulted before final disposition. Informed EDPA and patients nurse.   Davina PokeJoVea Franciscojavier Wronski, LCSW Therapeutic Triage Specialist [facility] 07/29/2015 2:22 AM

## 2015-07-29 NOTE — BH Assessment (Signed)
Spoke with the patient and she stated that her husband may have "went for a walk" unable to retrieve the letter due to husband being available. Will attempt to speak with husband at a later time.  Patient has a bruise on her left arm stating that the police "threw" her on the "banister" of her stairs. Informed patients nurse that patient was complaining of arm pain in her shoulder.   Davina PokeJoVea Elide Stalzer, LCSW Therapeutic Triage Specialist [facility] 07/29/2015 4:54 AM

## 2015-07-29 NOTE — BH Assessment (Signed)
EDPA states that she spoke with the patient and will start her on medication for her UTI and ordered a culture. EDPA states that patients husband has a letter that patient wrote. Will inform NP of updates.   Davina PokeJoVea Nevin Kozuch, LCSW Therapeutic Triage Specialist [facility] 07/29/2015 3:34 AM

## 2015-07-29 NOTE — Discharge Instructions (Signed)
Urinary Tract Infection Urinary tract infections (UTIs) can develop anywhere along your urinary tract. Your urinary tract is your body's drainage system for removing wastes and extra water. Your urinary tract includes two kidneys, two ureters, a bladder, and a urethra. Your kidneys are a pair of bean-shaped organs. Each kidney is about the size of your fist. They are located below your ribs, one on each side of your spine. CAUSES Infections are caused by microbes, which are microscopic organisms, including fungi, viruses, and bacteria. These organisms are so small that they can only be seen through a microscope. Bacteria are the microbes that most commonly cause UTIs. SYMPTOMS  Symptoms of UTIs may vary by age and gender of the patient and by the location of the infection. Symptoms in young women typically include a frequent and intense urge to urinate and a painful, burning feeling in the bladder or urethra during urination. Older women and men are more likely to be tired, shaky, and weak and have muscle aches and abdominal pain. A fever may mean the infection is in your kidneys. Other symptoms of a kidney infection include pain in your back or sides below the ribs, nausea, and vomiting. DIAGNOSIS To diagnose a UTI, your caregiver will ask you about your symptoms. Your caregiver will also ask you to provide a urine sample. The urine sample will be tested for bacteria and white blood cells. White blood cells are made by your body to help fight infection. TREATMENT  Typically, UTIs can be treated with medication. Because most UTIs are caused by a bacterial infection, they usually can be treated with the use of antibiotics. The choice of antibiotic and length of treatment depend on your symptoms and the type of bacteria causing your infection. HOME CARE INSTRUCTIONS  If you were prescribed antibiotics, take them exactly as your caregiver instructs you. Finish the medication even if you feel better after  you have only taken some of the medication.  Drink enough water and fluids to keep your urine clear or pale yellow.  Avoid caffeine, tea, and carbonated beverages. They tend to irritate your bladder.  Empty your bladder often. Avoid holding urine for long periods of time.  Empty your bladder before and after sexual intercourse.  After a bowel movement, women should cleanse from front to back. Use each tissue only once. SEEK MEDICAL CARE IF:   You have back pain.  You develop a fever.  Your symptoms do not begin to resolve within 3 days. SEEK IMMEDIATE MEDICAL CARE IF:   You have severe back pain or lower abdominal pain.  You develop chills.  You have nausea or vomiting.  You have continued burning or discomfort with urination. MAKE SURE YOU:   Understand these instructions.  Will watch your condition.  Will get help right away if you are not doing well or get worse.   This information is not intended to replace advice given to you by your health care provider. Make sure you discuss any questions you have with your health care provider.   Document Released: 07/10/2005 Document Revised: 06/21/2015 Document Reviewed: 11/08/2011 Elsevier Interactive Patient Education 2016 Elsevier Inc.  Outpatient Psychiatry and Counseling  [facility]: Mobile Crisis Management 24 hours:  [phone]  [facility] sliding scale fee and walk in schedule: M-F 8am-12pm/1pm-3pm [address]  [place], [place] [address] [phone]  [facility] [address] [place], [place] [address] [phone]  [facility] new patient  walk-in appointments available Monday - Friday 8am -3pm.          [address] [place], [place] [address] [phone] or crisis line- [phone]  [facility] Out[facility]/ Intensive Outpatient Therapy Program [address] [place], [place] [address] [phone]  [facility]                  Crisis Services      [id number]     [place], [place] [address]                 [facility]   [facility] [phone]. [address] [place], [place] [address]   [employer] of Care          [address]  [place], [place] [address]       [phone]  [facility] [address] [place], [place] [address] [phone]  [facility]    [address]    [place], [place] [address]     [phone]       Andee Poles, MD     [address]     [place] [address]     [phone]       [facility] [address] [place] [address]  [facility]     [address] [place], [place]      [phone]       [facility] Eulogio Ditch, MD [address] [place], [place] [address] [phone]  [facility]     [address]     [place], [place] [address]     [phone]       [facility] [address] [place], [place] [address] [phone] Resources for Temporary Residential Assistance/Crisis Centers Services include: laundry, barbering, support groups, case management, phone  & computer access, showers, AA/NA mtgs, mental health/substance abuse nurse, job skills class, disability information, [place] assistance, spiritual classes, etc.

## 2015-07-29 NOTE — ED Notes (Signed)
Delay in d/c, TTS and psych NP at bedside speaking to patient.

## 2015-07-29 NOTE — ED Notes (Signed)
Patient states in April she and her husband moved into a new apartment. After that she began experiencing a variety of "strange" things. Items in the home would break frequently without reason and would be more difficult to repair than they should have been. Patient states she noticed recently that there was a white powder in here DVD player when she opened it, that it took her breath away. She states mail has been placed in her door, instead of the mailbox, with a similar substance in it. Patient states she has also discovered a "caustic" substance on her laundry when she took it from the washing machine. Patient states her husband does not believe her and she does not wish to be here. Patient handcuffs were removed by [employer] after being requested to do so by this nurse. Patient calm and cooperative at this time.

## 2015-08-01 LAB — URINE CULTURE

## 2015-08-02 ENCOUNTER — Telehealth (HOSPITAL_COMMUNITY): Payer: Self-pay

## 2015-08-02 NOTE — Telephone Encounter (Signed)
Post ED Visit - Positive Culture Follow-up  Culture report reviewed by antimicrobial stewardship pharmacist:  []  Celedonio MiyamotoJeremy Frens, Pharm.D., BCPS []  Georgina PillionElizabeth Martin, Pharm.D., BCPS []  HooverMinh Pham, VermontPharm.D., BCPS, AAHIVP []  Estella HuskMichelle Turner, Pharm.D., BCPS, AAHIVP []  Westfirristy Reyes, [address] BoulevardPharm.D. [x]  Mendel CorningNick G.   Tennis Mustassie Stewart, Pharm.D.  Positive urine culture Treated withcephalexin , organism sensitive to the same and no further patient follow-up is required at this time.  Ashley JacobsFesterman, Saifullah Jolley C 08/02/2015, 9:27 AM

## 2015-10-24 ENCOUNTER — Other Ambulatory Visit: Payer: Self-pay | Admitting: Family Medicine

## 2015-12-15 ENCOUNTER — Telehealth: Payer: Self-pay

## 2015-12-15 ENCOUNTER — Ambulatory Visit (INDEPENDENT_AMBULATORY_CARE_PROVIDER_SITE_OTHER): Payer: 59 | Admitting: Urgent Care

## 2015-12-15 ENCOUNTER — Encounter: Payer: Self-pay | Admitting: Internal Medicine

## 2015-12-15 VITALS — BP 122/80 | HR 80 | Temp 99.0°F | Resp 16 | Ht 61.0 in | Wt 171.8 lb

## 2015-12-15 DIAGNOSIS — R21 Rash and other nonspecific skin eruption: Secondary | ICD-10-CM

## 2015-12-15 DIAGNOSIS — J329 Chronic sinusitis, unspecified: Secondary | ICD-10-CM

## 2015-12-15 DIAGNOSIS — R0982 Postnasal drip: Secondary | ICD-10-CM

## 2015-12-15 DIAGNOSIS — B354 Tinea corporis: Secondary | ICD-10-CM

## 2015-12-15 DIAGNOSIS — M2550 Pain in unspecified joint: Secondary | ICD-10-CM | POA: Diagnosis not present

## 2015-12-15 DIAGNOSIS — R05 Cough: Secondary | ICD-10-CM | POA: Diagnosis not present

## 2015-12-15 DIAGNOSIS — R059 Cough, unspecified: Secondary | ICD-10-CM

## 2015-12-15 MED ORDER — HYDROCODONE-HOMATROPINE 5-1.5 MG/5ML PO SYRP: 5.0000 mL | ORAL_SOLUTION | Freq: Every evening | ORAL | Status: DC | PRN

## 2015-12-15 MED ORDER — KETOCONAZOLE 2 % EX CREA: 1.0000 "application " | TOPICAL_CREAM | Freq: Every day | CUTANEOUS | Status: DC

## 2015-12-15 MED ORDER — CELECOXIB 100 MG PO CAPS: ORAL_CAPSULE | ORAL | Status: DC

## 2015-12-15 MED ORDER — BENZONATATE 100 MG PO CAPS: 100.0000 mg | ORAL_CAPSULE | Freq: Three times a day (TID) | ORAL | Status: DC | PRN

## 2015-12-15 MED ORDER — PSEUDOEPHEDRINE HCL ER 120 MG PO TB12: 120.0000 mg | ORAL_TABLET | Freq: Two times a day (BID) | ORAL | Status: DC

## 2015-12-15 MED ORDER — CETIRIZINE HCL 10 MG PO TABS: 10.0000 mg | ORAL_TABLET | Freq: Every day | ORAL | Status: DC

## 2015-12-15 MED ORDER — AZITHROMYCIN 250 MG PO TABS: ORAL_TABLET | ORAL | Status: DC

## 2015-12-15 NOTE — Patient Instructions (Signed)
Allergies An allergy is an abnormal reaction to a substance by the body's defense system (immune system). Allergies can develop at any age. WHAT CAUSES ALLERGIES? An allergic reaction happens when the immune system mistakenly reacts to a normally harmless substance, called an allergen, as if it were harmful. The immune system releases antibodies to fight the substance. Antibodies eventually release a chemical called histamine into the bloodstream. The release of histamine is meant to protect the body from infection, but it also causes discomfort. An allergic reaction can be triggered by:  Eating an allergen.  Inhaling an allergen.  Touching an allergen. WHAT TYPES OF ALLERGIES ARE THERE? There are many types of allergies. Common types include:  Seasonal allergies. People with this type of allergy are usually allergic to substances that are only present during certain seasons, such as molds and pollens.  Food allergies.  Drug allergies.  Insect allergies.  Animal dander allergies. WHAT ARE SYMPTOMS OF ALLERGIES? Possible allergy symptoms include:  Swelling of the lips, face, tongue, mouth, or throat.  Sneezing, coughing, or wheezing.  Nasal congestion.  Tingling in the mouth.  Rash.  Itching.  Itchy, red, swollen areas of skin (hives).  Watery eyes.  Vomiting.  Diarrhea.  Dizziness.  Lightheadedness.  Fainting.  Trouble breathing or swallowing.  Chest tightness.  Rapid heartbeat. HOW ARE ALLERGIES DIAGNOSED? Allergies are diagnosed with a medical and family history and one or more of the following:  Skin tests.  Blood tests.  A food diary. A food diary is a record of all the foods and drinks you have in a day and of all the symptoms you experience.  The results of an elimination diet. An elimination diet involves eliminating foods from your diet and then adding them back in one by one to find out if a certain food causes an allergic reaction. HOW ARE  ALLERGIES TREATED? There is no cure for allergies, but allergic reactions can be treated with medicine. Severe reactions usually need to be treated at a hospital. HOW CAN REACTIONS BE PREVENTED? The best way to prevent an allergic reaction is by avoiding the substance you are allergic to. Allergy shots and medicines can also help prevent reactions in some cases. People with severe allergic reactions may be able to prevent a life-threatening reaction called anaphylaxis with a medicine given right after exposure to the allergen.   This information is not intended to replace advice given to you by your health care provider. Make sure you discuss any questions you have with your health care provider.   Document Released: 12/24/2002 Document Revised: 10/21/2014 Document Reviewed: 07/12/2014 Elsevier Interactive Patient Education 2016 Elsevier Inc.     Body Ringworm Ringworm (tinea corporis) is a fungal infection of the skin on the body. This infection is not caused by worms, but is actually caused by a fungus. Fungus normally lives on the top of your skin and can be useful. However, in the case of ringworms, the fungus grows out of control and causes a skin infection. It can involve any area of skin on the body and can spread easily from one person to another (contagious). Ringworm is a common problem for children, but it can affect adults as well. Ringworm is also often found in athletes, especially wrestlers who share equipment and mats.  CAUSES  Ringworm of the body is caused by a fungus called dermatophyte. It can spread by:  Touchingother people who are infected.  Touchinginfected pets.  Touching or sharingobjects that have been in contact  with the infected person or pet (hats, combs, towels, clothing, sports equipment). SYMPTOMS   Itchy, raised red spots and bumps on the skin.  Ring-shaped rash.  Redness near the border of the rash with a clear center.  Dry and scaly skin on or  around the rash. Not every person develops a ring-shaped rash. Some develop only the red, scaly patches. DIAGNOSIS  Most often, ringworm can be diagnosed by performing a skin exam. Your caregiver may choose to take a skin scraping from the affected area. The sample will be examined under the microscope to see if the fungus is present.  TREATMENT  Body ringworm may be treated with a topical antifungal cream or ointment. Sometimes, an antifungal shampoo that can be used on your body is prescribed. You may be prescribed antifungal medicines to take by mouth if your ringworm is severe, keeps coming back, or lasts a long time.  HOME CARE INSTRUCTIONS   Only take over-the-counter or prescription medicines as directed by your caregiver.  Wash the infected area and dry it completely before applying yourcream or ointment.  When using antifungal shampoo to treat the ringworm, leave the shampoo on the body for 3-5 minutes before rinsing.   Wear loose clothing to stop clothes from rubbing and irritating the rash.  Wash or change your bed sheets every night while you have the rash.  Have your pet treated by your veterinarian if it has the same infection. To prevent ringworm:   Practice good hygiene.  Wear sandals or shoes in public places and showers.  Do not share personal items with others.  Avoid touching red patches of skin on other people.  Avoid touching pets that have bald spots or wash your hands after doing so. SEEK MEDICAL CARE IF:   Your rash continues to spread after 7 days of treatment.  Your rash is not gone in 4 weeks.  The area around your rash becomes red, warm, tender, and swollen.   This information is not intended to replace advice given to you by your health care provider. Make sure you discuss any questions you have with your health care provider.   Document Released: 09/27/2000 Document Revised: 06/24/2012 Document Reviewed: 04/13/2012 Elsevier Interactive  Patient Education [employer]

## 2015-12-15 NOTE — Progress Notes (Signed)
    MRN: [id number] DOB: [birth year]  Subjective:   [name] Young  is a 65 y.o. female presenting for chief complaint of Medication Refill; Rash; chest congestion; and other  Refill - takes celecoxib for bilateral thumb pain. Patient takes this for arthritis, feels thumb pain daily. 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 Her x-rays from 2014 for her thumb were negative. She does not want to do any x-rays and is not interested in blood work.  Rash - Reports ~10 month history of rash over her left forearm. Rash is itchy and irritating. She states that she moved into a new house 01/2015. She had to use harsh chemicals to clean the house. She has tried Monistat.  Chest congestion - reports that she gets bronchitis in Fall and Spring every year. Reports ~2 week history of productive cough, has chest congestion, shob. Has a history of childhood asthma. Denies smoking cigarettes.  [name] Young has a current medication list which includes the following prescription(s): celecoxib. Also is allergic to codeine and mobic.  [name] Young  has a past medical history of Hyperlipidemia; Allergy; Anemia; Asthma; and Bronchitis. Also  has past surgical history that includes Cesarean section and Tubal ligation.  Objective:   Vitals: BP 122/80 mmHg  Pulse 80  Temp(Src) 99 F (37.2 C) (Oral)  Resp 16  Ht 5\' 1"  (1.549 m)  Wt 171 lb 12.8 oz (77.928 kg)  BMI 32.48 kg/m2  SpO2 96%  Physical Exam  Constitutional: She is oriented to person, place, and time. She appears well-developed and well-nourished.  HENT:  TM's intact bilaterally, no effusions or erythema. Nasal turbinates pink and moist. No sinus tenderness. Significant postnasal drip present, without oropharyngeal exudates, erythema or abscesses.   Cardiovascular: Normal rate, regular rhythm and intact distal pulses.  Exam reveals no gallop and no friction rub.   No murmur heard. Pulmonary/Chest: No respiratory distress.  She has no wheezes. She has no rales.  Neurological: She is alert and oriented to person, place, and time.  Skin: Skin is warm and dry.       Assessment and Plan :   1. Joint pain - Refilled her celecoxib since patient experiences significant relief with this. Reviewed her x-rays and labs from previous work up. She refused labs and x-rays today.  2. Post-nasal drainage 3. Cough - Likely due to uncontrolled allergies but patient strongly believes it is bronchitis since "she gets this every fall and spring". I counseled her on allergies. Advised that she start Zyrtec and Sudafed. I agreed to give patient a script for azithromycin if she would try oral antihistamine. She is okay to fill this in 1 week if no improvement.  4. Rash and nonspecific skin eruption 5. Tinea corporis - Start ketoconazole for 2 weeks.  Wallis BambergMario Gee Habig, PA-C [facility] [phone] 12/15/2015 3:04 PM

## 2015-12-15 NOTE — Addendum Note (Signed)
Addended by: Thelma BargeICHARDSON, SHEKETIA D on: 12/15/2015 04:21 PM   Modules accepted: Orders

## 2015-12-22 ENCOUNTER — Ambulatory Visit (INDEPENDENT_AMBULATORY_CARE_PROVIDER_SITE_OTHER): Payer: Commercial Managed Care - HMO

## 2015-12-22 ENCOUNTER — Ambulatory Visit (INDEPENDENT_AMBULATORY_CARE_PROVIDER_SITE_OTHER): Payer: Commercial Managed Care - HMO | Admitting: Family Medicine

## 2015-12-22 VITALS — BP 118/76 | HR 70 | Temp 98.4°F | Resp 16 | Ht 61.0 in | Wt 171.0 lb

## 2015-12-22 DIAGNOSIS — J9801 Acute bronchospasm: Secondary | ICD-10-CM | POA: Diagnosis not present

## 2015-12-22 DIAGNOSIS — J301 Allergic rhinitis due to pollen: Secondary | ICD-10-CM

## 2015-12-22 DIAGNOSIS — J988 Other specified respiratory disorders: Secondary | ICD-10-CM

## 2015-12-22 DIAGNOSIS — B354 Tinea corporis: Secondary | ICD-10-CM

## 2015-12-22 DIAGNOSIS — J22 Unspecified acute lower respiratory infection: Secondary | ICD-10-CM

## 2015-12-22 LAB — POCT CBC
Granulocyte percent: 52.6 %G (ref 37–80)
HCT, POC: 40.5 % (ref 37.7–47.9)
HEMOGLOBIN: 14.1 g/dL (ref 12.2–16.2)
LYMPH, POC: 2.1 (ref 0.6–3.4)
MCH, POC: 30.3 pg (ref 27–31.2)
MCHC: 34.8 g/dL (ref 31.8–35.4)
MCV: 87 fL (ref 80–97)
MID (CBC): 0.3 (ref 0–0.9)
MPV: 7.1 fL (ref 0–99.8)
POC Granulocyte: 2.7 (ref 2–6.9)
POC LYMPH %: 40.6 % (ref 10–50)
POC MID %: 6.8 % (ref 0–12)
Platelet Count, POC: 207 10*3/uL (ref 142–424)
RBC: 4.65 M/uL (ref 4.04–5.48)
RDW, POC: 12.2 %
WBC: 5.1 10*3/uL (ref 4.6–10.2)

## 2015-12-22 MED ORDER — CLARITHROMYCIN 500 MG PO TABS: 500.0000 mg | ORAL_TABLET | Freq: Two times a day (BID) | ORAL | Status: DC

## 2015-12-22 MED ORDER — FLUTICASONE PROPIONATE 50 MCG/ACT NA SUSP: 2.0000 | Freq: Every day | NASAL | Status: DC

## 2015-12-22 MED ORDER — ALBUTEROL SULFATE (2.5 MG/3ML) 0.083% IN NEBU
2.5000 mg | INHALATION_SOLUTION | Freq: Once | RESPIRATORY_TRACT | Status: AC
  Administered 2015-12-22: 2.5 mg via RESPIRATORY_TRACT

## 2015-12-22 NOTE — Progress Notes (Deleted)
   [name] ArcherCarol Young  MRN: [id number] DOB: January 19, 1951  Subjective:  Pt presents to clinic   Patient Active Problem List   Diagnosis Date Noted  . Seasonal allergies 10/30/2012    Current Outpatient Prescriptions on File Prior to Visit  Medication Sig Dispense Refill  . benzonatate (TESSALON) 100 MG capsule Take 1-2 capsules (100-200 mg total) by mouth 3 (three) times daily as needed for cough. 40 capsule 0  . celecoxib (CELEBREX) 100 MG capsule TAKE ONE CAPSULE BY MOUTH TWICE DAILY WITH FOOD 60 capsule 3  . cetirizine (ZYRTEC) 10 MG tablet Take 1 tablet (10 mg total) by mouth daily. 30 tablet 11  . HYDROcodone-homatropine (HYCODAN) 5-1.5 MG/5ML syrup Take 5 mLs by mouth at bedtime as needed. 120 mL 0  . ketoconazole (NIZORAL) 2 % cream Apply 1 application topically daily. 15 g 0  . pseudoephedrine (SUDAFED 12 HOUR) 120 MG 12 hr tablet Take 1 tablet (120 mg total) by mouth 2 (two) times daily. 30 tablet 3   No current facility-administered medications on file prior to visit.    Allergies  Allergen Reactions  . Codeine Itching  . Mobic [Meloxicam]     Dizzy/ lightheaded    Review of Systems Objective:  BP 118/76 mmHg  Pulse 70  Temp(Src) 98.4 F (36.9 C) (Oral)  Resp 16  Ht 5\' 1"  (1.549 m)  Wt 171 lb (77.565 kg)  BMI 32.33 kg/m2  SpO2 96%  Physical Exam  Assessment and Plan :  No diagnosis found.  Benny LennertSarah Weber PA-C  [facility] 12/22/2015 10:18 AM

## 2015-12-22 NOTE — Progress Notes (Signed)
Subjective:    Patient ID: [name] Young, female    DOB: Mar 02, 1951, 65 y.o.   MRN: [id number]  12/22/2015  Follow-up; Rash; and OTHER   HPI This 65 y.o. female presents for evaluation of persistent cough. Evaluated on 12/15/2015; prescribed a Zpack at that visit. Found a bottle that Dr. Dareen Piano prescribed for patient last October that worked effectively.  Onset three weeks ago.   (Biaxin)  History of asthma as a child; gets recurrent bronchitis every fall and spring.  Does not take allergy medications maintenance; uses PRN only.  Was taking Mucinex but has stopped recently; has Lawyer which is working to some benefit.  Still coughing up yellow mucous; feels like needs another abx. Denies fever/chills/sweats.  Denies headaches.  +rhinorrhea but has improved; +nasal congestion.  +coughing with sputum production yellow. +wheezing.  No SOB; no n/v/d.    Rash/ringworm: history of eczema in past; friend concerned about ring worm.  Has been applying Ketoconazole cream once daily with improvement.  This R arm has cleared up 95%.  Skin still looks funny; rash eats out under the skin.  Gurney Maxin prescribed ketoconazole at last visit.  R arm is better; L arm is slowly getting better.  Also has rash on R facial region; ketoconazole cream burns and causes skin to throb.  Itches like crazy.  Applied medication.  Has applied more than once per day. Face aches when applies cream to face.  L arm rash is persistent but has improved; R arm rash much improved.     Review of Systems  Constitutional: Negative for fever, chills, diaphoresis and fatigue.  HENT: Positive for congestion, postnasal drip, rhinorrhea and voice change. Negative for ear pain, sinus pressure, sore throat and trouble swallowing.   Respiratory: Positive for cough. Negative for shortness of breath and wheezing.   Gastrointestinal: Negative for nausea, vomiting and constipation.  Skin: Positive for rash.  Neurological: Negative for  dizziness and headaches.    Past Medical History  Diagnosis Date  . Hyperlipidemia   . Allergy   . Anemia   . Asthma   . Bronchitis    Past Surgical History  Procedure Laterality Date  . Cesarean section    . Tubal ligation     Allergies  Allergen Reactions  . Codeine Itching  . Mobic [Meloxicam]     Dizzy/ lightheaded    Social History   Social History  . Marital Status: Married    Spouse Name: N/A  . Number of Children: N/A  . Years of Education: N/A   Occupational History  . Not on file.   Social History Main Topics  . Smoking status: Never Smoker   . Smokeless tobacco: Never Used  . Alcohol Use: No  . Drug Use: No  . Sexual Activity: No   Other Topics Concern  . Not on file   Social History Narrative   Family History  Problem Relation Age of Onset  . Diabetes Paternal Grandmother   . Diabetes Paternal Grandfather        Objective:    BP 118/76 mmHg  Pulse 70  Temp(Src) 98.4 F (36.9 C) (Oral)  Resp 16  Ht  (1.549 m)  Wt 171 lb (77.565 kg)  BMI 32.33 kg/m2  SpO2 96% Physical Exam  Constitutional: She is oriented to person, place, and time. She appears well-developed and well-nourished. No distress.  HENT:  Head: Normocephalic and atraumatic.    Right Ear: Tympanic membrane, external ear and ear canal  normal.  Left Ear: Tympanic membrane, external ear and ear canal normal.  Nose: Mucosal edema and rhinorrhea present. Right sinus exhibits no maxillary sinus tenderness and no frontal sinus tenderness. Left sinus exhibits no maxillary sinus tenderness and no frontal sinus tenderness.  Mouth/Throat: Uvula is midline, oropharynx is clear and moist and mucous membranes are normal.  Eyes: Conjunctivae are normal. Pupils are equal, round, and reactive to light.  Neck: Normal range of motion. Neck supple.  Cardiovascular: Normal rate, regular rhythm and normal heart sounds.  Exam reveals no gallop and no friction rub.   No murmur  heard. Pulmonary/Chest: Effort normal and breath sounds normal. She has no wheezes. She has no rales.  Neurological: She is alert and oriented to person, place, and time.  Skin: Skin is warm and dry. Rash noted. She is not diaphoretic.     Annular rash with central clearing L forearm.  Similar yet milder rash R mandibular region.  R arm with faint residual annular rash.  Psychiatric: She has a normal mood and affect. Her behavior is normal.  Nursing note and vitals reviewed.  Results for orders placed or performed in visit on 12/22/15  POCT CBC  Result Value Ref Range   WBC 5.1 4.6 - 10.2 K/uL   Lymph, poc 2.1 0.6 - 3.4   POC LYMPH PERCENT 40.6 10 - 50 %L   MID (cbc) 0.3 0 - 0.9   POC MID % 6.8 0 - 12 %M   POC Granulocyte 2.7 2 - 6.9   Granulocyte percent 52.6 37 - 80 %G   RBC 4.65 4.04 - 5.48 M/uL   Hemoglobin 14.1 12.2 - 16.2 g/dL   HCT, POC 09.840.5 11.937.7 - 47.9 %   MCV 87.0 80 - 97 fL   MCH, POC 30.3 27 - 31.2 pg   MCHC 34.8 31.8 - 35.4 g/dL   RDW, POC 14.712.2 %   Platelet Count, POC 207 142 - 424 K/uL   MPV 7.1 0 - 99.8 fL   Dg Chest 2 View  12/22/2015  CLINICAL DATA:  Cough and congestion. EXAM: CHEST  2 VIEW COMPARISON:  October 05, 2013. FINDINGS: The heart size and mediastinal contours are within normal limits. Both lungs are clear. No pneumothorax or pleural effusion is noted. The visualized skeletal structures are unremarkable. IMPRESSION: No active cardiopulmonary disease. Electronically Signed   By: Lupita RaiderJames  Green Jr, M.D.   On: 12/22/2015 11:22   ALBUTEROL NEBULIZER    Assessment & Plan:   1. Lower respiratory infection   2. Bronchospasm   3. Tinea corporis   4. Seasonal allergic rhinitis due to pollen     1. Lower respiratory infection: persistent; rx for Biaxin provided; pt declined Prednisone.  Continue tessalon perles and mucinex. 2.  Bronchospasm: New. S/p Albuterol neb in office; pt declined Prednisone taper.  If persists, provide rx for Albuterol HFA.   Contributing significantly to cough. Financial constraints at this time. 3. Allergic rhinitis: uncontrolled; recommend restarting Zyrtec; sent in rx for  Flonase. 4.  Tinea corporis: Improving; continue Ketoconazole 2% daily; consider OTC antifungal for face.   Orders Placed This Encounter  Procedures  . DG Chest 2 View    Standing Status: Future     Number of Occurrences: 1     Standing Expiration Date: 12/21/2016    Order Specific Question:  Reason for Exam (SYMPTOM  OR DIAGNOSIS REQUIRED)    Answer:  cough, wheezing, sputum production    Order Specific Question:  Preferred  imaging location?    Answer:  External  . POCT CBC   Meds ordered this encounter  Medications  . albuterol (PROVENTIL) (2.5 MG/3ML) 0.083% nebulizer solution 2.5 mg    Sig:   . DISCONTD: clarithromycin (BIAXIN) 500 MG tablet    Sig: Take 1 tablet (500 mg total) by mouth 2 (two) times daily.    Dispense:  20 tablet    Refill:  0  . DISCONTD: fluticasone (FLONASE) 50 MCG/ACT nasal spray    Sig: Place 2 sprays into both nostrils daily.    Dispense:  16 g    Refill:  11  . clarithromycin (BIAXIN) 500 MG tablet    Sig: Take 1 tablet (500 mg total) by mouth 2 (two) times daily.    Dispense:  20 tablet    Refill:  0  . fluticasone (FLONASE) 50 MCG/ACT nasal spray    Sig: Place 2 sprays into both nostrils daily.    Dispense:  16 g    Refill:  11    No Follow-up on file.    Dirk Vanaman Paulita Fujita, M.D. [facility] [address] [place], [place]  [address] [phone] phone [phone] fax

## 2015-12-22 NOTE — Patient Instructions (Addendum)
IF you received an x-ray today, you will receive an invoice from [facility]. Please contact [facility] at [phone] with questions or concerns regarding your invoice.   IF you received labwork today, you will receive an invoice from [employer]. Please contact [facility] at [phone] with questions or concerns regarding your invoice.   Our billing staff will not be able to assist you with questions regarding bills from these companies.  You will be contacted with the lab results as soon as they are available. The fastest way to get your results is to activate your My Chart account. Instructions are located on the last page of this paperwork. If you have not heard from [place] regarding the results in 2 weeks, please contact this office.   1.  Take Zyrtec  one tablet daily from March through June; then restart medication on September through December. 2.  Take Flonase 2 sprays into each nostril once daily during allergy season.   Allergic Rhinitis Allergic rhinitis is when the mucous membranes in the nose respond to allergens. Allergens are particles in the air that cause your body to have an allergic reaction. This causes you to release allergic antibodies. Through a chain of events, these eventually cause you to release histamine into the blood stream. Although meant to protect the body, it is this release of histamine that causes your discomfort, such as frequent sneezing, congestion, and an itchy, runny nose.  CAUSES Seasonal allergic rhinitis (hay fever) is caused by pollen allergens that may come from grasses, trees, and weeds. Year-round allergic rhinitis (perennial allergic rhinitis) is caused by allergens such as house dust mites, pet dander, and mold spores. SYMPTOMS  Nasal stuffiness (congestion).  Itchy, runny nose with sneezing and tearing of the eyes. DIAGNOSIS Your health care provider can help you determine the allergen or  allergens that trigger your symptoms. If you and your health care provider are unable to determine the allergen, skin or blood testing may be used. Your health care provider will diagnose your condition after taking your health history and performing a physical exam. Your health care provider may assess you for other related conditions, such as asthma, pink eye, or an ear infection. TREATMENT Allergic rhinitis does not have a cure, but it can be controlled by:  Medicines that block allergy symptoms. These may include allergy shots, nasal sprays, and oral antihistamines.  Avoiding the allergen. Hay fever may often be treated with antihistamines in pill or nasal spray forms. Antihistamines block the effects of histamine. There are over-the-counter medicines that may help with nasal congestion and swelling around the eyes. Check with your health care provider before taking or giving this medicine. If avoiding the allergen or the medicine prescribed do not work, there are many new medicines your health care provider can prescribe. Stronger medicine may be used if initial measures are ineffective. Desensitizing injections can be used if medicine and avoidance does not work. Desensitization is when a patient is given ongoing shots until the body becomes less sensitive to the allergen. Make sure you follow up with your health care provider if problems continue. HOME CARE INSTRUCTIONS It is not possible to completely avoid allergens, but you can reduce your symptoms by taking steps to limit your exposure to them. It helps to know exactly what you are allergic to so that you can avoid your specific triggers. SEEK MEDICAL CARE IF:  You have a fever.  You develop a cough that does not stop easily (persistent).  You have shortness of breath.  You start wheezing.  Symptoms interfere with normal daily activities.   This information is not intended to replace advice given to you by your health care provider.  Make sure you discuss any questions you have with your health care provider.   Document Released: 06/25/2001 Document Revised: 10/21/2014 Document Reviewed: 06/07/2013 Elsevier Interactive Patient Education [employer]2016 Elsevier Inc.

## 2015-12-29 ENCOUNTER — Other Ambulatory Visit: Payer: Self-pay | Admitting: Family Medicine

## 2015-12-29 ENCOUNTER — Telehealth: Payer: Self-pay

## 2015-12-29 DIAGNOSIS — B354 Tinea corporis: Secondary | ICD-10-CM

## 2015-12-29 DIAGNOSIS — R21 Rash and other nonspecific skin eruption: Secondary | ICD-10-CM

## 2015-12-29 MED ORDER — KETOCONAZOLE 2 % EX CREA: 1.0000 "application " | TOPICAL_CREAM | Freq: Two times a day (BID) | CUTANEOUS | Status: AC

## 2015-12-29 NOTE — Telephone Encounter (Signed)
Pt would like a refill on her ketoconazole (NIZORAL) 2 % cream [[id number]][[id number]]. Her arm is bothering her-swelling. [place] in [place] at the moment and would [facility] to [facility] on [address] in [place]. The store # is [phone]. Please advise at [phone]

## 2016-04-12 ENCOUNTER — Telehealth: Payer: Self-pay

## 2016-04-12 DIAGNOSIS — R21 Rash and other nonspecific skin eruption: Secondary | ICD-10-CM

## 2016-04-12 NOTE — Telephone Encounter (Signed)
Pt was seen for a rash and it has returned.  She would like a referral to [facility].  [phone]

## 2016-04-12 NOTE — Telephone Encounter (Signed)
Routed to Dr. Smith  

## 2016-04-17 NOTE — Telephone Encounter (Signed)
Referral to dermatology placed.  Please advise patient.

## 2016-04-18 NOTE — Telephone Encounter (Signed)
Pt advised.

## 2016-05-10 ENCOUNTER — Telehealth: Payer: Self-pay

## 2016-05-10 NOTE — Telephone Encounter (Signed)
Pt would like all her prescriptions to now go to [facility] on [address]

## 2016-11-08 ENCOUNTER — Encounter: Payer: Self-pay | Admitting: Family Medicine

## 2016-11-08 ENCOUNTER — Ambulatory Visit (INDEPENDENT_AMBULATORY_CARE_PROVIDER_SITE_OTHER): Payer: 59 | Admitting: Family Medicine

## 2016-11-08 VITALS — BP 128/74 | HR 60 | Temp 98.3°F | Resp 16 | Ht 61.0 in | Wt 177.0 lb

## 2016-11-08 DIAGNOSIS — M25531 Pain in right wrist: Secondary | ICD-10-CM

## 2016-11-08 DIAGNOSIS — M542 Cervicalgia: Secondary | ICD-10-CM

## 2016-11-08 DIAGNOSIS — H9203 Otalgia, bilateral: Secondary | ICD-10-CM | POA: Diagnosis not present

## 2016-11-08 DIAGNOSIS — M79645 Pain in left finger(s): Secondary | ICD-10-CM

## 2016-11-08 DIAGNOSIS — M25532 Pain in left wrist: Secondary | ICD-10-CM | POA: Diagnosis not present

## 2016-11-08 DIAGNOSIS — M25552 Pain in left hip: Secondary | ICD-10-CM | POA: Diagnosis not present

## 2016-11-08 DIAGNOSIS — M2550 Pain in unspecified joint: Secondary | ICD-10-CM

## 2016-11-08 DIAGNOSIS — M79644 Pain in right finger(s): Secondary | ICD-10-CM | POA: Diagnosis not present

## 2016-11-08 DIAGNOSIS — M25512 Pain in left shoulder: Secondary | ICD-10-CM | POA: Diagnosis not present

## 2016-11-08 DIAGNOSIS — M25551 Pain in right hip: Secondary | ICD-10-CM

## 2016-11-08 DIAGNOSIS — M25511 Pain in right shoulder: Secondary | ICD-10-CM

## 2016-11-08 DIAGNOSIS — R51 Headache: Secondary | ICD-10-CM

## 2016-11-08 DIAGNOSIS — R519 Headache, unspecified: Secondary | ICD-10-CM

## 2016-11-08 NOTE — Patient Instructions (Addendum)
Ok to continue celebrex, and add tylenol if needed. Heat or ice to affected areas if needed.  Let me look at some labwork for types of arthritis, and follow up to discuss further testing or possible rheumatology referral in next few weeks. Return to the clinic or go to the nearest emergency room if any of your symptoms worsen or new symptoms occur.  I do not see any concerns on your neck or ear exam today. I will refer you to your nose and throat as requested to look into other evaluation or imaging if needed. If you have fevers, persistent discolored nasal discharge, or any worsening of your symptoms, return for recheck sooner.  I do not know of a specific cause why food would taste more salty, but will check your electrolytes today.  Return to the clinic or go to the nearest emergency room if any of your symptoms worsen or new symptoms occur.    IF you received an x-ray today, you will receive an invoice from [facility]. Please contact [facility] at [phone] with questions or concerns regarding your invoice.   IF you received labwork today, you will receive an invoice from [facility]. Please contact [facility] at [phone] with questions or concerns regarding your invoice.   Our billing staff will not be able to assist you with questions regarding bills from these companies.  You will be contacted with the lab results as soon as they are available. The fastest way to get your results is to activate your My Chart account. Instructions are located on the last page of this paperwork. If you have not heard from us regarding the results in 2 weeks, please contact this office.

## 2016-11-08 NOTE — Progress Notes (Signed)
Subjective:  By signing my name below, I, [name] Young, attest that this documentation has been prepared under the direction and in the presence of Meredith Staggers, MD.  Electronically Signed: Andrew Au, ED Scribe. 11/08/2016. 9:46 AM.   Patient ID: [name] Young, female    DOB: Oct 14, 1951, 66 y.o.   MRN: [id number]  HPI Chief Complaint  Patient presents with  . Referral    ENT  . Joint Pain    Bilateral Hands   HPI Comments: [name] Young is a 66 y.o. female who presents to the Primary Care at [facility] complaining of arthralgias. She locates pain to bilateral shoulder, for the past  3 months, hips for about 1 month, wrist, and fingers. She reports worsening shoulder pain in the morning after sleep on either shoulder. She also notes experiencing shoulder She denies injury. She had a negative rheumatoid factor in 2014. She recently restarted taking Celebrex, refilled mediation 3-4 days ago. She had stopped taking medication due to cost.   Pt reports ear pain the goes down into neck for the past 2 weeks. She also no ear sensitivity when getting water in ears and sinus pressure and pain behind right eye. She denies fever, chills and vision changes.  Patient Active Problem List   Diagnosis Date Noted  . Seasonal allergies 10/30/2012   Past Medical History:  Diagnosis Date  . Allergy   . Anemia   . Asthma   . Bronchitis   . Hyperlipidemia    Past Surgical History:  Procedure Laterality Date  . CESAREAN SECTION    . TUBAL LIGATION     Allergies  Allergen Reactions  . Codeine Itching  . Mobic [Meloxicam]     Dizzy/ lightheaded   Prior to Admission medications   Medication Sig Start Date End Date Taking? Authorizing Provider  celecoxib (CELEBREX) 100 MG capsule TAKE ONE CAPSULE BY MOUTH TWICE DAILY WITH FOOD 12/15/15  Yes Wallis Bamberg, PA-C  metroNIDAZOLE (METROGEL) 0.75 % gel Apply 1 application topically 2 (two) times daily.   Yes Historical Provider, MD  HYDROcodone-homatropine  (HYCODAN) 5-1.5 MG/5ML syrup Take 5 mLs by mouth at bedtime as needed. Patient not taking: Reported on 11/08/2016 12/15/15   Wallis Bamberg, PA-C   Social History   Social History  . Marital status: Married    Spouse name: N/A  . Number of children: N/A  . Years of education: N/A   Occupational History  . Not on file.   Social History Main Topics  . Smoking status: Never Smoker  . Smokeless tobacco: Never Used  . Alcohol use No  . Drug use: No  . Sexual activity: No   Other Topics Concern  . Not on file   Social History Narrative  . No narrative on file    Review of Systems  Constitutional: Negative for chills and fever.  HENT: Positive for ear pain, sinus pain and sinus pressure.   Eyes: Negative for visual disturbance.  Musculoskeletal: Positive for arthralgias.     Objective:   Physical Exam  Constitutional: She is oriented to person, place, and time. She appears well-developed and well-nourished. No distress.  HENT:  Head: Normocephalic and atraumatic.  TMJ non tender no clicks.  Eyes: Conjunctivae and EOM are normal.  Neck: Neck supple.  Cardiovascular: Normal rate.   Pulses:      Radial pulses are 2+ on the right side, and 2+ on the left side.  Pulmonary/Chest: Effort normal.  Musculoskeletal: Normal range of motion.  Equal ROM  of bilateral shoulder. Negative neer. negative Hawkins. Negative empty can. Negative tinel. Negative Phalen at wrist. Slight prominence of DIP of 2nd and 5th of left hand and 2nd, 3rd and 5th of right hands.    Lymphadenopathy:    She has no cervical adenopathy.  Neurological: She is alert and oriented to person, place, and time.  Cap refill < 2 sec   Skin: Skin is warm and dry.  Psychiatric: She has a normal mood and affect. Her behavior is normal.  Nursing note and vitals reviewed.  Vitals:   11/08/16 0936  BP: 128/74  Pulse: 60  Resp: 16  Temp: 98.3 F (36.8 C)  TempSrc: Oral  SpO2: 98%  Weight: 177 lb (80.3 kg)  Height: 5'  1" (1.549 m)   Assessment & Plan:   Janya Eveland is a 66 y.o. female Polyarthralgia - Plan: Basic metabolic panel, Rheumatoid factor, Sedimentation Rate, Uric Acid, CBC with Differential/Platelet, Care order/instruction: Bilateral shoulder pain, unspecified chronicity Bilateral hip pain Pain in both wrists Pain in finger of both hands  - Polyarthralgia. Check rheumatoid factor, sedimentation rate, uric acid, CBC and follow-up to discuss further workup or possible rheumatology eval based on multiple joints involved. Differential includes inflammatory arthritis such as rheumatoid arthritis, so labwork above. Okay to continue Celebrex for now.  Head and face pain - Plan: Ambulatory referral to ENT, CBC with Differential/Platelet Neck pain - Plan: Ambulatory referral to ENT Ear pain, bilateral - Plan: Ambulatory referral to ENT, CBC with Differential/Platelet  -No concerning findings on exam, TMJ nontender, no apparent tonsillar involvement or lymphadenopathy, but will refer to ENT at her request to determine if imaging or other evaluation needed for her symptoms.  Complains of food tasting more salty  - I will check BMP, but return to discuss the symptoms further if persistent.  Also discuss with ENT.  Meds ordered this encounter  Medications  . metroNIDAZOLE (METROGEL) 0.75 % gel    Sig: Apply 1 application topically 2 (two) times daily.   Patient Instructions   Ok to continue celebrex, and add tylenol if needed. Heat or ice to affected areas if needed.  Let me look at some labwork for types of arthritis, and follow up to discuss further testing or possible rheumatology referral in next few weeks. Return to the clinic or go to the nearest emergency room if any of your symptoms worsen or new symptoms occur.  I do not see any concerns on your neck or ear exam today. I will refer you to your nose and throat as requested to look into other evaluation or imaging if needed. If you have fevers,  persistent discolored nasal discharge, or any worsening of your symptoms, return for recheck sooner.  I do not know of a specific cause why food would taste more salty, but will check your electrolytes today.  Return to the clinic or go to the nearest emergency room if any of your symptoms worsen or new symptoms occur.    IF you received an x-ray today, you will receive an invoice from [facility]. Please contact [facility] at [phone] with questions or concerns regarding your invoice.   IF you received labwork today, you will receive an invoice from [facility]. Please contact [facility] at [phone] with questions or concerns regarding your invoice.   Our billing staff will not be able to assist you with questions regarding bills from these companies.  You will be contacted with the lab results as soon as they are available.  The fastest way to get your results is to activate your My Chart account. Instructions are located on the last page of this paperwork. If you have not heard from us regarding the results in 2 weeks, please contact this office.      I personally performed the services described in this documentation, which was scribed in my presence. The recorded information has been reviewed and considered, and addended by me as needed.   Signed,   Meredith StaggersJeffrey Amandalynn Pitz, MD Primary Care at [facility].  11/10/16 3:47 PM

## 2016-11-09 LAB — CBC WITH DIFFERENTIAL/PLATELET
BASOS ABS: 0 10*3/uL (ref 0.0–0.2)
Basos: 0 %
EOS (ABSOLUTE): 0.2 10*3/uL (ref 0.0–0.4)
Eos: 4 %
HEMOGLOBIN: 14.8 g/dL (ref 11.1–15.9)
Hematocrit: 42.6 % (ref 34.0–46.6)
Immature Grans (Abs): 0 10*3/uL (ref 0.0–0.1)
Immature Granulocytes: 0 %
Lymphocytes Absolute: 2 10*3/uL (ref 0.7–3.1)
Lymphs: 36 %
MCH: 30.5 pg (ref 26.6–33.0)
MCHC: 34.7 g/dL (ref 31.5–35.7)
MCV: 88 fL (ref 79–97)
MONOCYTES: 5 %
MONOS ABS: 0.3 10*3/uL (ref 0.1–0.9)
NEUTROS ABS: 3.1 10*3/uL (ref 1.4–7.0)
Neutrophils: 55 %
Platelets: 245 10*3/uL (ref 150–379)
RBC: 4.86 x10E6/uL (ref 3.77–5.28)
RDW: 13.4 % (ref 12.3–15.4)
WBC: 5.7 10*3/uL (ref 3.4–10.8)

## 2016-11-09 LAB — RHEUMATOID FACTOR: Rhuematoid fact SerPl-aCnc: <10 IU/mL (ref 0.0–13.9)

## 2016-11-09 LAB — SEDIMENTATION RATE: SED RATE: 2 mm/h (ref 0–40)

## 2016-11-09 LAB — BASIC METABOLIC PANEL
BUN / CREAT RATIO: 24 (ref 12–28)
BUN: 18 mg/dL (ref 8–27)
CALCIUM: 10 mg/dL (ref 8.7–10.3)
CO2: 22 mmol/L (ref 18–29)
Chloride: 103 mmol/L (ref 96–106)
Creatinine, Ser: 0.75 mg/dL (ref 0.57–1.00)
GFR, EST AFRICAN AMERICAN: 98 mL/min/{1.73_m2} (ref >59)
GFR, EST NON AFRICAN AMERICAN: 85 mL/min/{1.73_m2} (ref >59)
Glucose: 89 mg/dL (ref 65–99)
POTASSIUM: 4.8 mmol/L (ref 3.5–5.2)
Sodium: 143 mmol/L (ref 134–144)

## 2016-11-09 LAB — URIC ACID: URIC ACID: 4.7 mg/dL (ref 2.5–7.1)

## 2016-11-18 ENCOUNTER — Encounter: Payer: Self-pay | Admitting: *Deleted

## 2016-12-06 DIAGNOSIS — G8929 Other chronic pain: Secondary | ICD-10-CM | POA: Insufficient documentation

## 2016-12-06 HISTORY — DX: Other chronic pain: G89.29

## 2016-12-31 ENCOUNTER — Other Ambulatory Visit: Payer: Self-pay | Admitting: Urgent Care

## 2016-12-31 DIAGNOSIS — M2550 Pain in unspecified joint: Secondary | ICD-10-CM

## 2017-06-08 ENCOUNTER — Other Ambulatory Visit: Payer: Self-pay | Admitting: Physician Assistant

## 2017-06-08 DIAGNOSIS — M2550 Pain in unspecified joint: Secondary | ICD-10-CM

## 2017-07-05 ENCOUNTER — Other Ambulatory Visit: Payer: Self-pay | Admitting: Family Medicine

## 2017-07-05 DIAGNOSIS — M2550 Pain in unspecified joint: Secondary | ICD-10-CM

## 2017-07-11 NOTE — Telephone Encounter (Signed)
CALLED PT TO SCHEDULE AN OV FOR MED REFILL VOICEMAIL WAS FULL SENT OUT AN UNABLE TO REACH LETTER

## 2017-09-16 ENCOUNTER — Encounter: Payer: Self-pay | Admitting: Family Medicine

## 2017-09-16 ENCOUNTER — Other Ambulatory Visit: Payer: Self-pay | Admitting: Family Medicine

## 2017-09-16 ENCOUNTER — Ambulatory Visit: Payer: 59 | Admitting: Family Medicine

## 2017-09-16 VITALS — BP 122/72 | HR 67 | Temp 98.0°F | Resp 17 | Ht 61.0 in | Wt 174.0 lb

## 2017-09-16 DIAGNOSIS — M2550 Pain in unspecified joint: Secondary | ICD-10-CM

## 2017-09-16 DIAGNOSIS — R609 Edema, unspecified: Secondary | ICD-10-CM

## 2017-09-16 DIAGNOSIS — M25541 Pain in joints of right hand: Secondary | ICD-10-CM

## 2017-09-16 DIAGNOSIS — M25542 Pain in joints of left hand: Secondary | ICD-10-CM

## 2017-09-16 DIAGNOSIS — M79646 Pain in unspecified finger(s): Secondary | ICD-10-CM | POA: Diagnosis not present

## 2017-09-16 DIAGNOSIS — M25561 Pain in right knee: Secondary | ICD-10-CM

## 2017-09-16 DIAGNOSIS — M25512 Pain in left shoulder: Secondary | ICD-10-CM

## 2017-09-16 MED ORDER — CELECOXIB 100 MG PO CAPS: 100.0000 mg | ORAL_CAPSULE | Freq: Two times a day (BID) | ORAL | 1 refills | Status: DC | PRN

## 2017-09-16 NOTE — Patient Instructions (Addendum)
Try Tylenol for milder pain, glucosamine/chondroitin over the counter. But I would like you to meet with specialist to determine if other testing needed. Celebrex if needed for more severe pain for now.  See information below on swelling/peripheral edema. Try to avoid sodium in the diet, especially restaurant or frozen foods tend to have higher levels. Follow-up to discuss that further.  Please follow-up for a physical so we can review other health maintenance items as your previous primary care provider is now retired.   Return to the clinic or go to the nearest emergency room if any of your symptoms worsen or new symptoms occur.    Peripheral Edema Peripheral edema is swelling that is caused by a buildup of fluid. Peripheral edema most often affects the lower legs, ankles, and feet. It can also develop in the arms, hands, and face. The area of the body that has peripheral edema will look swollen. It may also feel heavy or warm. Your clothes may start to feel tight. Pressing on the area may make a temporary dent in your skin. You may not be able to move your arm or leg as much as usual. There are many causes of peripheral edema. It can be a complication of other diseases, such as congestive heart failure, kidney disease, or a problem with your blood circulation. It also can be a side effect of certain medicines. It often happens to women during pregnancy. Sometimes, the cause is not known. Treating the underlying condition is often the only treatment for peripheral edema. Follow these instructions at home: Pay attention to any changes in your symptoms. Take these actions to help with your discomfort:  Raise (elevate) your legs while you are sitting or lying down.  Move around often to prevent stiffness and to lessen swelling. Do not sit or stand for long periods of time.  Wear support stockings as told by your health care provider.  Follow instructions from your health care provider about  limiting salt (sodium) in your diet. Sometimes eating less salt can reduce swelling.  Take over-the-counter and prescription medicines only as told by your health care provider. Your health care provider may prescribe medicine to help your body get rid of excess water (diuretic).  Keep all follow-up visits as told by your health care provider. This is important.  Contact a health care provider if:  You have a fever.  Your edema starts suddenly or is getting worse, especially if you are pregnant or have a medical condition.  You have swelling in only one leg.  You have increased swelling and pain in your legs. Get help right away if:  You develop shortness of breath, especially when you are lying down.  You have pain in your chest or abdomen.  You feel weak.  You faint. This information is not intended to replace advice given to you by your health care provider. Make sure you discuss any questions you have with your health care provider. Document Released: 11/07/2004 Document Revised: 03/04/2016 Document Reviewed: 04/12/2015 Elsevier Interactive Patient Education  2018 [employer]    IF you received an x-ray today, you will receive an invoice from [facility]. Please contact [facility] at [phone] with questions or concerns regarding your invoice.   IF you received labwork today, you will receive an invoice from [facility]. Please contact [facility] at [phone] with questions or concerns regarding your invoice.   Our billing staff will not be able to assist you with questions regarding bills from these companies.  You will be contacted with the lab results as soon as they are available. The fastest way to get your results is to activate your My Chart account. Instructions are located on the last page of this paperwork. If you have not heard from [place] regarding the results in 2 weeks, please contact this office.

## 2017-09-16 NOTE — Progress Notes (Signed)
Subjective:  By signing my name below, I, [name] Young, attest that this documentation has been prepared under the direction and in the presence of Shade FloodJeffrey R Deitrick Ferreri, MD Electronically Signed: Charline BillsEssence Young, ED Scribe 09/16/2017 at 10:53 AM.   Patient ID: [name] Archerarol Kines, female    DOB: 1951-03-16, 66 y.o.   MRN: [id number]  Chief Complaint  Patient presents with  . Follow-up    celebrex,   HPI [name] ArcherCarol Young is a 66 y.o. female who [facility] at [facility] for f/u of arthralgias. Seen in January for bilateral shoulder pain, hip pain, wrist and finger pain. Shoulder pain worse in the morning. Negative RF in 2014. Continued on Celebrex at that time. Recommend Tylenol if needed. Normal CBC, BMP, RF, sed rate, uric acid. Last prescription for Celebrex for #60 on 8/27.   Pt reports pain in the same areas as in January, but specifically at the base of both thumbs, R knee and L shoulder at this time. No known fall or injury. She also reports intermittent joint swelling in her thumbs for several years as well as leg swelling. She does report previous fracture of the R thumb in highschool and states she plays the piano. Pt has noticed that pain is exacerbated with consuming more sodium in her diet. Reports that arthralgias are improved with taking calcium, which she is not currently taking. Pt has been taking Celebrex 100 mg bid for several years, which she states provides significant relief. She ran out of medication ~6 weeks ago. Pt has not tried Tylenol. She has not been seen by ortho or rheumatology. Pt does not want to try cortisone injections and is against surgeries if recommended.  Patient Active Problem List   Diagnosis Date Noted  . Seasonal allergies 10/30/2012   Past Medical History:  Diagnosis Date  . Allergy   . Anemia   . Asthma   . Bronchitis   . Hyperlipidemia    Past Surgical History:  Procedure Laterality Date  . CESAREAN SECTION    . TUBAL LIGATION     Allergies    Allergen Reactions  . Codeine Itching  . Mobic [Meloxicam]     Dizzy/ lightheaded   Prior to Admission medications   Medication Sig Start Date End Date Taking? Authorizing Provider  celecoxib (CELEBREX) 100 MG capsule Take 1 capsule (100 mg total) by mouth 2 (two) times daily. Office visit needed 06/09/17   Shade FloodGreene, Aariyah Sampey R, MD  HYDROcodone-homatropine [facility](HYCODAN) 5-1.5 MG/5ML syrup Take 5 mLs by mouth at bedtime as needed. Patient not taking: Reported on 11/08/2016 12/15/15   Wallis BambergMani, Mario, PA-C  metroNIDAZOLE (METROGEL) 0.75 % gel Apply 1 application topically 2 (two) times daily.    [provider]   Social History   Socioeconomic History  . Marital status: Married    Spouse name: Not on file  . Number of children: Not on file  . Years of education: Not on file  . Highest education level: Not on file  Social Needs  . Financial resource strain: Not on file  . Food insecurity - worry: Not on file  . Food insecurity - inability: Not on file  . Transportation needs - medical: Not on file  . Transportation needs - non-medical: Not on file  Occupational History  . Not on file  Tobacco Use  . Smoking status: Never Smoker  . Smokeless tobacco: Never Used  Substance and Sexual Activity  . Alcohol use: No    Alcohol/week: 0.0 oz  .  Drug use: No  . Sexual activity: No  Other Topics Concern  . Not on file  Social History Narrative  . Not on file   Review of Systems  Cardiovascular: Positive for leg swelling.  Musculoskeletal: Positive for arthralgias and joint swelling.      Objective:   Physical Exam  Constitutional: She is oriented to person, place, and time. She appears well-developed and well-nourished. No distress.  HENT:  Head: Normocephalic and atraumatic.  Eyes: Conjunctivae and EOM are normal.  Neck: Neck supple. No tracheal deviation present.  Cardiovascular: Normal rate and regular rhythm. Exam reveals no gallop and no friction rub.  No murmur  heard. Pulmonary/Chest: Effort normal and breath sounds normal. No respiratory distress.  Musculoskeletal: Normal range of motion.  Prominence of MCP of L 1st phalanx, similar appearing R 1st phalanx with prominence at base of thumb. Pain at R base of thumb with thumb flexion. Possible slight decreased extension at the MCP. Trace non-pitting edema to LE.  L shoulder: full ROM. No focal bony tenderness. Full RTC strength. R knee: full ROM. No bony tenderness. No effusion.  Neurological: She is alert and oriented to person, place, and time.  Skin: Skin is warm and dry.  Psychiatric: She has a normal mood and affect. Her behavior is normal.  Nursing note and vitals reviewed.  Vitals:   09/16/17 1007  BP: 122/72  Pulse: 67  Resp: 17  Temp: 98 F (36.7 C)  TempSrc: Oral  SpO2: 98%  Weight: 174 lb (78.9 kg)  Height: 5\' 1"  (1.549 m)      Assessment & Plan:    [name] Young is a 66 y.o. female Polyarthralgia - Plan: Ambulatory referral to Rheumatology Thumb pain, unspecified laterality - Plan: Ambulatory referral to Rheumatology Joint pain - Plan: celecoxib (CELEBREX) 100 MG capsule, Ambulatory referral to Rheumatology Right knee pain, unspecified chronicity - Plan: Ambulatory referral to Rheumatology Left shoulder pain, unspecified chronicity - Plan: Ambulatory referral to Rheumatology  - Polyarthralgia, possible osteoarthritis, but with migratory nature, inflammatory arthritis possible. Previous uric acid, sedimentation rate, rheumatoid factor were reassuring in February.   -Discussed hand/ortho eval for consideration of injection of CMC joint pain. However she would not want injection.  -She would like to continue Celebrex.  Discussed concerns with long-term use of this medication, potential side effects/risk of NSAIDs including Celebrex were discussed and handout given from up-to-date. Recommended trial of Tylenol for milder pain, 6-8 week trial of glucosamine/contrite and may help if  this is osteoarthritic pain, refilled Celebrex temporarily, but will refer to rheumatology to decide if other testing needed. Also could consider physical therapy if persistent pain. RTC precautions   Peripheral edema - Plan: Basic metabolic panel  - Do not appreciate significant edema in office. May be diet related. Recommended against high sodium foods and handout given regarding peripheral edema. Check BMP, RTC precautions if worsening   Meds ordered this encounter  Medications  . celecoxib (CELEBREX) 100 MG capsule    Sig: Take 1 capsule (100 mg total) by mouth 2 (two) times daily as needed. Office visit needed    Dispense:  60 capsule    Refill:  1   Patient Instructions   Try Tylenol for milder pain, glucosamine/chondroitin over the counter. But I would like you to meet with specialist to determine if other testing needed. Celebrex if needed for more severe pain for now.  See information below on swelling/peripheral edema. Try to avoid sodium in the diet, especially restaurant or frozen  foods tend to have higher levels. Follow-up to discuss that further.  Please follow-up for a physical so we can review other health maintenance items as your previous primary care provider is now retired.   Return to the clinic or go to the nearest emergency room if any of your symptoms worsen or new symptoms occur.    Peripheral Edema Peripheral edema is swelling that is caused by a buildup of fluid. Peripheral edema most often affects the lower legs, ankles, and feet. It can also develop in the arms, hands, and face. The area of the body that has peripheral edema will look swollen. It may also feel heavy or warm. Your clothes may start to feel tight. Pressing on the area may make a temporary dent in your skin. You may not be able to move your arm or leg as much as usual. There are many causes of peripheral edema. It can be a complication of other diseases, such as congestive heart failure, kidney  disease, or a problem with your blood circulation. It also can be a side effect of certain medicines. It often happens to women during pregnancy. Sometimes, the cause is not known. Treating the underlying condition is often the only treatment for peripheral edema. Follow these instructions at home: Pay attention to any changes in your symptoms. Take these actions to help with your discomfort:  Raise (elevate) your legs while you are sitting or lying down.  Move around often to prevent stiffness and to lessen swelling. Do not sit or stand for long periods of time.  Wear support stockings as told by your health care provider.  Follow instructions from your health care provider about limiting salt (sodium) in your diet. Sometimes eating less salt can reduce swelling.  Take over-the-counter and prescription medicines only as told by your health care provider. Your health care provider may prescribe medicine to help your body get rid of excess water (diuretic).  Keep all follow-up visits as told by your health care provider. This is important.  Contact a health care provider if:  You have a fever.  Your edema starts suddenly or is getting worse, especially if you are pregnant or have a medical condition.  You have swelling in only one leg.  You have increased swelling and pain in your legs. Get help right away if:  You develop shortness of breath, especially when you are lying down.  You have pain in your chest or abdomen.  You feel weak.  You faint. This information is not intended to replace advice given to you by your health care provider. Make sure you discuss any questions you have with your health care provider. Document Released: 11/07/2004 Document Revised: 03/04/2016 Document Reviewed: 04/12/2015 Elsevier Interactive Patient Education  2018 [employer].    IF you received an x-ray today, you will receive an invoice from [facility]. Please contact [facility] at [phone] with questions or concerns regarding your invoice.   IF you received labwork today, you will receive an invoice from [facility]. Please contact [facility] at [phone] with questions or concerns regarding your invoice.   Our billing staff will not be able to assist you with questions regarding bills from these companies.  You will be contacted with the lab results as soon as they are available. The fastest way to get your results is to activate your My Chart account. Instructions are located on the last page of this paperwork. If you have not heard from [place] regarding the results in  2 weeks, please contact this office.       I personally performed the services described in this documentation, which was scribed in my presence. The recorded information has been reviewed and considered for accuracy and completeness, addended by me as needed, and agree with information above.  Signed,   Meredith StaggersJeffrey Floetta Brickey, MD Primary Care at [facility].  09/19/17 11:29 AM

## 2017-09-17 LAB — BASIC METABOLIC PANEL
BUN / CREAT RATIO: 16 (ref 12–28)
BUN: 12 mg/dL (ref 8–27)
CHLORIDE: 106 mmol/L (ref 96–106)
CO2: 21 mmol/L (ref 20–29)
Calcium: 9.8 mg/dL (ref 8.7–10.3)
Creatinine, Ser: 0.76 mg/dL (ref 0.57–1.00)
GFR, EST AFRICAN AMERICAN: 96 mL/min/{1.73_m2} (ref >59)
GFR, EST NON AFRICAN AMERICAN: 83 mL/min/{1.73_m2} (ref >59)
Glucose: 86 mg/dL (ref 65–99)
POTASSIUM: 4.4 mmol/L (ref 3.5–5.2)
Sodium: 144 mmol/L (ref 134–144)

## 2017-09-18 ENCOUNTER — Other Ambulatory Visit: Payer: Self-pay | Admitting: Family Medicine

## 2017-09-18 DIAGNOSIS — M2550 Pain in unspecified joint: Secondary | ICD-10-CM

## 2017-09-19 ENCOUNTER — Encounter: Payer: Self-pay | Admitting: Family Medicine

## 2017-10-03 ENCOUNTER — Encounter: Payer: Self-pay | Admitting: Family Medicine

## 2017-10-03 ENCOUNTER — Other Ambulatory Visit: Payer: Self-pay

## 2017-10-03 ENCOUNTER — Ambulatory Visit: Payer: 59 | Admitting: Family Medicine

## 2017-10-03 VITALS — BP 112/78 | HR 84 | Temp 98.6°F | Wt 170.0 lb

## 2017-10-03 DIAGNOSIS — J069 Acute upper respiratory infection, unspecified: Secondary | ICD-10-CM

## 2017-10-03 NOTE — Patient Instructions (Signed)
     IF you received an x-ray today, you will receive an invoice from [facility]. Please contact [facility] at [phone] with questions or concerns regarding your invoice.   IF you received labwork today, you will receive an invoice from [facility]. Please contact [facility] at [phone] with questions or concerns regarding your invoice.   Our billing staff will not be able to assist you with questions regarding bills from these companies.  You will be contacted with the lab results as soon as they are available. The fastest way to get your results is to activate your My Chart account. Instructions are located on the last page of this paperwork. If you have not heard from us regarding the results in 2 weeks, please contact this office.        IF you received an x-ray today, you will receive an invoice from [facility]. Please contact [facility] at [phone] with questions or concerns regarding your invoice.   IF you received labwork today, you will receive an invoice from [facility]. Please contact [facility] at [phone] with questions or concerns regarding your invoice.   Our billing staff will not be able to assist you with questions regarding bills from these companies.  You will be contacted with the lab results as soon as they are available. The fastest way to get your results is to activate your My Chart account. Instructions are located on the last page of this paperwork. If you have not heard from us regarding the results in 2 weeks, please contact this office.     

## 2017-10-03 NOTE — Progress Notes (Signed)
12/21/201810:44 AM  [name] Archerarol Prete 10-17-1950, 66 y.o. female [id number]  Chief Complaint  Patient presents with  . Cough    GREEN MUCUS    HPI:   Patient is a 66 y.o. female who presents today for productive cough that started yesterday with rhinorrhea and post nasal drip and malaise. She denies any SOB, fever or chills. She denies any sore throat but has a hoarse voice, denies any ear or sinus pain, but does have a headache. She reports recurring bronchitis and this feels like the start of it, she states she only gets better with omnicef. She has not taken any meds today. She has a remote h/o childhood asthma but not active in adulthood. She denies any significant smoking history.   Dep[facility] 2[facility] 10/03/2017 09/16/2017 11/08/2016  Decreased Interest 0 0 0  Down, Depressed, Hopeless 0 0 0  PHQ - 2 Score 0 0 0    Allergies  Allergen Reactions  . Codeine Itching  . Mobic [Meloxicam]     Dizzy/ lightheaded    Prior to Admission medications   Medication Sig Start Date End Date Taking? Authorizing Provider  celecoxib (CELEBREX) 100 MG capsule TAKE 1 CAPSULE (100 MG TOTAL) BY MOUTH 2 (TWO) TIMES DAILY. OFFICE VISIT NEEDED 09/19/17  Yes Shade FloodGreene, Jeffrey R, MD  metroNIDAZOLE (METROGEL) 0.75 % gel Apply 1 application topically 2 (two) times daily.   Yes [provider]    Past Medical History:  Diagnosis Date  . Allergy   . Anemia   . Asthma   . Bronchitis   . Hyperlipidemia     Past Surgical History:  Procedure Laterality Date  . CESAREAN SECTION    . TUBAL LIGATION      Social History   Tobacco Use  . Smoking status: Never Smoker  . Smokeless tobacco: Never Used  Substance Use Topics  . Alcohol use: No    Alcohol/week: 0.0 oz    Family History  Problem Relation Age of Onset  . Diabetes Paternal Grandmother   . Diabetes Paternal Grandfather     ROS Per hpi  OBJECTIVE:  Blood pressure 112/78, pulse 84, temperature 98.6 F (37 C),  temperature source Oral, weight 170 lb (77.1 kg), SpO2 95 %.  Physical Exam  Constitutional: She is oriented to person, place, and time and well-developed, well-nourished, and in no distress.  HENT:  Head: Normocephalic and atraumatic.  Right Ear: Hearing, tympanic membrane, external ear and ear canal normal.  Left Ear: Hearing, tympanic membrane, external ear and ear canal normal.  Nose: Mucosal edema and rhinorrhea present. Right sinus exhibits no maxillary sinus tenderness and no frontal sinus tenderness. Left sinus exhibits no maxillary sinus tenderness and no frontal sinus tenderness.  Mouth/Throat: Oropharynx is clear and moist.  Eyes: EOM are normal. Pupils are equal, round, and reactive to light.  Neck: Neck supple.  Cardiovascular: Normal rate, regular rhythm and normal heart sounds. Exam reveals no gallop and no friction rub.  No murmur heard. Pulmonary/Chest: Effort normal and breath sounds normal. She has no wheezes. She has no rales.  Lymphadenopathy:    She has no cervical adenopathy.  Neurological: She is alert and oriented to person, place, and time. Gait normal.  Skin: Skin is warm and dry.    ASSESSMENT and PLAN  1. URI with cough and congestion Discussed supportive measures for URI: increase hydration, rest, OTC medications, etc. RTC precautions discussed.  Return if symptoms worsen or fail to improve.    Jahari Billy  Reubin Milan, MD Primary Care at [place], [place] [address] Ph.  [phone] Fax [phone]

## 2017-10-06 ENCOUNTER — Telehealth: Payer: Self-pay | Admitting: Family Medicine

## 2017-10-06 NOTE — Telephone Encounter (Signed)
Called patient informed her I would forward a message on to Dr. Leretha PolSantiago  that she wasn't feeling better and wanted antibiotic.   But that it did state in her office visit she would need to RTC if not feeling better.    Patient screamed at me for ruining her day and hung up.

## 2017-10-06 NOTE — Telephone Encounter (Signed)
Copied from CRM [phone]. Topic: Quick Communication - See Telephone Encounter >> Oct 06, 2017 10:08 AM Rudi CocoLathan, Chasitee Zenker M, NT wrote: CRM for notification. See Telephone encounter for:   10/06/17. Pt . Calling to see if she can get an antibiotic (omnicef) pt can be reached at [phone]. Pt. Said she doesn't have the flu she had bronchitis  [id number] - [place], [place] - [address] [facility] [address] [place] [id number] Phone: [phone] Fax: [phone]

## 2017-10-08 NOTE — Telephone Encounter (Signed)
noted 

## 2017-10-19 ENCOUNTER — Other Ambulatory Visit: Payer: Self-pay | Admitting: Family Medicine

## 2017-10-19 DIAGNOSIS — M2550 Pain in unspecified joint: Secondary | ICD-10-CM

## 2017-10-20 NOTE — Telephone Encounter (Signed)
I prescribed #60 with 1 refill on December 4th. She should have one refill left on that prescription. If not, let me know.

## 2017-11-16 ENCOUNTER — Other Ambulatory Visit: Payer: Self-pay | Admitting: Family Medicine

## 2017-11-16 DIAGNOSIS — M2550 Pain in unspecified joint: Secondary | ICD-10-CM

## 2017-12-11 ENCOUNTER — Encounter: Payer: Self-pay | Admitting: Family Medicine

## 2018-07-27 ENCOUNTER — Ambulatory Visit (HOSPITAL_COMMUNITY)
Admission: EM | Admit: 2018-07-27 | Discharge: 2018-07-27 | Disposition: A | Discharge status: Home / Self Care | Payer: Medicare PPO | Attending: Family Medicine | Admitting: Family Medicine

## 2018-07-27 ENCOUNTER — Encounter (HOSPITAL_COMMUNITY): Payer: Self-pay

## 2018-07-27 DIAGNOSIS — J209 Acute bronchitis, unspecified: Secondary | ICD-10-CM

## 2018-07-27 MED ORDER — BENZONATATE 100 MG PO CAPS: 100.0000 mg | ORAL_CAPSULE | Freq: Three times a day (TID) | ORAL | 0 refills | Status: DC

## 2018-07-27 MED ORDER — CEFDINIR 300 MG PO CAPS: 300.0000 mg | ORAL_CAPSULE | Freq: Two times a day (BID) | ORAL | 0 refills | Status: DC

## 2018-07-27 NOTE — ED Provider Notes (Signed)
[facility]    CSN: [id number] Arrival date & time: 07/27/18  1737     History   Chief Complaint Chief Complaint  Patient presents with  . Cough    HPI [name] Young is a 67 y.o. female.   Patient has a history of acute cough but also history of chronic cough usually about this time a year.  She has been treated with antibiotic and Tessalon before always with good results she tells me.  She is a non-smoker.  She had asthma as a child.  Cough is occasionally productive of green sputum.  HPI  Past Medical History:  Diagnosis Date  . Allergy   . Anemia   . Asthma   . Bronchitis   . Hyperlipidemia     Patient Active Problem List   Diagnosis Date Noted  . Seasonal allergies 10/30/2012    Past Surgical History:  Procedure Laterality Date  . CESAREAN SECTION    . TUBAL LIGATION      OB History   None      Home Medications    Prior to Admission medications   Medication Sig Start Date End Date Taking? Authorizing Provider  celecoxib (CELEBREX) 100 MG capsule TAKE 1 CAPSULE (100 MG TOTAL) BY MOUTH 2 (TWO) TIMES DAILY. OFFICE VISIT NEEDED 11/17/17   Shade Flood, MD  metroNIDAZOLE (METROGEL) 0.75 % gel Apply 1 application topically 2 (two) times daily.    [provider]    Family History Family History  Problem Relation Age of Onset  . Diabetes Paternal Grandmother   . Diabetes Paternal Grandfather     Social History Social History   Tobacco Use  . Smoking status: Never Smoker  . Smokeless tobacco: Never Used  Substance Use Topics  . Alcohol use: No    Alcohol/week: 0.0 standard drinks  . Drug use: No     Allergies   Codeine and Mobic [meloxicam]   Review of Systems Review of Systems  Constitutional: Negative.   HENT: Positive for congestion and sinus pressure.   Respiratory: Positive for cough.   Cardiovascular: Negative.   Gastrointestinal: Negative.   Musculoskeletal: Negative.   Neurological: Negative.       Physical Exam Triage Vital Signs ED Triage Vitals  Enc Vitals Group     BP 07/27/18 1841 (!) 145/68     Pulse Rate 07/27/18 1841 63     Resp 07/27/18 1841 20     Temp 07/27/18 1841 97.9 F (36.6 C)     Temp Source 07/27/18 1841 Oral     SpO2 07/27/18 1841 98 %     Weight --      Height --      Head Circumference --      Peak Flow --      Pain Score 07/27/18 1844 4     Pain Loc --      Pain Edu? --      Excl. in GC? --    No data found.  Updated Vital Signs BP (!) 145/68 (BP Location: Left Arm)   Pulse 63   Temp 97.9 F (36.6 C) (Oral)   Resp 20   SpO2 98%   Visual Acuity Right Eye Distance:   Left Eye Distance:   Bilateral Distance:    Right Eye Near:   Left Eye Near:    Bilateral Near:     Physical Exam  Constitutional: She is oriented to person, place, and time. She appears well-developed and well-nourished.  HENT:  Head: Normocephalic.  Mouth/Throat: Oropharynx is clear and moist.  Neck: Normal range of motion.  Cardiovascular: Normal rate, regular rhythm and normal heart sounds.  Pulmonary/Chest: Effort normal and breath sounds normal.  Neurological: She is alert and oriented to person, place, and time.  Psychiatric: She has a normal mood and affect.     UC Treatments / Results  Labs (all labs ordered are listed, but only abnormal results are displayed) Labs Reviewed - No data to display  EKG None  Radiology No results found.  Procedures Procedures (including critical care time)  Medications Ordered in UC Medications - No data to display  Initial Impression / Assessment and Plan / UC Course  I have reviewed the triage vital signs and the nursing notes.  Pertinent labs & imaging results that were available during my care of the patient were reviewed by me and considered in my medical decision making (see chart for details).     Bronchitis.  Will treat with Ceftin ear and Tessalon patient is going out of town in 2 days. Final  Clinical Impressions(s) / UC Diagnoses   Final diagnoses:  None   Discharge Instructions   None    ED Prescriptions    None     Controlled Substance Prescriptions [place] Controlled Substance Registry consulted? No   Frederica Kuster, MD 07/27/18 1900

## 2018-07-27 NOTE — ED Triage Notes (Signed)
Pt presents with bronchial cough that is not getting better with OTC medication.

## 2018-07-30 ENCOUNTER — Other Ambulatory Visit: Payer: Self-pay | Admitting: Family Medicine

## 2019-02-23 ENCOUNTER — Encounter (HOSPITAL_BASED_OUTPATIENT_CLINIC_OR_DEPARTMENT_OTHER): Payer: Self-pay

## 2019-02-23 ENCOUNTER — Other Ambulatory Visit: Payer: Self-pay

## 2019-02-23 ENCOUNTER — Emergency Department (HOSPITAL_BASED_OUTPATIENT_CLINIC_OR_DEPARTMENT_OTHER): Payer: Medicare PPO

## 2019-02-23 ENCOUNTER — Inpatient Hospital Stay (HOSPITAL_BASED_OUTPATIENT_CLINIC_OR_DEPARTMENT_OTHER)
Admission: EM | Admit: 2019-02-23 | Discharge: 2019-02-26 | DRG: 419 | Disposition: A | Discharge status: Home / Self Care | Payer: Medicare PPO | Attending: Physician Assistant | Admitting: Physician Assistant

## 2019-02-23 DIAGNOSIS — K8012 Calculus of gallbladder with acute and chronic cholecystitis without obstruction: Secondary | ICD-10-CM | POA: Diagnosis not present

## 2019-02-23 DIAGNOSIS — M19042 Primary osteoarthritis, left hand: Secondary | ICD-10-CM | POA: Diagnosis present

## 2019-02-23 DIAGNOSIS — R739 Hyperglycemia, unspecified: Secondary | ICD-10-CM | POA: Diagnosis present

## 2019-02-23 DIAGNOSIS — E785 Hyperlipidemia, unspecified: Secondary | ICD-10-CM | POA: Diagnosis not present

## 2019-02-23 DIAGNOSIS — Z79899 Other long term (current) drug therapy: Secondary | ICD-10-CM

## 2019-02-23 DIAGNOSIS — K8010 Calculus of gallbladder with chronic cholecystitis without obstruction: Secondary | ICD-10-CM | POA: Diagnosis present

## 2019-02-23 DIAGNOSIS — Z1159 Encounter for screening for other viral diseases: Secondary | ICD-10-CM

## 2019-02-23 DIAGNOSIS — M19041 Primary osteoarthritis, right hand: Secondary | ICD-10-CM | POA: Diagnosis not present

## 2019-02-23 DIAGNOSIS — J45909 Unspecified asthma, uncomplicated: Secondary | ICD-10-CM | POA: Diagnosis present

## 2019-02-23 DIAGNOSIS — K819 Cholecystitis, unspecified: Secondary | ICD-10-CM

## 2019-02-23 DIAGNOSIS — R1011 Right upper quadrant pain: Secondary | ICD-10-CM | POA: Diagnosis not present

## 2019-02-23 DIAGNOSIS — E860 Dehydration: Secondary | ICD-10-CM | POA: Diagnosis not present

## 2019-02-23 DIAGNOSIS — K810 Acute cholecystitis: Secondary | ICD-10-CM

## 2019-02-23 DIAGNOSIS — R1013 Epigastric pain: Secondary | ICD-10-CM

## 2019-02-23 LAB — CBC WITH DIFFERENTIAL/PLATELET
Abs Immature Granulocytes: 0.05 10*3/uL (ref 0.00–0.07)
Basophils Absolute: 0 10*3/uL (ref 0.0–0.1)
Basophils Relative: 0 %
Eosinophils Absolute: 0 10*3/uL (ref 0.0–0.5)
Eosinophils Relative: 0 %
HCT: 47 % — ABNORMAL HIGH (ref 36.0–46.0)
Hemoglobin: 15.8 g/dL — ABNORMAL HIGH (ref 12.0–15.0)
Immature Granulocytes: 0 %
Lymphocytes Relative: 5 %
Lymphs Abs: 0.6 10*3/uL — ABNORMAL LOW (ref 0.7–4.0)
MCH: 29.6 pg (ref 26.0–34.0)
MCHC: 33.6 g/dL (ref 30.0–36.0)
MCV: 88 fL (ref 80.0–100.0)
Monocytes Absolute: 0.2 10*3/uL (ref 0.1–1.0)
Monocytes Relative: 1 %
Neutro Abs: 12.3 10*3/uL — ABNORMAL HIGH (ref 1.7–7.7)
Neutrophils Relative %: 94 %
Platelets: 299 10*3/uL (ref 150–400)
RBC: 5.34 MIL/uL — ABNORMAL HIGH (ref 3.87–5.11)
RDW: 12 % (ref 11.5–15.5)
WBC: 13.2 10*3/uL — ABNORMAL HIGH (ref 4.0–10.5)
nRBC: 0 % (ref 0.0–0.2)

## 2019-02-23 LAB — LIPASE, BLOOD: Lipase: 24 U/L (ref 11–51)

## 2019-02-23 LAB — URINALYSIS, ROUTINE W REFLEX MICROSCOPIC
Bilirubin Urine: NEGATIVE
Glucose, UA: NEGATIVE mg/dL
Ketones, ur: 40 mg/dL — AB
Leukocytes,Ua: NEGATIVE
Nitrite: NEGATIVE
Protein, ur: NEGATIVE mg/dL
Specific Gravity, Urine: >1.03 — ABNORMAL HIGH (ref 1.005–1.030)
pH: 6 (ref 5.0–8.0)

## 2019-02-23 LAB — SARS CORONAVIRUS 2 AG (30 MIN TAT): SARS Coronavirus 2 Ag: NEGATIVE

## 2019-02-23 LAB — COMPREHENSIVE METABOLIC PANEL
ALT: 28 U/L (ref 0–44)
AST: 29 U/L (ref 15–41)
Albumin: 4.9 g/dL (ref 3.5–5.0)
Alkaline Phosphatase: 79 U/L (ref 38–126)
Anion gap: 11 (ref 5–15)
BUN: 19 mg/dL (ref 8–23)
CO2: 26 mmol/L (ref 22–32)
Calcium: 10 mg/dL (ref 8.9–10.3)
Chloride: 103 mmol/L (ref 98–111)
Creatinine, Ser: 0.81 mg/dL (ref 0.44–1.00)
GFR calc Af Amer: >60 mL/min (ref >60)
GFR calc non Af Amer: >60 mL/min (ref >60)
Glucose, Bld: 148 mg/dL — ABNORMAL HIGH (ref 70–99)
Potassium: 3.7 mmol/L (ref 3.5–5.1)
Sodium: 140 mmol/L (ref 135–145)
Total Bilirubin: 1.6 mg/dL — ABNORMAL HIGH (ref 0.3–1.2)
Total Protein: 8.4 g/dL — ABNORMAL HIGH (ref 6.5–8.1)

## 2019-02-23 LAB — URINALYSIS, MICROSCOPIC (REFLEX)

## 2019-02-23 MED ORDER — SODIUM CHLORIDE 0.9 % IV SOLN
Freq: Once | INTRAVENOUS | Status: AC
  Administered 2019-02-23: 22:00:00 via INTRAVENOUS

## 2019-02-23 MED ORDER — SODIUM CHLORIDE 0.9 % IV BOLUS
1000.0000 mL | Freq: Once | INTRAVENOUS | Status: AC
  Administered 2019-02-23: 1000 mL via INTRAVENOUS

## 2019-02-23 MED ORDER — SODIUM CHLORIDE 0.9 % IV SOLN
2.0000 g | Freq: Once | INTRAVENOUS | Status: AC
  Administered 2019-02-23: 2 g via INTRAVENOUS
  Filled 2019-02-23: qty 20

## 2019-02-23 MED ORDER — ONDANSETRON HCL 4 MG/2ML IJ SOLN
4.0000 mg | Freq: Once | INTRAMUSCULAR | Status: AC
  Administered 2019-02-23: 4 mg via INTRAVENOUS
  Filled 2019-02-23: qty 2

## 2019-02-23 MED ORDER — MORPHINE SULFATE (PF) 4 MG/ML IV SOLN
4.0000 mg | Freq: Once | INTRAVENOUS | Status: AC
  Administered 2019-02-23: 17:00:00 4 mg via INTRAVENOUS
  Filled 2019-02-23: qty 1

## 2019-02-23 MED ORDER — HYDROMORPHONE HCL 1 MG/ML IJ SOLN
1.0000 mg | Freq: Once | INTRAMUSCULAR | Status: AC
  Administered 2019-02-23: 1 mg via INTRAVENOUS
  Filled 2019-02-23: qty 1

## 2019-02-23 NOTE — ED Triage Notes (Signed)
C/o abd pain, n/v/d sx started last night after eating [employer] chicken-to tx area via w/c

## 2019-02-23 NOTE — ED Notes (Signed)
Pt was placed on a purewick.  

## 2019-02-23 NOTE — ED Notes (Signed)
amb to [facility] for u/a 

## 2019-02-23 NOTE — ED Provider Notes (Signed)
Patient accepted from [facility] for acute cholecystitis. Covid negative.  Ultrasound consistent with acute cholecystitis.  Rocephin, IV fluids given.  Will be admitted to surgery.  Urinalysis also consistent with dehydration.  IV fluids given.   Shaune Pollack, MD 02/23/19 2152

## 2019-02-23 NOTE — ED Provider Notes (Signed)
[facility] EMERGENCY DEPARTMENT Provider Note   CSN: [id number] Arrival date & time: 02/23/19  1613  History   Chief Complaint Chief Complaint  Patient presents with  . Abdominal Pain   HPI [name] ArcherCarol Young is a 68 y.o. female with past medical history significant for anemia, asthma, hyperlipidemia who presents for evaluation of abdominal pain.  Patient has had intermittent abdominal pain x4 weeks.  Pain worse with food intake.  Patient states she ate [employer] yesterday evening developed diffuse epigastric abdominal pain with radiation to RUQ.  Has also had 2 episodes of nonbilious, nonbloody emesis with associated 1 episode of nonbloody diarrhea.  States she has had prior history of cesarean sections, however no additional abdominal surgeries.  Denies known history of gallstones.  Rates her pain a 7/10. Denies fever, chills, chest pain, shortness of breath, dysuria, constipation, pelvic pain, melena, hematemesis, hematochezia.  Has not taken anything for symptoms PTA.  Denies any additional aggravating or alleviating factors. Denies NSAID use, alcohol use, hx of pancreatitis or gastric ulcers. Patient states she feels overall weak after her episodes of vomiting and diarrhea. Denies facial droop, phonation changes, sudden onset thunderclap HA, unilateral weakness, numbness or tingling in her extremities. No hx of prior CVA.  Last PO intake yesterday at 1900 Prior ABD surgeries-- C/S x2  History obtained from patient. No interpretor was used.     HPI  Past Medical History:  Diagnosis Date  . Allergy   . Anemia   . Asthma   . Bronchitis   . Hyperlipidemia     Patient Active Problem List   Diagnosis Date Noted  . Seasonal allergies 10/30/2012    Past Surgical History:  Procedure Laterality Date  . CESAREAN SECTION    . TUBAL LIGATION       OB History   No obstetric history on file.      Home Medications    Prior to Admission medications   Medication Sig Start Date  End Date Taking? Authorizing Provider  celecoxib (CELEBREX) 100 MG capsule TAKE 1 CAPSULE (100 MG TOTAL) BY MOUTH 2 (TWO) TIMES DAILY. OFFICE VISIT NEEDED 11/17/17  Yes Shade FloodGreene, Jeffrey R, MD  benzonatate (TESSALON) 100 MG capsule Take 1 capsule (100 mg total) by mouth 3 (three) times daily. 07/27/18   Frederica KusterMiller, Stephen M, MD  cefdinir (OMNICEF) 300 MG capsule Take 1 capsule (300 mg total) by mouth 2 (two) times daily. 07/27/18   Frederica KusterMiller, Stephen M, MD  metroNIDAZOLE (METROGEL) 0.75 % gel Apply 1 application topically 2 (two) times daily.    [provider]    Family History Family History  Problem Relation Age of Onset  . Diabetes Paternal Grandmother   . Diabetes Paternal Grandfather     Social History Social History   Tobacco Use  . Smoking status: Never Smoker  . Smokeless tobacco: Never Used  Substance Use Topics  . Alcohol use: No    Alcohol/week: 0.0 standard drinks  . Drug use: No     Allergies   Codeine and Mobic [meloxicam]   Review of Systems Review of Systems  Constitutional: Negative.   HENT: Negative.   Respiratory: Negative.   Cardiovascular: Negative.   Gastrointestinal: Positive for abdominal pain, diarrhea, nausea and vomiting. Negative for abdominal distention, anal bleeding, blood in stool, constipation and rectal pain.  Genitourinary: Negative.   Musculoskeletal: Negative.   Skin: Negative.   Neurological: Negative.   All other systems reviewed and are negative.  Physical Exam Updated Vital Signs BP Marland Kitchen(!)  148/72 (BP Location: Left Arm)   Pulse 68   Temp 98.6 F (37 C) (Oral)   Resp 16   Ht 5\' 1"  (1.549 m)   Wt 77.1 kg   SpO2 96%   BMI 32.12 kg/m   Physical Exam Vitals signs and nursing note reviewed.  Constitutional:      General: She is not in acute distress.    Appearance: She is well-developed. She is not ill-appearing, toxic-appearing or diaphoretic.  HENT:     Head: Normocephalic and atraumatic.     Mouth/Throat:      Comments: Mucous membranes moist. Posterior oropharynx clear. Eyes:     Pupils: Pupils are equal, round, and reactive to light.     Comments: No horizontal, vertical or rotational nystagmus   Neck:     Musculoskeletal: Normal range of motion.     Comments:  Full active and passive ROM without pain No midline or paraspinal tenderness No nuchal rigidity or meningeal signs  Cardiovascular:     Rate and Rhythm: Normal rate.     Heart sounds: Normal heart sounds. No murmur. No friction rub. No gallop.   Pulmonary:     Effort: No respiratory distress.     Comments: Clear to auscultation bilaterally without wheeze, rhonchi or rales.  Speaks in full sentences without difficulty. No assessory muscle usage. Abdominal:     General: There is no distension.     Tenderness: There is abdominal tenderness. Positive signs include Murphy's sign.     Comments: Diffuse tenderness to right upper quadrant and epigastric region.  Normoactive bowel sounds.  Negative CVA tenderness.  There is no guarding or rebound. No obvious abd wall herniations or abd wall skin changes.  Musculoskeletal: Normal range of motion.  Skin:    General: Skin is warm and dry.     Comments: Brisk cap refill. No rashes or lesions.  Neurological:     Mental Status: She is alert.     Comments: Mental Status:  Alert, oriented, thought content appropriate. Speech fluent without evidence of aphasia. Able to follow 2 step commands without difficulty.  Cranial Nerves:  II:  Peripheral visual fields grossly normal, pupils equal, round, reactive to light III,IV, VI: ptosis not present, extra-ocular motions intact bilaterally  V,VII: smile symmetric, facial light touch sensation equal VIII: hearing grossly normal bilaterally  IX,X: midline uvula rise  XI: bilateral shoulder shrug equal and strong XII: midline tongue extension  Motor:  5/5 in upper and lower extremities bilaterally including strong and equal grip strength and  dorsiflexion/plantar flexion Sensory: Pinprick and light touch normal in all extremities.  Deep Tendon Reflexes: 2+ and symmetric  Cerebellar: normal finger-to-nose with bilateral upper extremities Gait: normal gait and balance CV: distal pulses palpable throughout      ED Treatments / Results  Labs (all labs ordered are listed, but only abnormal results are displayed) Labs Reviewed  CBC WITH DIFFERENTIAL/PLATELET - Abnormal; Notable for the following components:      Result Value   WBC 13.2 (*)    RBC 5.34 (*)    Hemoglobin 15.8 (*)    HCT 47.0 (*)    Neutro Abs 12.3 (*)    Lymphs Abs 0.6 (*)    All other components within normal limits  COMPREHENSIVE METABOLIC PANEL - Abnormal; Notable for the following components:   Glucose, Bld 148 (*)    Total Protein 8.4 (*)    Total Bilirubin 1.6 (*)    All other components within normal limits  URINALYSIS, ROUTINE W REFLEX MICROSCOPIC - Abnormal; Notable for the following components:   Specific Gravity, Urine >1.030 (*)    Hgb urine dipstick TRACE (*)    Ketones, ur 40 (*)    All other components within normal limits  URINALYSIS, MICROSCOPIC (REFLEX) - Abnormal; Notable for the following components:   Bacteria, UA RARE (*)    All other components within normal limits  SARS CORONAVIRUS 2 (HOSP ORDER, PERFORMED IN [place] LAB VIA ABBOTT ID)  LIPASE, BLOOD    EKG EKG Interpretation  Date/Time:  Tuesday Feb 23 2019 16:52:11 EDT Ventricular Rate:  61 PR Interval:    QRS Duration: 90 QT Interval:  436 QTC Calculation: 440 R Axis:   -4 Text Interpretation:  Sinus rhythm Baseline wander in lead(s) I III aVR aVL V1 No previous tracing Confirmed by Gwyneth Sprout ([id number]) on 02/23/2019 5:27:44 PM   Radiology US Abdomen Limited Ruq  Result Date: 02/23/2019 CLINICAL DATA:  Upper abdominal EXAM: ULTRASOUND ABDOMEN LIMITED RIGHT UPPER QUADRANT COMPARISON:  None. FINDINGS: Gallbladder: Within the gallbladder, there are multiple  echogenic foci which move and shadow consistent with cholelithiasis. There is intermingled sludge. Largest individual gallstone measures 4 mm. Gallbladder appears mildly hypervascular with wall thickening and pericholecystic fluid. No sonographic Murphy sign noted by sonographer. Common bile duct: Diameter: 9 mm, mildly dilated. No biliary duct mass or calculus is appreciable. Note that portions of the distal common bile duct are obscured by gas. Liver: No focal lesion identified. Within normal limits in parenchymal echogenicity. Portal vein is patent on color Doppler imaging with normal direction of blood flow towards the liver. Incidental note is made of a mildly septated cyst arising from the upper pole of the right kidney measuring 3.0 x 2.9 x 2.0 cm IMPRESSION: Cholelithiasis with gallbladder wall thickening and pericholecystic fluid, felt to be indicative of acute cholecystitis. Gallbladder is mildly hypervascular. Common bile duct dilated at 9 mm. No biliary duct mass or calculus is appreciable. Note, however, that portions of the distal common bile duct are obscured by gas. Mildly septated cyst arising from upper pole right kidney. Electronically Signed   By: Bretta Bang III M.D.   On: 02/23/2019 18:10    Procedures Procedures (including critical care time)  Medications Ordered in ED Medications  sodium chloride 0.9 % bolus 1,000 mL (0 mLs Intravenous Stopped 02/23/19 1749)  ondansetron (ZOFRAN) injection 4 mg (4 mg Intravenous Given 02/23/19 1651)  morphine 4 MG/ML injection 4 mg (4 mg Intravenous Given 02/23/19 1652)  HYDROmorphone (DILAUDID) injection 1 mg (1 mg Intravenous Given 02/23/19 1822)  cefTRIAXone (ROCEPHIN) 2 g in sodium chloride 0.9 % 100 mL IVPB (0 g Intravenous Stopped 02/23/19 1910)   Initial Impression / Assessment and Plan / ED Course  I have reviewed the triage vital signs and the nursing notes.  Pertinent labs & imaging results that were available during my care of the  patient were reviewed by me and considered in my medical decision making (see chart for details).  68 year old female appears otherwise well presents for evaluation abdominal pain.  History of intermittent abdominal pain x4 weeks, however worse yesterday evening after eating [employer].  Pain located epigastric and right upper quadrant. Murphy sign positive. Denies history of known gallstones.  Has had 2 episodes of nonbloody, nonbilious emesis as well as multiple episodes of nonbloody diarrhea.  She has no rebound or guarding on exam.  She has no abdominal wall skin changes or evidence of abdominal wall herniations. No melena,  hematochezia or hematemesis. Low suspicion for acute GI bleed. Mucous membranes moist.  She does not have any radiation of pain into her chest or arms.  I have low suspicion for ACS as cause of her symptoms.  She is not tachycardic, tachypneic or hypoxic.  She has no upper respiratory symptoms. Concern for gallbladder pathology as cause of her pain. She has also had generalized weakness since emesis and diarrhea. Normal neuro exam without neuro deficit. Low suspicion for CVA as cause of her weakness. Likely due to her emesis and diarrhea. Will obtain labs, urine and [place] to reevaluate. If negative [place] will plan for CT abd.  1800: Continued pain to RUQ. Will order Dilaudid as Morphine did not help with pain control. [place] Pending. No episodes of emesis or diarrhea in ED.  Labs and imaging personally reviewed: CBC:13.2, Hgb 15.8  CMP: Hyperglycemia at 148, total bili 1.6, no additional LFT elevated to suggest CBD obstruction. Lipase:24 Urinalysis: Trace Hgb and ketones EKG: Without ST/T changes. No STEMI. [place] RUQ: Gallbladder wall thickening with stones and sludge consistent with cholelithiasis with acute cholecystitis  1820: Will consult with General Surgery. COVID obtained.  1845: Consulted with Dr. Ezzard Standing from general surgery. Recommends transfer to [facility] ED for evaluation. Patient given  Rocephin for ABX. Currently NPO. hemodynamically stable and appropriate for transfer at this time.  Consulted with Dr. Clarene Duke from [facility] ED who accepts patient in transfer.  Patient has been seen and evaluated by my attending Dr. Anitra Lauth who agrees with treatment, plan and disposition.      Final Clinical Impressions(s) / ED Diagnoses   Final diagnoses:  Epigastric abdominal pain  Cholecystitis    ED Discharge Orders    None       Rage Beever A, PA-C 02/23/19 2135    Gwyneth Sprout, MD 02/23/19 2157

## 2019-02-23 NOTE — ED Notes (Signed)
[facility] notified (Taryn) - patient ready for transport 

## 2019-02-23 NOTE — H&P (Signed)
Re:   [name] Young DOB:   12-08-1950 MRN:   [id number]  Chief Complaint Abdominal pain  ASSESEMENT AND PLAN: 1.  Cholecystitis, cholelithiasis  I discussed with the patient (and her husband by phone) the indications and risks of gall bladder surgery.  The primary risks of gall bladder surgery include, but are not limited to, bleeding, infection, common bile duct injury, and open surgery.  There is also the risk that the patient may have continued symptoms after surgery.  We discussed the typical post-operative recovery course. I tried to answer the patient's questions.  2.  Asthma 3.  Hyperlipidemia 4.   Sedated with meds which makes getting a history difficult.   Chief Complaint  Patient presents with  . Abdominal Pain   PHYSICIAN REQUESTING CONSULTATION:  Shelby Dubin, PA, [facility]  HISTORY OF PRESENT ILLNESS: [name] Young is a 68 y.o. (DOB: 12-31-1950)  white female whose primary care physician is Seward Juri, MD.   The patient herself is a poor historian.  She has had some medication today which is made it difficult for her to get her thoughts together.  I spoke to her husband who gave a more coherent history.  The patient has moved from [place] with [employer] in the last couple of weeks.  She also had a car that was not working, but she was focused on getting this car started.  It sounds like last night she had nausea and vomiting throughout the night.  She developed some epigastric abdominal pain more on the right side.  She talked to Dr. Maudry Mayhew, her PCP, but she has never met him.  She went to [place] who evaluated her.       Her only prior abdominal surgery was 2 C-sections and a diagnostic laparoscopy.  [place] of abdomen - Cholelithiasis with gallbladder wall thickening and pericholecystic fluid, felt to be indicative of acute cholecystitis. Gallbladder is mildly hypervascular.  Common bile duct dilated at 9 mm. No biliary duct mass or  calculus is appreciable. Note, however, that portions of the distal common bile duct are obscured by gas.  WBC - 13,200 - 02/23/2019 T Bili - 1.6 - 02/23/2019    Past Medical History:  Diagnosis Date  . Allergy   . Anemia   . Asthma   . Bronchitis   . Hyperlipidemia       Past Surgical History:  Procedure Laterality Date  . CESAREAN SECTION    . TUBAL LIGATION        No current facility-administered medications for this encounter.    Current Outpatient Medications  Medication Sig Dispense Refill  . celecoxib (CELEBREX) 100 MG capsule TAKE 1 CAPSULE (100 MG TOTAL) BY MOUTH 2 (TWO) TIMES DAILY. OFFICE VISIT NEEDED 60 capsule 0  . metroNIDAZOLE (METROGEL) 0.75 % gel Apply 1 application topically 2 (two) times daily.        Allergies  Allergen Reactions  . Codeine Itching  . Mobic [Meloxicam]     Dizzy/ lightheaded    REVIEW OF SYSTEMS: Skin:  No history of rash.  No history of abnormal moles. Infection:  No history of hepatitis or HIV.  No history of MRSA. Neurologic:  No history of stroke.  No history of seizure.  No history of headaches. Cardiac:  No history of hypertension. No history of heart disease.  No history of prior cardiac catheterization.  No history of seeing a cardiologist. Pulmonary:  Asthma  Endocrine:  No diabetes.  No thyroid disease. Gastrointestinal:  See HPI Urologic:  No history of kidney stones.  No history of bladder infections. Musculoskeletal:  No history of joint or back disease. Hematologic:  No bleeding disorder.  No history of anemia.  Not anticoagulated. Psycho-social:  The patient is oriented.   The patient has no obvious psychologic or social impairment to understanding our conversation and plan.  SOCIAL and FAMILY HISTORY: Married.  Husband Patrick [place] [phone]) She has 3 sons. 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   PHYSICAL EXAM: BP (!) 147/84 (BP Location: Left Arm)   Pulse 68   Temp 98.6 F (37 C) (Oral)   Resp 18   Ht '5\' 1"'  (1.549 m)   Wt 77.1 kg   SpO2 95%   BMI 32.12 kg/m   General: WN WF who is alert.  But she had trouble giving a coherent history  Skin:  Inspection and palpation - no mass or rash. Eyes:  Conjunctiva and lids unremarkable.            Pupils are equal Ears, Nose, Mouth, and Throat:  Ears and nose unremarkable            Lips and teeth are unremarable. Neck: Supple. No mass, trachea midline.  No thyroid mass. Lymph Nodes:  No supraclavicular, cervical, or inguinal nodes. Lungs: Normal respiratory effort.  Clear to auscultation and symmetric breath sounds. Heart:  Palpation of the heart is normal.            Auscultation: RRR. No murmur or rub.  Abdomen: Soft. No mass. No hernia. Decreased bowel sounds.    Right upper quadrant pain. Rectal: Not done. Musculoskeletal:  Good muscle strength and ROM  in upper and lower extremities.  Neurologic:  Grossly intact to motor and sensory function. Psychiatric: She was foggy from meds.  DATA REVIEWED, COUNSELING AND COORDINATION OF CARE: Epic notes reviewed. Counseling and coordination of care exceeded more than 50% of the time spent with patient. Total time spent with patient and charting: 45 minutes  Alphonsa Overall, MD,  [facility] Surgery, [address]  [place], [place]    [place] Phone:  [phone] FAX:  [phone]

## 2019-02-24 ENCOUNTER — Other Ambulatory Visit: Payer: Self-pay

## 2019-02-24 DIAGNOSIS — E860 Dehydration: Secondary | ICD-10-CM | POA: Diagnosis not present

## 2019-02-24 DIAGNOSIS — M19041 Primary osteoarthritis, right hand: Secondary | ICD-10-CM | POA: Diagnosis not present

## 2019-02-24 DIAGNOSIS — R739 Hyperglycemia, unspecified: Secondary | ICD-10-CM | POA: Diagnosis not present

## 2019-02-24 DIAGNOSIS — Z1159 Encounter for screening for other viral diseases: Secondary | ICD-10-CM | POA: Diagnosis not present

## 2019-02-24 DIAGNOSIS — K8012 Calculus of gallbladder with acute and chronic cholecystitis without obstruction: Secondary | ICD-10-CM | POA: Diagnosis not present

## 2019-02-24 DIAGNOSIS — E785 Hyperlipidemia, unspecified: Secondary | ICD-10-CM | POA: Diagnosis not present

## 2019-02-24 DIAGNOSIS — J45909 Unspecified asthma, uncomplicated: Secondary | ICD-10-CM | POA: Diagnosis not present

## 2019-02-24 DIAGNOSIS — K8010 Calculus of gallbladder with chronic cholecystitis without obstruction: Secondary | ICD-10-CM | POA: Diagnosis present

## 2019-02-24 DIAGNOSIS — Z79899 Other long term (current) drug therapy: Secondary | ICD-10-CM | POA: Diagnosis not present

## 2019-02-24 DIAGNOSIS — M19042 Primary osteoarthritis, left hand: Secondary | ICD-10-CM | POA: Diagnosis not present

## 2019-02-24 DIAGNOSIS — R1011 Right upper quadrant pain: Secondary | ICD-10-CM | POA: Diagnosis present

## 2019-02-24 HISTORY — DX: Calculus of gallbladder with chronic cholecystitis without obstruction: K80.10

## 2019-02-24 LAB — COMPREHENSIVE METABOLIC PANEL
ALT: 24 U/L (ref 0–44)
AST: 21 U/L (ref 15–41)
Albumin: 3.8 g/dL (ref 3.5–5.0)
Alkaline Phosphatase: 64 U/L (ref 38–126)
Anion gap: 10 (ref 5–15)
BUN: 14 mg/dL (ref 8–23)
CO2: 23 mmol/L (ref 22–32)
Calcium: 9 mg/dL (ref 8.9–10.3)
Chloride: 108 mmol/L (ref 98–111)
Creatinine, Ser: 0.78 mg/dL (ref 0.44–1.00)
GFR calc Af Amer: >60 mL/min (ref >60)
GFR calc non Af Amer: >60 mL/min (ref >60)
Glucose, Bld: 142 mg/dL — ABNORMAL HIGH (ref 70–99)
Potassium: 3.6 mmol/L (ref 3.5–5.1)
Sodium: 141 mmol/L (ref 135–145)
Total Bilirubin: 1 mg/dL (ref 0.3–1.2)
Total Protein: 6.8 g/dL (ref 6.5–8.1)

## 2019-02-24 LAB — CBC
HCT: 42.4 % (ref 36.0–46.0)
Hemoglobin: 13.9 g/dL (ref 12.0–15.0)
MCH: 30 pg (ref 26.0–34.0)
MCHC: 32.8 g/dL (ref 30.0–36.0)
MCV: 91.4 fL (ref 80.0–100.0)
Platelets: 260 10*3/uL (ref 150–400)
RBC: 4.64 MIL/uL (ref 3.87–5.11)
RDW: 12.4 % (ref 11.5–15.5)
WBC: 13.2 10*3/uL — ABNORMAL HIGH (ref 4.0–10.5)
nRBC: 0 % (ref 0.0–0.2)

## 2019-02-24 LAB — SURGICAL PCR SCREEN
MRSA, PCR: NEGATIVE
Staphylococcus aureus: NEGATIVE

## 2019-02-24 LAB — HIV ANTIBODY (ROUTINE TESTING W REFLEX): HIV Screen 4th Generation wRfx: NONREACTIVE

## 2019-02-24 MED ORDER — HYDROCODONE-ACETAMINOPHEN 5-325 MG PO TABS
1.0000 | ORAL_TABLET | ORAL | Status: DC | PRN
  Administered 2019-02-25: 1 via ORAL

## 2019-02-24 MED ORDER — ONDANSETRON 4 MG PO TBDP
4.0000 mg | ORAL_TABLET | Freq: Four times a day (QID) | ORAL | Status: DC | PRN
  Administered 2019-02-24: 01:00:00 4 mg via ORAL
  Filled 2019-02-24: qty 1

## 2019-02-24 MED ORDER — SODIUM CHLORIDE 0.9 % IV SOLN
2.0000 g | INTRAVENOUS | Status: DC
  Administered 2019-02-24 – 2019-02-26 (×3): 2 g via INTRAVENOUS
  Filled 2019-02-24: qty 2
  Filled 2019-02-24: qty 20
  Filled 2019-02-24: qty 2

## 2019-02-24 MED ORDER — TRAMADOL HCL 50 MG PO TABS
50.0000 mg | ORAL_TABLET | Freq: Four times a day (QID) | ORAL | Status: DC | PRN
  Administered 2019-02-24 – 2019-02-25 (×3): 50 mg via ORAL
  Filled 2019-02-24 (×4): qty 1

## 2019-02-24 MED ORDER — MORPHINE SULFATE (PF) 2 MG/ML IV SOLN: 1.0000 mg | INTRAVENOUS | Status: DC | PRN

## 2019-02-24 MED ORDER — METRONIDAZOLE IN NACL 5-0.79 MG/ML-% IV SOLN
500.0000 mg | Freq: Three times a day (TID) | INTRAVENOUS | Status: DC
  Administered 2019-02-24 – 2019-02-26 (×6): 500 mg via INTRAVENOUS
  Filled 2019-02-24 (×6): qty 100

## 2019-02-24 MED ORDER — ONDANSETRON HCL 4 MG/2ML IJ SOLN: 4.0000 mg | Freq: Four times a day (QID) | INTRAMUSCULAR | Status: DC | PRN

## 2019-02-24 MED ORDER — KCL IN DEXTROSE-NACL 20-5-0.45 MEQ/L-%-% IV SOLN
INTRAVENOUS | Status: DC
  Administered 2019-02-24 – 2019-02-25 (×2): via INTRAVENOUS
  Filled 2019-02-24 (×4): qty 1000

## 2019-02-24 NOTE — Progress Notes (Addendum)
   CC: abdominal pain  Subjective: Still having pain and anxious about surgery.  We are trying to get her done today.    Objective: Vital signs in last 24 hours: Temp:  [98.3 F (36.8 C)-98.8 F (37.1 C)] 98.8 F (37.1 C) (05/13 0538) Pulse Rate:  [57-74] 65 (05/13 0538) Resp:  [16-18] 18 (05/13 0538) BP: (140-176)/(71-84) 150/71 (05/13 0538) SpO2:  [94 %-100 %] 97 % (05/13 0538) Weight:  [77.1 kg] 77.1 kg (05/12 1627) Last BM Date: 02/23/19 724 IV N.p.o. 400 urine Afebrile vital signs are stable Labs are stable. WBC 13.2 abdominal ultrasound 5/12:  Cholelithiasis with gallbladder wall thickening and pericholecystic fluid, felt to be indicative of acute cholecystitis. Gallbladder is mildly hypervascular.  Intake/Output from previous day: 05/12 0701 - 05/13 0700 In: 724 [I.V.:724] Out: 400 [Urine:400] Intake/Output this shift: No intake/output data recorded.  General appearance: alert, cooperative and no distress Resp: clear to auscultation bilaterally GI: Tender,all over,but worst RUQ  Lab Results:  Recent Labs    02/23/19 1644 02/24/19 0528  WBC 13.2* 13.2*  HGB 15.8* 13.9  HCT 47.0* 42.4  PLT 299 260    BMET Recent Labs    02/23/19 1644 02/24/19 0528  NA 140 141  K 3.7 3.6  CL 103 108  CO2 26 23  GLUCOSE 148* 142*  BUN 19 14  CREATININE 0.81 0.78  CALCIUM 10.0 9.0   PT/INR No results for input(s): LABPROT, INR in the last 72 hours.  Recent Labs  Lab 02/23/19 1644 02/24/19 0528  AST 29 21  ALT 28 24  ALKPHOS 79 64  BILITOT 1.6* 1.0  PROT 8.4* 6.8  ALBUMIN 4.9 3.8     Lipase     Component Value Date/Time   LIPASE 24 02/23/2019 1644   Prior to Admission medications   Medication Sig Start Date End Date Taking? Authorizing Provider  celecoxib (CELEBREX) 100 MG capsule TAKE 1 CAPSULE (100 MG TOTAL) BY MOUTH 2 (TWO) TIMES DAILY. OFFICE VISIT NEEDED 11/17/17  Yes Shade Flood, MD  metroNIDAZOLE (METROGEL) 0.75 % gel Apply 1  application topically 2 (two) times daily.    [provider]    . cefTRIAXone (ROCEPHIN)  IV 2 g (02/24/19 0126)  . dextrose 5 % and 0.45 % NaCl with KCl 20 mEq/L 100 mL/hr at 02/24/19 0442  . metronidazole       Medications:   Assessment/Plan Arthritis both hands Asthma Hyperlipidemia  Cholecystitis/cholelithiasis  FEN: N.p.o./IV fluids ID: Rocephin 5/12 >> day 2 DVT: SCDs-Lovenox on hold for surgery Follow-up: [facility]   Plan: Continue antibiotics, aim for surgery later today if possible.   LOS: 0 days    [name] Young 02/24/2019 [phone]

## 2019-02-25 ENCOUNTER — Inpatient Hospital Stay (HOSPITAL_COMMUNITY): Payer: Medicare PPO | Admitting: Anesthesiology

## 2019-02-25 ENCOUNTER — Encounter (HOSPITAL_COMMUNITY): Payer: Self-pay | Admitting: Anesthesiology

## 2019-02-25 ENCOUNTER — Encounter (HOSPITAL_COMMUNITY): Admission: EM | Disposition: A | Discharge status: Home / Self Care | Payer: Self-pay | Source: Home / Self Care

## 2019-02-25 HISTORY — PX: CHOLECYSTECTOMY: SHX55

## 2019-02-25 SURGERY — LAPAROSCOPIC CHOLECYSTECTOMY WITH INTRAOPERATIVE CHOLANGIOGRAM
Anesthesia: General

## 2019-02-25 MED ORDER — FENTANYL CITRATE (PF) 100 MCG/2ML IJ SOLN
25.0000 ug | INTRAMUSCULAR | Status: DC | PRN
  Administered 2019-02-25: 50 ug via INTRAVENOUS

## 2019-02-25 MED ORDER — ACETAMINOPHEN 325 MG PO TABS: 325.0000 mg | ORAL_TABLET | Freq: Once | ORAL | Status: DC

## 2019-02-25 MED ORDER — LACTATED RINGERS IV SOLN
INTRAVENOUS | Status: DC
  Administered 2019-02-25 (×2): via INTRAVENOUS

## 2019-02-25 MED ORDER — OXYCODONE HCL 5 MG PO TABS
5.0000 mg | ORAL_TABLET | ORAL | Status: DC | PRN
  Administered 2019-02-26: 5 mg via ORAL
  Filled 2019-02-25: qty 1

## 2019-02-25 MED ORDER — MEPERIDINE HCL 50 MG/ML IJ SOLN: 6.2500 mg | INTRAMUSCULAR | Status: DC | PRN

## 2019-02-25 MED ORDER — PROMETHAZINE HCL 25 MG/ML IJ SOLN: 6.2500 mg | INTRAMUSCULAR | Status: DC | PRN

## 2019-02-25 MED ORDER — MIDAZOLAM HCL 2 MG/2ML IJ SOLN
INTRAMUSCULAR | Status: AC
  Filled 2019-02-25: qty 2

## 2019-02-25 MED ORDER — BUPIVACAINE-EPINEPHRINE 0.25% -1:200000 IJ SOLN
INTRAMUSCULAR | Status: DC | PRN
  Administered 2019-02-25: 20 mL

## 2019-02-25 MED ORDER — BUPIVACAINE-EPINEPHRINE (PF) 0.25% -1:200000 IJ SOLN
INTRAMUSCULAR | Status: AC
  Filled 2019-02-25: qty 30

## 2019-02-25 MED ORDER — MIDAZOLAM HCL 5 MG/5ML IJ SOLN
INTRAMUSCULAR | Status: DC | PRN
  Administered 2019-02-25: 2 mg via INTRAVENOUS

## 2019-02-25 MED ORDER — ACETAMINOPHEN 160 MG/5ML PO SOLN: 325.0000 mg | Freq: Once | ORAL | Status: DC

## 2019-02-25 MED ORDER — ONDANSETRON HCL 4 MG/2ML IJ SOLN
INTRAMUSCULAR | Status: DC | PRN
  Administered 2019-02-25: 4 mg via INTRAVENOUS

## 2019-02-25 MED ORDER — PROPOFOL 10 MG/ML IV BOLUS
INTRAVENOUS | Status: AC
  Filled 2019-02-25: qty 20

## 2019-02-25 MED ORDER — LACTATED RINGERS IR SOLN
Status: DC | PRN
  Administered 2019-02-25: 1000 mL

## 2019-02-25 MED ORDER — FENTANYL CITRATE (PF) 100 MCG/2ML IJ SOLN
INTRAMUSCULAR | Status: AC
  Filled 2019-02-25: qty 4

## 2019-02-25 MED ORDER — PROPOFOL 10 MG/ML IV BOLUS
INTRAVENOUS | Status: DC | PRN
  Administered 2019-02-25: 130 mg via INTRAVENOUS

## 2019-02-25 MED ORDER — PHENYLEPHRINE 40 MCG/ML (10ML) SYRINGE FOR IV PUSH (FOR BLOOD PRESSURE SUPPORT)
PREFILLED_SYRINGE | INTRAVENOUS | Status: DC | PRN
  Administered 2019-02-25: 120 ug via INTRAVENOUS

## 2019-02-25 MED ORDER — ROCURONIUM BROMIDE 10 MG/ML (PF) SYRINGE
PREFILLED_SYRINGE | INTRAVENOUS | Status: DC | PRN
  Administered 2019-02-25: 40 mg via INTRAVENOUS
  Administered 2019-02-25: 10 mg via INTRAVENOUS

## 2019-02-25 MED ORDER — ACETAMINOPHEN 10 MG/ML IV SOLN: 1000.0000 mg | Freq: Once | INTRAVENOUS | Status: DC | PRN

## 2019-02-25 MED ORDER — FENTANYL CITRATE (PF) 100 MCG/2ML IJ SOLN
INTRAMUSCULAR | Status: AC
  Filled 2019-02-25: qty 2

## 2019-02-25 MED ORDER — SUGAMMADEX SODIUM 200 MG/2ML IV SOLN
INTRAVENOUS | Status: DC | PRN
  Administered 2019-02-25: 200 mg via INTRAVENOUS

## 2019-02-25 MED ORDER — SUCCINYLCHOLINE CHLORIDE 20 MG/ML IJ SOLN
INTRAMUSCULAR | Status: DC | PRN
  Administered 2019-02-25: 120 mg via INTRAVENOUS

## 2019-02-25 MED ORDER — ACETAMINOPHEN 500 MG PO TABS
1000.0000 mg | ORAL_TABLET | Freq: Three times a day (TID) | ORAL | Status: DC
  Administered 2019-02-25 – 2019-02-26 (×3): 1000 mg via ORAL
  Filled 2019-02-25 (×3): qty 2

## 2019-02-25 MED ORDER — LIDOCAINE 2% (20 MG/ML) 5 ML SYRINGE
INTRAMUSCULAR | Status: DC | PRN
  Administered 2019-02-25: 60 mg via INTRAVENOUS

## 2019-02-25 MED ORDER — DEXAMETHASONE SODIUM PHOSPHATE 10 MG/ML IJ SOLN
INTRAMUSCULAR | Status: DC | PRN
  Administered 2019-02-25: 5 mg via INTRAVENOUS

## 2019-02-25 MED ORDER — LACTATED RINGERS IV SOLN: INTRAVENOUS | Status: DC

## 2019-02-25 MED ORDER — HYDROCODONE-ACETAMINOPHEN 5-325 MG PO TABS
ORAL_TABLET | ORAL | Status: AC
  Filled 2019-02-25: qty 1

## 2019-02-25 SURGICAL SUPPLY — 42 items
ADH SKN CLS APL DERMABOND .7 (GAUZE/BANDAGES/DRESSINGS) ×1
APL PRP STRL LF DISP 70% ISPRP (MISCELLANEOUS) ×1
APPLIER CLIP ROT 10 11.4 M/L (STAPLE) ×2
APR CLP MED LRG 11.4X10 (STAPLE) ×1
BAG SPEC RTRVL LRG 6X4 10 (ENDOMECHANICALS) ×1
CABLE HIGH FREQUENCY MONO STRZ (ELECTRODE) ×2 IMPLANT
CHLORAPREP W/TINT 26 (MISCELLANEOUS) ×2 IMPLANT
CLIP APPLIE ROT 10 11.4 M/L (STAPLE) ×1 IMPLANT
COVER MAYO STAND STRL (DRAPES) IMPLANT
COVER SURGICAL LIGHT HANDLE (MISCELLANEOUS) ×2 IMPLANT
COVER WAND RF STERILE (DRAPES) IMPLANT
CUTTER FLEX LINEAR 45M (STAPLE) ×1 IMPLANT
DECANTER SPIKE VIAL GLASS SM (MISCELLANEOUS) ×2 IMPLANT
DERMABOND ADVANCED (GAUZE/BANDAGES/DRESSINGS) ×1
DERMABOND ADVANCED .7 DNX12 (GAUZE/BANDAGES/DRESSINGS) ×1 IMPLANT
DRAPE C-ARM 42X120 X-RAY (DRAPES) IMPLANT
ELECT REM PT RETURN 15FT ADLT (MISCELLANEOUS) ×2 IMPLANT
GLOVE BIO SURGEON STRL SZ 6 (GLOVE) ×1 IMPLANT
GLOVE INDICATOR 6.5 STRL GRN (GLOVE) ×3 IMPLANT
GOWN STRL REUS W/TWL LRG LVL3 (GOWN DISPOSABLE) ×2 IMPLANT
GOWN STRL REUS W/TWL XL LVL3 (GOWN DISPOSABLE) ×4 IMPLANT
GRASPER SUT TROCAR 14GX15 (MISCELLANEOUS) IMPLANT
HEMOSTAT SNOW SURGICEL 2X4 (HEMOSTASIS) ×1 IMPLANT
KIT BASIN OR (CUSTOM PROCEDURE TRAY) ×2 IMPLANT
KIT TURNOVER KIT A (KITS) ×1 IMPLANT
NDL INSUFFLATION 14GA 120MM (NEEDLE) ×1 IMPLANT
NEEDLE INSUFFLATION 14GA 120MM (NEEDLE) ×2 IMPLANT
POUCH SPECIMEN RETRIEVAL 10MM (ENDOMECHANICALS) ×2 IMPLANT
RELOAD STAPLE 45 3.5 BLU ETS (ENDOMECHANICALS) IMPLANT
RELOAD STAPLE TA45 3.5 REG BLU (ENDOMECHANICALS) ×2 IMPLANT
SCISSORS LAP 5X35 DISP (ENDOMECHANICALS) ×2 IMPLANT
SET CHOLANGIOGRAPH MIX (MISCELLANEOUS) IMPLANT
SET IRRIG TUBING LAPAROSCOPIC (IRRIGATION / IRRIGATOR) ×2 IMPLANT
SET TUBE SMOKE EVAC HIGH FLOW (TUBING) ×2 IMPLANT
SLEEVE XCEL OPT CAN 5 100 (ENDOMECHANICALS) ×4 IMPLANT
SUT ETHILON 2 0 PS N (SUTURE) ×1 IMPLANT
SUT MNCRL AB 4-0 PS2 18 (SUTURE) ×2 IMPLANT
TOWEL OR 17X26 10 PK STRL BLUE (TOWEL DISPOSABLE) ×2 IMPLANT
TOWEL OR NON WOVEN STRL DISP B (DISPOSABLE) IMPLANT
TRAY LAPAROSCOPIC (CUSTOM PROCEDURE TRAY) ×2 IMPLANT
TROCAR BLADELESS OPT 5 100 (ENDOMECHANICALS) ×2 IMPLANT
TROCAR XCEL 12X100 BLDLESS (ENDOMECHANICALS) ×2 IMPLANT

## 2019-02-25 NOTE — Op Note (Signed)
Operative Note  [name] Young 68 y.o. female [id number]  02/25/2019  Surgeon: Berna Bue MD FACS  Assistant: Will Marlyne Beards, PA-C  Procedure performed: Laparoscopic Subtotal (stapled) Cholecystectomy  Procedure classification: URGENT  Preop diagnosis: cholecystitis Post-op diagnosis/intraop findings: severe acute on chronic cholecystitis  Specimens: gallbladder  Retained items: 12fr round blake drain  EBL: 50cc  Complications: none  Description of procedure: After obtaining informed consent the patient was brought to the operating room. Antibiotics were administered. SCD's were applied. General endotracheal anesthesia was initiated and a formal time-out was performed. The abdomen was prepped and draped in the usual sterile fashion and the abdomen was entered using an infraumbilical veress needle after instilling the site with local. Insufflation to was obtained, 25mm trocar and camera inserted, and gross inspection revealed no evidence of injury from our entry or other intraabdominal abnormalities. Two 17mm trocars were introduced in the right midclavicular and right anterior axillary lines under direct visualization and following infiltration with local. An 44mm trocar was placed in the epigastrium.   The gallbladder was tensely distended and severely acutely inflamed, encased in very thickened, woody fibrotic omental adhesions and the duodenum and colon were drawn up towards the gallbladder.  The Nezhat aspirator was used to decompress the gallbladder so that it could be grasped and retracted cephalad.  Combination of careful blunt dissection and cautery where safe were used to dissect through these until we were able to get down to the infundibulum.  The gallbladder wall itself was quite thickened and very difficult to grab and retract.    The infundibulum was retracted laterally and we continued dissecting through the very woody fibrotic peritoneal tissue overlying the  gallbladder in a meticulous layer by layer dissection using the hook cautery and blunt suction dissection on the anterior and posterior surface of the gallbladder.  As we approach the distal aspect of the gallbladder, the common bile duct is visualized and it is tethered with inflammatory tissue towards the gallbladder.  Overlying the neck of the gallbladder, between it and the visualized common duct, there is a bulbous, firm structure which is either an inflamed lymph node or a corkscrewed cystic duct.  In either case, it was not possible to really dissect this out and clearly delineate the cystic duct as the common duct was pulled toward this structure.  At this juncture, I was able to come behind the neck of the gallbladder using gentle blunt dissection, creating a plane that I was able develop distally until the entire gallbladder had been dissected off of the liver bed and was connected to the patient only by the neck of the gallbladder as it dives behind this inflamed structure.  During this process I did encounter a small accessory duct in the mid liver bed which was ligated with a clip prior to dividing it sharply. A blue load 45 mm GIA stapler was then used to transect the neck of the gallbladder.    The gallbladder was then removed our epigastric trocar site within an Endo Catch bag.  The liver bed was inspected for hemostasis which was confirmed with the use of cautery.  The staple line appears to be intact, at the posterior aspect, there is a little bit of crumbling stone material caught in the staple line but it appears to be viable, and there is no leakage of bile or other fluid.  A piece of Surgicel snow was placed in the liver bed.  A 19 [place] round Blake drain was then  inserted to sit in the liver bed, exiting the right lateral port site.  This is secured to the skin with a 2-0 nylon.  The extraction site in the epigastrium was closed with 3 interrupted 0 Vicryls using the laparoscopic suture  passer under direct visualization.  The abdomen was then inspected and hemostasis confirmed, along with no evidence of injury or other abnormality.    The abdomen was then desufflated and all trochars removed.  The skin incisions were closed with subcuticular Monocryl and Dermabond.  The drain will be placed to bulb suction.     All counts were correct at the completion of the case.  The patient's husband, [name] SextonRalph was updated by phone as to the operative findings at the end of the case.   Photo 1- gallbladder is freed off the liver bed and connected only by the gallbladder neck.  Photo 2 (above): gallbladder neck is in the center, inflamed bulbous structure is seen, the tip of the suction aspirator is just to the screen right of the common bile duct  Photo 3 (below): suction indicates approximate trajectory of stapler  Photo 4 (above): staple line

## 2019-02-25 NOTE — Anesthesia Preprocedure Evaluation (Addendum)
Anesthesia Evaluation  Patient identified by MRN, date of birth, ID band Patient awake    Reviewed: Allergy & Precautions, NPO status , Patient's Chart, lab work & pertinent test results  Airway Mallampati: I  TM Distance: >3 FB Neck ROM: Full    Dental  (+) Teeth Intact, Dental Advisory Given   Pulmonary asthma ,    breath sounds clear to auscultation       Cardiovascular negative cardio ROS   Rhythm:Regular Rate:Normal     Neuro/Psych negative neurological ROS     GI/Hepatic negative GI ROS, Neg liver ROS,   Endo/Other  negative endocrine ROS  Renal/GU negative Renal ROS     Musculoskeletal negative musculoskeletal ROS (+)   Abdominal Normal abdominal exam  (+)   Peds  Hematology   Anesthesia Other Findings - HLD  Reproductive/Obstetrics                            Lab Results  Component Value Date   WBC 13.2 (H) 02/24/2019   HGB 13.9 02/24/2019   HCT 42.4 02/24/2019   MCV 91.4 02/24/2019   PLT 260 02/24/2019   Lab Results  Component Value Date   CREATININE 0.78 02/24/2019   BUN 14 02/24/2019   NA 141 02/24/2019   K 3.6 02/24/2019   CL 108 02/24/2019   CO2 23 02/24/2019   No results found for: INR, PROTIME  COVID-19 Labs  No results for input(s): DDIMER, FERRITIN, LDH, CRP in the last 72 hours.  Lab Results  Component Value Date   SARSCOV2NAA NEGATIVE 02/23/2019     Anesthesia Physical Anesthesia Plan  ASA: II  Anesthesia Plan: General   Post-op Pain Management:    Induction: Intravenous  PONV Risk Score and Plan: 4 or greater and Ondansetron, Dexamethasone, Midazolam and Scopolamine patch - Pre-op  Airway Management Planned: Oral ETT  Additional Equipment: None  Intra-op Plan:   Post-operative Plan: Extubation in OR  Informed Consent: I have reviewed the patients History and Physical, chart, labs and discussed the procedure including the risks,  benefits and alternatives for the proposed anesthesia with the patient or authorized representative who has indicated his/her understanding and acceptance.     Dental advisory given  Plan Discussed with: CRNA  Anesthesia Plan Comments:        Anesthesia Quick Evaluation

## 2019-02-25 NOTE — Anesthesia Procedure Notes (Signed)
Procedure Name: Intubation Performed by: Gean Maidens, CRNA Pre-anesthesia Checklist: Patient identified, Emergency Drugs available, Suction available, Patient being monitored and Timeout performed Patient Re-evaluated:Patient Re-evaluated prior to induction Oxygen Delivery Method: Circle system utilized Preoxygenation: Pre-oxygenation with 100% oxygen Induction Type: IV induction and Rapid sequence Laryngoscope Size: Mac and 3 Grade View: Grade I Tube type: Oral Tube size: 7.0 mm Number of attempts: 1 Airway Equipment and Method: Stylet Placement Confirmation: ETT inserted through vocal cords under direct vision,  positive ETCO2 and breath sounds checked- equal and bilateral Secured at: 21 cm Tube secured with: Tape Dental Injury: Teeth and Oropharynx as per pre-operative assessment

## 2019-02-25 NOTE — Transfer of Care (Signed)
Immediate Anesthesia Transfer of Care Note  Patient: [name] Young  Procedure(s) Performed: LAPAROSCOPIC CHOLECYSTECTOMY WITH INTRAOPERATIVE CHOLANGIOGRAM (N/A )  Patient Location: PACU  Anesthesia Type:General  Level of Consciousness: awake, alert  and oriented  Airway & Oxygen Therapy: Patient Spontanous Breathing and Patient connected to face mask oxygen  Post-op Assessment: Report given to RN and Post -op Vital signs reviewed and stable  Post vital signs: Reviewed and stable  Last Vitals:  Vitals Value Taken Time  BP    Temp    Pulse 73 02/25/2019  2:12 PM  Resp 22 02/25/2019  2:12 PM  SpO2 98 % 02/25/2019  2:12 PM  Vitals shown include unvalidated device data.  Last Pain:  Vitals:   02/25/19 1117  TempSrc:   PainSc: 0-No pain      Patients Stated Pain Goal: 3 (02/25/19 1117)  Complications: No apparent anesthesia complications

## 2019-02-25 NOTE — Progress Notes (Signed)
Day of Surgery   CC: abdominal pain  Subjective: Pain is a little better this morning with medication on board.    Objective: Vital signs in last 24 hours: Temp:  [98.4 F (36.9 C)-100.3 F (37.9 C)] 98.8 F (37.1 C) (05/14 0555) Pulse Rate:  [71-83] 71 (05/14 0555) Resp:  [16] 16 (05/14 0555) BP: (110-147)/(55-71) 110/55 (05/14 0555) SpO2:  [91 %-94 %] 92 % (05/14 0555) Last BM Date: 02/23/19  Intake/Output from previous day: 05/13 0701 - 05/14 0700 In: 2784.9 [P.O.:120; I.V.:2309.9; IV Piggyback:355.1] Out: 202 [Urine:202] Intake/Output this shift: No intake/output data recorded.  General appearance: alert, cooperative and no distress Resp: clear to auscultation bilaterally GI: Tender RUQ  Lab Results:  Recent Labs    02/23/19 1644 02/24/19 0528  WBC 13.2* 13.2*  HGB 15.8* 13.9  HCT 47.0* 42.4  PLT 299 260    BMET Recent Labs    02/23/19 1644 02/24/19 0528  NA 140 141  K 3.7 3.6  CL 103 108  CO2 26 23  GLUCOSE 148* 142*  BUN 19 14  CREATININE 0.81 0.78  CALCIUM 10.0 9.0   PT/INR No results for input(s): LABPROT, INR in the last 72 hours.  Recent Labs  Lab 02/23/19 1644 02/24/19 0528  AST 29 21  ALT 28 24  ALKPHOS 79 64  BILITOT 1.6* 1.0  PROT 8.4* 6.8  ALBUMIN 4.9 3.8     Lipase     Component Value Date/Time   LIPASE 24 02/23/2019 1644   Prior to Admission medications   Medication Sig Start Date End Date Taking? Authorizing Provider  celecoxib (CELEBREX) 100 MG capsule TAKE 1 CAPSULE (100 MG TOTAL) BY MOUTH 2 (TWO) TIMES DAILY. OFFICE VISIT NEEDED 11/17/17  Yes Shade Flood, MD  metroNIDAZOLE (METROGEL) 0.75 % gel Apply 1 application topically 2 (two) times daily.    [provider]    . cefTRIAXone (ROCEPHIN)  IV 2 g (02/25/19 0121)  . dextrose 5 % and 0.45 % NaCl with KCl 20 mEq/L 100 mL/hr at 02/24/19 2152  . metronidazole 500 mg (02/25/19 0526)     Medications:   Assessment/Plan Arthritis both  hands Asthma Hyperlipidemia  Cholecystitis/cholelithiasis  FEN: N.p.o./IV fluids ID: Rocephin / flagyl 5/12 >> day  DVT: SCDs-Lovenox on hold for surgery Follow-up: [facility]   Plan: OR today for laparoscopic cholecystectomy. I have discussed surgery and risks as previously documented. Questions again welcomed and answered and she is eager to proceed.    LOS: 1 day    Berna Bue 02/25/2019

## 2019-02-25 NOTE — Anesthesia Postprocedure Evaluation (Signed)
Anesthesia Post Note  Patient: [name] Young  Procedure(s) Performed: LAPAROSCOPIC CHOLECYSTECTOMY (N/A )     Patient location during evaluation: PACU Anesthesia Type: General Level of consciousness: awake and alert Pain management: pain level controlled Vital Signs Assessment: post-procedure vital signs reviewed and stable Respiratory status: spontaneous breathing, nonlabored ventilation, respiratory function stable and patient connected to nasal cannula oxygen Cardiovascular status: blood pressure returned to baseline and stable Postop Assessment: no apparent nausea or vomiting Anesthetic complications: no    Last Vitals:  Vitals:   02/25/19 1545 02/25/19 1600  BP: 130/68 137/73  Pulse: 64 64  Resp: 13 13  Temp:    SpO2: 97% 97%    Last Pain:  Vitals:   02/25/19 1530  TempSrc:   PainSc: Asleep                 [name] P [name]

## 2019-02-26 ENCOUNTER — Encounter (HOSPITAL_COMMUNITY): Payer: Self-pay | Admitting: Surgery

## 2019-02-26 LAB — COMPREHENSIVE METABOLIC PANEL
ALT: 35 U/L (ref 0–44)
AST: 30 U/L (ref 15–41)
Albumin: 2.8 g/dL — ABNORMAL LOW (ref 3.5–5.0)
Alkaline Phosphatase: 61 U/L (ref 38–126)
Anion gap: 7 (ref 5–15)
BUN: 10 mg/dL (ref 8–23)
CO2: 25 mmol/L (ref 22–32)
Calcium: 8.4 mg/dL — ABNORMAL LOW (ref 8.9–10.3)
Chloride: 108 mmol/L (ref 98–111)
Creatinine, Ser: 0.72 mg/dL (ref 0.44–1.00)
GFR calc Af Amer: >60 mL/min (ref >60)
GFR calc non Af Amer: >60 mL/min (ref >60)
Glucose, Bld: 138 mg/dL — ABNORMAL HIGH (ref 70–99)
Potassium: 3.7 mmol/L (ref 3.5–5.1)
Sodium: 140 mmol/L (ref 135–145)
Total Bilirubin: 0.7 mg/dL (ref 0.3–1.2)
Total Protein: 5.6 g/dL — ABNORMAL LOW (ref 6.5–8.1)

## 2019-02-26 MED ORDER — CIPROFLOXACIN HCL 500 MG PO TABS
500.0000 mg | ORAL_TABLET | Freq: Two times a day (BID) | ORAL | Status: DC
  Administered 2019-02-26: 13:00:00 500 mg via ORAL
  Filled 2019-02-26: qty 1

## 2019-02-26 MED ORDER — CIPROFLOXACIN HCL 500 MG PO TABS: 500.0000 mg | ORAL_TABLET | Freq: Two times a day (BID) | ORAL | 0 refills | Status: AC

## 2019-02-26 MED ORDER — METRONIDAZOLE 500 MG PO TABS: 500.0000 mg | ORAL_TABLET | Freq: Three times a day (TID) | ORAL | 0 refills | Status: AC

## 2019-02-26 MED ORDER — IBUPROFEN 200 MG PO TABS: 600.0000 mg | ORAL_TABLET | Freq: Four times a day (QID) | ORAL | Status: DC | PRN

## 2019-02-26 MED ORDER — METRONIDAZOLE 500 MG PO TABS
500.0000 mg | ORAL_TABLET | Freq: Three times a day (TID) | ORAL | Status: DC
  Administered 2019-02-26: 500 mg via ORAL
  Filled 2019-02-26: qty 1

## 2019-02-26 MED ORDER — ACETAMINOPHEN 500 MG PO TABS: 1000.0000 mg | ORAL_TABLET | Freq: Three times a day (TID) | ORAL | Status: AC | PRN

## 2019-02-26 MED ORDER — ENOXAPARIN SODIUM 40 MG/0.4ML ~~LOC~~ SOLN
40.0000 mg | SUBCUTANEOUS | Status: DC
  Filled 2019-02-26: qty 0.4

## 2019-02-26 MED ORDER — OXYCODONE HCL 5 MG PO TABS: 5.0000 mg | ORAL_TABLET | ORAL | 0 refills | Status: DC | PRN

## 2019-02-26 NOTE — Progress Notes (Signed)
Pt alert and oriented. Was shown how to charge and empty her JP drain.  D/C instructions were given, all questions answered. PT was d/cd home.

## 2019-02-26 NOTE — Care Management Important Message (Signed)
Important Message  Patient Details IM Letter given to [name] SW to present to the Patient Name: [name] Young MRN: [id number] Date of Birth: 01/19/1951   Medicare Important Message Given:  Yes    Caren Macadam 02/26/2019, 10:31 AM

## 2019-02-26 NOTE — Discharge Instructions (Signed)
[facility] ______[facility] LAPAROSCOPIC SURGERY: POST OP INSTRUCTIONS Always review your discharge instruction sheet given to you by the facility where your surgery was performed. IF YOU HAVE DISABILITY OR FAMILY LEAVE FORMS, YOU MUST BRING THEM TO THE OFFICE FOR PROCESSING.   DO NOT GIVE THEM TO YOUR DOCTOR.  1. A prescription for pain medication may be given to you upon discharge.  Take your pain medication as prescribed, if needed.  If narcotic pain medicine is not needed, then you may take acetaminophen (Tylenol) or ibuprofen (Advil) as needed. 2. Take your usually prescribed medications unless otherwise directed. 3. If you need a refill on your pain medication, please contact your pharmacy.  They will contact our office to request authorization. Prescriptions will not be filled after 5pm or on week-ends. 4. You should follow a light diet the first few days after arrival home, such as soup and crackers, etc.  Be sure to include lots of fluids daily. 5. Most patients will experience some swelling and bruising in the area of the incisions.  Ice packs will help.  Swelling and bruising can take several days to resolve.  6. It is common to experience some constipation if taking pain medication after surgery.  Increasing fluid intake and taking a stool softener (such as Colace) will usually help or prevent this problem from occurring.  A mild laxative (Milk of Magnesia or Miralax) should be taken according to package instructions if there are no bowel movements after 48 hours. 7. Unless discharge instructions indicate otherwise, you may remove your bandages 24-48 hours after surgery, and you may shower at that time.  You may have steri-strips (small skin tapes) in place directly over the incision.  These strips should be left on the skin for 7-10 days.  If your surgeon used skin glue on the incision, you may shower in 24 hours.  The glue will flake off over the next 2-3 weeks.  Any sutures or  staples will be removed at the office during your follow-up visit. 8. ACTIVITIES:  You may resume regular (light) daily activities beginning the next day--such as daily self-care, walking, climbing stairs--gradually increasing activities as tolerated.  You may have sexual intercourse when it is comfortable.  Refrain from any heavy lifting or straining until approved by your doctor. a. You may drive when you are no longer taking prescription pain medication, you can comfortably wear a seatbelt, and you can safely maneuver your car and apply brakes.  9. You should see your doctor in the office for a follow-up appointment approximately 2-3 weeks after your surgery.  Make sure that you call for this appointment within a day or two after you arrive home to insure a convenient appointment time.  WHEN TO CALL YOUR DOCTOR: 1. Fever over 101.0 2. Inability to urinate 3. Continued bleeding from incision. 4. Increased pain, redness, or drainage from the incision. 5. Increasing abdominal pain  The clinic staff is available to answer your questions during regular business hours.  Please dont hesitate to call and ask to speak to one of the nurses for clinical concerns.  If you have a medical emergency, go to the nearest emergency room or call 911.  A surgeon from [facility] is always on call at the hospital. [address], [place], [place]  [address] ? [address] [address], [place], [place]   [address] [phone] ? [phone] ? FAX [phone] Web site: [email or web address]     Managing Your Pain After  Surgery Without Opioids    Thank you for participating in our program to help patients manage their pain after surgery without opioids. This is part of our effort to provide you with the best care possible, without exposing you or your family to the risk that opioids pose.  What pain can I expect after surgery? You can expect to have some pain after surgery. This  is normal. The pain is typically worse the day after surgery, and quickly begins to get better. Many studies have found that many patients are able to manage their pain after surgery with Over-the-Counter (OTC) medications such as Tylenol and Motrin. If you have a condition that does not allow you to take Tylenol or Motrin, notify your surgical team.  How will I manage my pain? The best strategy for controlling your pain after surgery is around the clock pain control with Tylenol (acetaminophen) and Motrin (ibuprofen or Advil). Alternating these medications with each other allows you to maximize your pain control. In addition to Tylenol and Motrin, you can use heating pads or ice packs on your incisions to help reduce your pain.  How will I alternate your regular strength over-the-counter pain medication? You will take a dose of pain medication every three hours. ; Start by taking 650 mg of Tylenol (2 pills of 325 mg) ; 3 hours later take 600 mg of Motrin (3 pills of 200 mg) ; 3 hours after taking the Motrin take 650 mg of Tylenol ; 3 hours after that take 600 mg of Motrin.   - 1 -  See example - if your first dose of Tylenol is at 12:00 PM   12:00 PM Tylenol 650 mg (2 pills of 325 mg)  3:00 PM Motrin 600 mg (3 pills of 200 mg)  6:00 PM Tylenol 650 mg (2 pills of 325 mg)  9:00 PM Motrin 600 mg (3 pills of 200 mg)  Continue alternating every 3 hours   We recommend that you follow this schedule around-the-clock for at least 3 days after surgery, or until you feel that it is no longer needed. Use the table on the last page of this handout to keep track of the medications you are taking. Important: Do not take more than  of Tylenol or  of Motrin in a 24-hour period. Do not take ibuprofen/Motrin if you have a history of bleeding stomach ulcers, severe kidney disease, &/or actively taking a blood thinner  What if I still have pain? If you have pain that is not controlled with the  over-the-counter pain medications (Tylenol and Motrin or Advil) you might have what we call breakthrough pain. You will receive a prescription for a small amount of an opioid pain medication such as Oxycodone, Tramadol, or Tylenol with Codeine. Use these opioid pills in the first 24 hours after surgery if you have breakthrough pain. Do not take more than 1 pill every 4-6 hours.  If you still have uncontrolled pain after using all opioid pills, don't hesitate to call our staff using the number provided. We will help make sure you are managing your pain in the best way possible, and if necessary, we can provide a prescription for additional pain medication.   Day 1    Time  Name of Medication Number of pills taken  Amount of Acetaminophen  Pain Level   Comments  AM PM       AM PM       AM PM  AM PM       AM PM       AM PM       AM PM       AM PM       Total Daily amount of Acetaminophen Do not take more than  3,000 mg per day      Day 2    Time  Name of Medication Number of pills taken  Amount of Acetaminophen  Pain Level   Comments  AM PM       AM PM       AM PM       AM PM       AM PM       AM PM       AM PM       AM PM       Total Daily amount of Acetaminophen Do not take more than  3,000 mg per day      Day 3    Time  Name of Medication Number of pills taken  Amount of Acetaminophen  Pain Level   Comments  AM PM       AM PM       AM PM       AM PM          AM PM       AM PM       AM PM       AM PM       Total Daily amount of Acetaminophen Do not take more than  3,000 mg per day      Day 4    Time  Name of Medication Number of pills taken  Amount of Acetaminophen  Pain Level   Comments  AM PM       AM PM       AM PM       AM PM       AM PM       AM PM       AM PM       AM PM       Total Daily amount of Acetaminophen Do not take more than  3,000 mg per day      Day 5    Time  Name of Medication Number of pills taken    Amount of Acetaminophen  Pain Level   Comments  AM PM       AM PM       AM PM       AM PM       AM PM       AM PM       AM PM       AM PM       Total Daily amount of Acetaminophen Do not take more than  3,000 mg per day       Day 6    Time  Name of Medication Number of pills taken  Amount of Acetaminophen  Pain Level  Comments  AM PM       AM PM       AM PM       AM PM       AM PM       AM PM       AM PM       AM PM       Total Daily amount of Acetaminophen Do not take more than  3,000 mg per day      Day 7    Time  Name of Medication Number of pills taken  Amount of Acetaminophen  Pain Level   Comments  AM PM       AM PM       AM PM       AM PM       AM PM       AM PM       AM PM       AM PM       Total Daily amount of Acetaminophen Do not take more than  3,000 mg per day        For additional information about how and where to safely dispose of unused opioid medications - [employer]uyhttps://www.morepowerfulnc.org  Disclaimer: This document contains information and/or instructional materials adapted from [facility] for the typical patient with your condition. It does not replace medical advice from your health care provider because your experience may differ from that of the typical patient. Talk to your health care provider if you have any questions about this document, your condition or your treatment plan. Adapted from [facility]   [facility] Surgical drains are used to remove extra fluid that normally builds up in a surgical wound after surgery. A surgical drain helps to heal a surgical wound. Different kinds of surgical drains include:  Active drains. These drains use suction to pull drainage away from the surgical wound. Drainage flows through a tube to a container outside of the body. It is important to keep the bulb or the drainage container flat (compressed) at all times, except while you empty it. Flattening the bulb  or container creates suction. The two most common types of active drains are bulb drains and Hemovac drains.  Passive drains. These drains allow fluid to drain naturally, by gravity. Drainage flows through a tube to a bandage (dressing) or a container outside of the body. Passive drains do not need to be emptied. The most common type of passive drain is the Penrose drain. A drain is placed during surgery. Immediately after surgery, drainage is usually bright red and a little thicker than water. The drainage may gradually turn yellow or pink and become thinner. It is likely that your health care provider will remove the drain when the drainage stops or when the amount decreases to 1-2 Tbsp (15-30 mL) during a 24-hour period. How to care for your surgical drain It is important to care for your drain to prevent infection. If your drain is placed at your back, or any other hard-to-reach area, ask another person to assist you in performing the following steps:  Keep the skin around the drain dry and covered with a dressing at all times.  Check your drain area every day for signs of infection. Check for: ? More redness, swelling, or pain. ? Pus or a bad smell. ? Cloudy drainage. Follow instructions from your health care provider about how to take care of your drain and how to change your dressing. Change your dressing at least one time every day. Change it more often if needed to keep the dressing dry. Make sure you: 1. Gather your supplies, including: ? Tape. ? Germ-free cleaning solution (sterile saline). ? Split gauze drain sponge: 4 x 4 inches (10 x 10 cm). ? Gauze square: 4 x 4 inches (10 x 10 cm). 2. Wash your hands with soap and water before you change your dressing. If  soap and water are not available, use hand sanitizer. 3. Remove the old dressing. Avoid using scissors to do that. 4. Use sterile saline to clean your skin around the drain. 5. Place the tube through the slit in a drain sponge.  Place the drain sponge so that it covers your wound. 6. Place the gauze square or another drain sponge on top of the drain sponge that is on the wound. Make sure the tube is between those layers. 7. Tape the dressing to your skin. 8. If you have an active bulb or Hemovac drain, tape the drainage tube to your skin 1-2 inches (2.5-5 cm) below the place where the tube enters your body. Taping keeps the tube from pulling on any stitches (sutures) that you have. 9. Wash your hands with soap and water. 10. Write down the color of your drainage and how often you change your dressing. How to empty your active bulb or Hemovac drain  1. Make sure that you have a measuring cup that you can empty your drainage into. 2. Wash your hands with soap and water. If soap and water are not available, use hand sanitizer. 3. Gently move your fingers down the tube while squeezing very lightly. This is called stripping the tube. This clears any drainage, clots, or tissue from the tube. ? Do not pull on the tube. ? You may need to strip the tube several times every day to keep the tube clear. 4. Open the bulb cap or the drain plug. Do not touch the inside of the cap or the bottom of the plug. 5. Empty all of the drainage into the measuring cup. 6. Compress the bulb or the container and replace the cap or the plug. To compress the bulb or the container, squeeze it firmly in the middle while you close the cap or plug the container. 7. Write down the amount of drainage that you have in each 24-hour period. If you have less than 2 Tbsp (30 mL) of drainage during 24 hours, contact your health care provider. 8. Flush the drainage down the toilet. 9. Wash your hands with soap and water. Contact a health care provider if:  You have more redness, swelling, or pain around your drain area.  The amount of drainage that you have is increasing instead of decreasing.  You have pus or a bad smell coming from your drain area.  You  have a fever.  You have drainage that is cloudy.  There is a sudden stop or a sudden decrease in the amount of drainage that you have.  Your tube falls out.  Your active draindoes not stay compressedafter you empty it. Summary  Surgical drains are used to remove extra fluid that normally builds up in a surgical wound after surgery.  Different kinds of surgical drains include active drains and passive drains. Active drains use suction to pull drainage away from the surgical wound, and passive drains allow fluid to drain naturally.  It is important to care for your drain to prevent infection. If your drain is placed at your back, or any other hard-to-reach area, ask another person to assist you.  Contact your health care provider if you have more redness, swelling, or pain around your drain area. This information is not intended to replace advice given to you by your health care provider. Make sure you discuss any questions you have with your health care provider. Document Released: 09/27/2000 Document Revised: 10/23/2017 Document Reviewed: 04/19/2015 Elsevier Interactive Patient Education  2019 Elsevier Inc.  Surgical Drain Record Empty your surgical drain as told by your health care provider. Use this form to write down the amount of fluid that has collected in the drainage container. Bring this form with you to your follow-up visits. Surgical drain #1 location: ___________________  Date __________ Time __________ Amount __________ Date __________ Time __________ Amount __________ Date __________ Time __________ Amount __________ Date __________ Time __________ Amount __________ Date __________ Time __________ Amount __________ Date __________ Time __________ Amount __________ Date __________ Time __________ Amount __________ Date __________ Time __________ Amount __________ Date __________ Time __________ Amount __________ Date __________ Time __________ Amount __________ Date  __________ Time __________ Amount __________ Date __________ Time __________ Amount __________ Date __________ Time __________ Amount __________ Date __________ Time __________ Amount __________ Date __________ Time __________ Amount __________ Date __________ Time __________ Amount __________ Date __________ Time __________ Amount __________ Date __________ Time __________ Amount __________ Date __________ Time __________ Amount __________ Date __________ Time __________ Amount __________ Date __________ Time __________ Amount __________ Surgical drain #2 location: ___________________ Date __________ Time __________ Amount __________ Date __________ Time __________ Amount __________ Date __________ Time __________ Amount __________ Date __________ Time __________ Amount __________ Date __________ Time __________ Amount __________ Date __________ Time __________ Amount __________ Date __________ Time __________ Amount __________ Date __________ Time __________ Amount __________ Date __________ Time __________ Amount __________ Date __________ Time __________ Amount __________ Date __________ Time __________ Amount __________ Date __________ Time __________ Amount __________ Date __________ Time __________ Amount __________ Date __________ Time __________ Amount __________ Date __________ Time __________ Amount __________ Date __________ Time __________ Amount __________ Date __________ Time __________ Amount __________ Date __________ Time __________ Amount __________ Date __________ Time __________ Amount __________ Date __________ Time __________ Amount __________ Date __________ Time __________ Amount __________ This information is not intended to replace advice given to you by your health care provider. Make sure you discuss any questions you have with your health care provider. Document Released: 07/07/2017 Document Revised: 07/07/2017 Document Reviewed: 07/07/2017 Elsevier  Interactive Patient Education  2019 [employer].

## 2019-02-26 NOTE — Progress Notes (Signed)
1 Day Post-Op   CC: abdominal pain  Subjective: She is having some incisional pain as expected.  Feels somewhat better than before surgery.  Objective: Vital signs in last 24 hours: Temp:  [97.5 F (36.4 C)-98.6 F (37 C)] 97.7 F (36.5 C) (05/15 0546) Pulse Rate:  [50-73] 50 (05/15 0546) Resp:  [12-26] 18 (05/15 0546) BP: (111-153)/(57-90) 126/73 (05/15 0546) SpO2:  [90 %-100 %] 95 % (05/15 0546) Weight:  [77.1 kg] 77.1 kg (05/14 1117) Last BM Date: 02/23/19  Intake/Output from previous day: 05/14 0701 - 05/15 0700 In: 3084.8 [P.O.:240; I.V.:2500; IV Piggyback:344.8] Out: 1445 [Urine:1300; Drains:120; Blood:25] Intake/Output this shift: No intake/output data recorded.  General appearance: alert, cooperative and no distress Resp: clear to auscultation bilaterally GI: Abdomen is soft, appropriately tender.  Incisions are clean dry and intact without cellulitis.  Drain output is serosanguineous.  Lab Results:  Recent Labs    02/24/19 0528 02/26/19 0348  WBC 13.2* 9.5  HGB 13.9 11.1*  HCT 42.4 34.4*  PLT 260 206    BMET Recent Labs    02/24/19 0528 02/26/19 0348  NA 141 140  K 3.6 3.7  CL 108 108  CO2 23 25  GLUCOSE 142* 138*  BUN 14 10  CREATININE 0.78 0.72  CALCIUM 9.0 8.4*   PT/INR No results for input(s): LABPROT, INR in the last 72 hours.  Recent Labs  Lab 02/23/19 1644 02/24/19 0528 02/26/19 0348  AST 29 21 30   ALT 28 24 35  ALKPHOS 79 64 61  BILITOT 1.6* 1.0 0.7  PROT 8.4* 6.8 5.6*  ALBUMIN 4.9 3.8 2.8*     Lipase     Component Value Date/Time   LIPASE 24 02/23/2019 1644   Prior to Admission medications   Medication Sig Start Date End Date Taking? Authorizing Provider  celecoxib (CELEBREX) 100 MG capsule TAKE 1 CAPSULE (100 MG TOTAL) BY MOUTH 2 (TWO) TIMES DAILY. OFFICE VISIT NEEDED 11/17/17  Yes Shade Flood, MD  metroNIDAZOLE (METROGEL) 0.75 % gel Apply 1 application topically 2 (two) times daily.    [provider]     . cefTRIAXone (ROCEPHIN)  IV 2 g (02/26/19 0020)  . dextrose 5 % and 0.45 % NaCl with KCl 20 mEq/L 100 mL/hr at 02/26/19 0320  . metronidazole 500 mg (02/26/19 0139)     Medications: . acetaminophen  1,000 mg Oral Q8H    Assessment/Plan Arthritis both hands Asthma Hyperlipidemia  Cholecystitis/cholelithiasis  FEN: Clears, IV fluids ID: Rocephin / flagyl 5/12 >> day  DVT: SCDs, resume Lovenox Follow-up: [facility]   Plan: Advance diet as tolerated.  Mobilize, pulmonary toilet.  Possible discharge with the drain and 5 days of oral antibiotics later today depending on how she is feeling.   LOS: 2 days    Berna Bue 02/26/2019

## 2019-02-26 NOTE — Discharge Summary (Signed)
[facility] Discharge Summary   Patient ID: [name] Young MRN: [id number] DOB/AGE: 1951/07/19 69 y.o.  Admit date: 02/23/2019 Discharge date: 02/26/2019  Admitting Diagnosis: Acute cholecystitis  Discharge Diagnosis Patient Active Problem List   Diagnosis Date Noted  . Cholecystitis with cholelithiasis 02/24/2019  . Seasonal allergies 10/30/2012    Consultants None  Imaging: No results found.  Procedures Dr. Fredricka Bonine (02/25/19) - Laparoscopic Subtotal Cholecystectomy    Hospital Course:  Patient is a 68 year old female who presented to [facility] with abdominal pain, nausea and vomiting.  Workup showed acute cholecystitis.  Patient was admitted and underwent procedure listed above.  Tolerated procedure well and was transferred to the floor.  Diet was advanced as tolerated.  On POD#1, the patient was voiding well, tolerating diet, ambulating well, pain well controlled, vital signs stable, incisions c/d/i and felt stable for discharge home.  Patient will follow up in our office in 1 week and knows to call with questions or concerns.  She will call to confirm appointment date/time.    Physical Exam: General:  Alert, NAD, pleasant, comfortable Abd:  Soft, ND, mild tenderness, incisions C/D/I, drain with minimal sanguinous drainage   Allergies as of 02/26/2019      Reactions   Codeine Itching   Latex Other (See Comments)   unknown   Mobic [meloxicam]    Dizzy/ lightheaded      Medication List    STOP taking these medications   celecoxib 100 MG capsule Commonly known as:  CELEBREX     TAKE these medications   acetaminophen 500 MG tablet Commonly known as:  TYLENOL Take 2 tablets (1,000 mg total) by mouth every 8 (eight) hours as needed for mild pain.   ciprofloxacin 500 MG tablet Commonly known as:  CIPRO Take 1 tablet (500 mg total) by mouth 2 (two) times daily for 5 days.   ibuprofen 200 MG tablet Commonly known as:  ADVIL Take 3 tablets (600 mg total) by  mouth every 6 (six) hours as needed for moderate pain.   metroNIDAZOLE 0.75 % gel Commonly known as:  METROGEL Apply 1 application topically 2 (two) times daily.   metroNIDAZOLE 500 MG tablet Commonly known as:  FLAGYL Take 1 tablet (500 mg total) by mouth every 8 (eight) hours for 5 days.   oxyCODONE 5 MG immediate release tablet Commonly known as:  Oxy IR/ROXICODONE Take 1 tablet (5 mg total) by mouth every 4 (four) hours as needed for moderate pain, severe pain or breakthrough pain.        Follow-up Information    [facility] Follow up on 03/09/2019.   Specialty:  General Surgery Why:  Your follow up will be a phone call appointment  (due to the [facility] virus, to limit exposure.) Hedda Slade will call you at 11 AM.  Please email a picture if you have an concerns with you wound to : photos @ [email or web address]  Contact information: [address] [place] [address] [phone]        Renford Dills, MD Follow up.   Specialty:  Internal Medicine Why:  call for follow up of medical issues.  Let them know you had surgery Contact information: [address] [place] [address] [phone]        [facility], [place]. Go on 03/05/2019.   Specialty:  General Surgery Why:  Nurse visit for drain check and possible drain removal is scheduled for 10:00 AM. Please arrive 30  min prior to appointment time. Bring photo ID and insurance information.  Contact information: [address] [place] [address] [phone]          Signed: Wells Guiles, [facility] 02/26/2019, 3:29 PM Pager: [phone]

## 2019-02-28 LAB — CBC
HCT: 34.4 % — ABNORMAL LOW (ref 36.0–46.0)
Hemoglobin: 11.1 g/dL — ABNORMAL LOW (ref 12.0–15.0)
MCH: 29.8 pg (ref 26.0–34.0)
MCHC: 32.3 g/dL (ref 30.0–36.0)
MCV: 92.5 fL (ref 80.0–100.0)
Platelets: 206 10*3/uL (ref 150–400)
RBC: 3.72 MIL/uL — ABNORMAL LOW (ref 3.87–5.11)
RDW: 12.4 % (ref 11.5–15.5)
WBC: 9.5 10*3/uL (ref 4.0–10.5)
nRBC: 0 % (ref 0.0–0.2)

## 2019-03-01 ENCOUNTER — Ambulatory Visit: Payer: Self-pay

## 2019-03-01 NOTE — Telephone Encounter (Signed)
Pt is s/o cholecystectomy from last Thursday. Pt c/o vomiting green color. Pt stated that she feels fatigue and with dry mouth. Pt denies abdominal distension or increased abdominal pain.  Vomiting green emesis 7-8 times. Sounds slightly SOB.  Care advice given and call transferred to triage nurse at [facility]. Surgeons name is Dr Charlean Merl.  Reason for Disposition . [1] Vomiting AND [2] contains bile (green color)  Answer Assessment - Initial Assessment Questions 1. VOMITING SEVERITY: "How many times have you vomited in the past 24 hours?"     - MILD:  1 - 2 times/day    - MODERATE: 3 - 5 times/day, decreased oral intake without significant weight loss or symptoms of dehydration    - SEVERE: 6 or more times/day, vomits everything or nearly everything, with significant weight loss, symptoms of dehydration      severe 2. ONSET: "When did the vomiting begin?"     This am  3. FLUIDS: "What fluids or food have you vomited up today?" "Have you been able to keep any fluids down?"     Not eating  4. ABDOMINAL PAIN: "Are your having any abdominal pain?" If yes : "How bad is it and what does it feel like?" (e.g., crampy, dull, intermittent, constant)      Surgical pain 5. DIARRHEA: "Is there any diarrhea?" If so, ask: "How many times today?"      Flecks this morning 6. CONTACTS: "Is there anyone else in the family with the same symptoms?"      no 7. CAUSE: "What do you think is causing your vomiting?"     Pt doesn't know 8. HYDRATION STATUS: "Any signs of dehydration?" (e.g., dry mouth [not only dry lips], too weak to stand) "When did you last urinate?"     Dry tongue, fatigued- last urinated this morning decreased UOP 9. OTHER SYMPTOMS: "Do you have any other symptoms?" (e.g., fever, headache, vertigo, vomiting blood or coffee grounds, recent head injury)     Vomiting bile 10. PREGNANCY: "Is there any chance you are pregnant?" "When was your last menstrual period?"        n/A  Protocols used: [facility]

## 2019-04-14 DIAGNOSIS — M2550 Pain in unspecified joint: Secondary | ICD-10-CM | POA: Diagnosis not present

## 2019-04-14 DIAGNOSIS — E669 Obesity, unspecified: Secondary | ICD-10-CM | POA: Diagnosis not present

## 2019-04-14 DIAGNOSIS — Z79899 Other long term (current) drug therapy: Secondary | ICD-10-CM | POA: Diagnosis not present

## 2019-04-14 DIAGNOSIS — M150 Primary generalized (osteo)arthritis: Secondary | ICD-10-CM | POA: Diagnosis not present

## 2019-04-14 DIAGNOSIS — M542 Cervicalgia: Secondary | ICD-10-CM | POA: Diagnosis not present

## 2019-04-14 DIAGNOSIS — Z6832 Body mass index (BMI) 32.0-32.9, adult: Secondary | ICD-10-CM | POA: Diagnosis not present

## 2019-06-16 DIAGNOSIS — R413 Other amnesia: Secondary | ICD-10-CM | POA: Diagnosis not present

## 2019-07-24 ENCOUNTER — Other Ambulatory Visit: Payer: Self-pay

## 2019-07-24 ENCOUNTER — Encounter (HOSPITAL_COMMUNITY): Payer: Self-pay | Admitting: *Deleted

## 2019-07-24 ENCOUNTER — Emergency Department (HOSPITAL_COMMUNITY)
Admission: EM | Admit: 2019-07-24 | Discharge: 2019-07-24 | Disposition: A | Discharge status: Home / Self Care | Payer: Medicare PPO | Attending: Emergency Medicine | Admitting: Emergency Medicine

## 2019-07-24 DIAGNOSIS — R1011 Right upper quadrant pain: Secondary | ICD-10-CM | POA: Insufficient documentation

## 2019-07-24 DIAGNOSIS — R10816 Epigastric abdominal tenderness: Secondary | ICD-10-CM | POA: Diagnosis not present

## 2019-07-24 DIAGNOSIS — R110 Nausea: Secondary | ICD-10-CM | POA: Insufficient documentation

## 2019-07-24 DIAGNOSIS — R001 Bradycardia, unspecified: Secondary | ICD-10-CM | POA: Diagnosis not present

## 2019-07-24 LAB — COMPREHENSIVE METABOLIC PANEL
ALT: 19 U/L (ref 0–44)
AST: 21 U/L (ref 15–41)
Albumin: 4.4 g/dL (ref 3.5–5.0)
Alkaline Phosphatase: 75 U/L (ref 38–126)
Anion gap: 11 (ref 5–15)
BUN: 14 mg/dL (ref 8–23)
CO2: 24 mmol/L (ref 22–32)
Calcium: 10.5 mg/dL — ABNORMAL HIGH (ref 8.9–10.3)
Chloride: 105 mmol/L (ref 98–111)
Creatinine, Ser: 0.81 mg/dL (ref 0.44–1.00)
GFR calc Af Amer: >60 mL/min (ref >60)
GFR calc non Af Amer: >60 mL/min (ref >60)
Glucose, Bld: 122 mg/dL — ABNORMAL HIGH (ref 70–99)
Potassium: 3.8 mmol/L (ref 3.5–5.1)
Sodium: 140 mmol/L (ref 135–145)
Total Bilirubin: 1.5 mg/dL — ABNORMAL HIGH (ref 0.3–1.2)
Total Protein: 7.2 g/dL (ref 6.5–8.1)

## 2019-07-24 LAB — URINALYSIS, ROUTINE W REFLEX MICROSCOPIC
Bilirubin Urine: NEGATIVE
Glucose, UA: NEGATIVE mg/dL
Ketones, ur: NEGATIVE mg/dL
Leukocytes,Ua: NEGATIVE
Nitrite: NEGATIVE
Protein, ur: NEGATIVE mg/dL
Specific Gravity, Urine: 1.019 (ref 1.005–1.030)
pH: 5 (ref 5.0–8.0)

## 2019-07-24 LAB — CBC
HCT: 44 % (ref 36.0–46.0)
Hemoglobin: 14.9 g/dL (ref 12.0–15.0)
MCH: 30.1 pg (ref 26.0–34.0)
MCHC: 33.9 g/dL (ref 30.0–36.0)
MCV: 88.9 fL (ref 80.0–100.0)
Platelets: 285 10*3/uL (ref 150–400)
RBC: 4.95 MIL/uL (ref 3.87–5.11)
RDW: 12.3 % (ref 11.5–15.5)
WBC: 9.2 10*3/uL (ref 4.0–10.5)
nRBC: 0 % (ref 0.0–0.2)

## 2019-07-24 LAB — TROPONIN I (HIGH SENSITIVITY): Troponin I (High Sensitivity): 3 ng/L (ref <18)

## 2019-07-24 LAB — LIPASE, BLOOD: Lipase: 31 U/L (ref 11–51)

## 2019-07-24 MED ORDER — SODIUM CHLORIDE 0.9% FLUSH: 3.0000 mL | Freq: Once | INTRAVENOUS | Status: DC

## 2019-07-24 MED ORDER — SUCRALFATE 1 G PO TABS
1.0000 g | ORAL_TABLET | Freq: Once | ORAL | Status: AC
  Administered 2019-07-24: 1 g via ORAL
  Filled 2019-07-24: qty 1

## 2019-07-24 MED ORDER — FAMOTIDINE 20 MG PO TABS
40.0000 mg | ORAL_TABLET | Freq: Once | ORAL | Status: AC
  Administered 2019-07-24: 40 mg via ORAL
  Filled 2019-07-24: qty 2

## 2019-07-24 MED ORDER — SUCRALFATE 1 GM/10ML PO SUSP: 1.0000 g | Freq: Three times a day (TID) | ORAL | 0 refills | Status: DC

## 2019-07-24 NOTE — ED Provider Notes (Signed)
[facility] EMERGENCY DEPARTMENT Provider Note   CSN: [id number] Arrival date & time: 07/24/19  1806     History   Chief Complaint Chief Complaint  Patient presents with  . Abdominal Pain    HPI [name] Young is a 68 y.o. female with history of cholecystectomy presents to the ER for evaluation of abdominal pain that began 1 month ago.  It worsened last night while she was having dinner.  Abdominal pain is to the right upper quadrant and radiates to the epigastrium and right flank.  Described like an ache.  Last night she was eating spicy fish and could not eat it due to the queasiness and nausea that it caused her.  States when she eats ice cream or milk her symptoms improve.  Her discomfort has been constant since last night.  Has tried tums in the past without relief. No associated vomiting, pain in her chest, shortness of breath, diarrhea, constipation, dysuria, hematuria.  She had a bowel movement earlier this morning and she gave herself an enema and had another normal bowel movement at 4 PM.  She takes Celebrex for different areas of joint pain.  She denies any known gastric ulcers, acid reflux or GERD.  No alleviating factors.  States she used to have similar, but worse pain in the same area before her gallbladder was removed 02/2019.       HPI  Past Medical History:  Diagnosis Date  . Allergy   . Anemia   . Asthma   . Bronchitis   . Hyperlipidemia     Patient Active Problem List   Diagnosis Date Noted  . Cholecystitis with cholelithiasis 02/24/2019  . Seasonal allergies 10/30/2012    Past Surgical History:  Procedure Laterality Date  . CESAREAN SECTION    . CHOLECYSTECTOMY N/A 02/25/2019   Procedure: LAPAROSCOPIC CHOLECYSTECTOMY;  Surgeon: Berna Bue, MD;  Location: [facility];  Service: General;  Laterality: N/A;  . TUBAL LIGATION       OB History   No obstetric history on file.      Home Medications    Prior to Admission medications    Medication Sig Start Date End Date Taking? Authorizing Provider  acetaminophen (TYLENOL) 500 MG tablet Take 2 tablets (1,000 mg total) by mouth every 8 (eight) hours as needed for mild pain. 02/26/19   Rayburn, Alphonsus Sias, PA-C  ibuprofen (ADVIL) 200 MG tablet Take 3 tablets (600 mg total) by mouth every 6 (six) hours as needed for moderate pain. 02/26/19   Rayburn, Alphonsus Sias, PA-C  metroNIDAZOLE (METROGEL) 0.75 % gel Apply 1 application topically 2 (two) times daily.    [provider]  oxyCODONE (OXY IR/ROXICODONE) 5 MG immediate release tablet Take 1 tablet (5 mg total) by mouth every 4 (four) hours as needed for moderate pain, severe pain or breakthrough pain. 02/26/19   Rayburn, Alphonsus Sias, PA-C  sucralfate (CARAFATE) 1 GM/10ML suspension Take 10 mLs (1 g total) by mouth 4 (four) times daily -  with meals and at bedtime. 07/24/19   Liberty Handy, PA-C    Family History Family History  Problem Relation Age of Onset  . Diabetes Paternal Grandmother   . Diabetes Paternal Grandfather     Social History Social History   Tobacco Use  . Smoking status: Never Smoker  . Smokeless tobacco: Never Used  Substance Use Topics  . Alcohol use: No    Alcohol/week: 0.0 standard drinks  . Drug use: No  Allergies   Codeine, Latex, and Mobic [meloxicam]   Review of Systems Review of Systems  Gastrointestinal: Positive for abdominal pain and nausea.  All other systems reviewed and are negative.    Physical Exam Updated Vital Signs BP (!) 185/82   Pulse (!) 54   Temp 98.7 F (37.1 C) (Oral)   Resp 15   Ht 5' (1.524 m)   Wt 77.1 kg   SpO2 93%   BMI 33.20 kg/m   Physical Exam Vitals signs and nursing note reviewed.  Constitutional:      Appearance: She is well-developed.     Comments: Non toxic in NAD  HENT:     Head: Normocephalic and atraumatic.     Nose: Nose normal.  Eyes:     Conjunctiva/sclera: Conjunctivae normal.  Neck:     Musculoskeletal: Normal range of  motion.  Cardiovascular:     Rate and Rhythm: Normal rate and regular rhythm.     Comments: 1+ radial and DP pulses bilaterally. Pulmonary:     Effort: Pulmonary effort is normal.     Breath sounds: Normal breath sounds.  Abdominal:     General: Bowel sounds are normal.     Palpations: Abdomen is soft.     Tenderness: There is abdominal tenderness in the right upper quadrant and epigastric area.     Comments: No G/R/R. No suprapubic or CVA tenderness. Negative Murphy's and McBurney's. Active BS to lower quadrants.   Musculoskeletal: Normal range of motion.  Skin:    General: Skin is warm and dry.     Capillary Refill: Capillary refill takes less than 2 seconds.     Comments: Skin is normal to the abdomen and flank, no vesicular lesions or rash  Neurological:     Mental Status: She is alert.     Comments: Sensation and strength intact in upper and lower extremities  Psychiatric:        Behavior: Behavior normal.      ED Treatments / Results  Labs (all labs ordered are listed, but only abnormal results are displayed) Labs Reviewed  COMPREHENSIVE METABOLIC PANEL - Abnormal; Notable for the following components:      Result Value   Glucose, Bld 122 (*)    Calcium 10.5 (*)    Total Bilirubin 1.5 (*)    All other components within normal limits  URINALYSIS, ROUTINE W REFLEX MICROSCOPIC - Abnormal; Notable for the following components:   Hgb urine dipstick MODERATE (*)    Bacteria, UA RARE (*)    All other components within normal limits  URINE CULTURE  LIPASE, BLOOD  CBC  TROPONIN I (HIGH SENSITIVITY)    EKG None  Radiology No results found.  Procedures Procedures (including critical care time)  Medications Ordered in ED Medications  sodium chloride flush (NS) 0.9 % injection 3 mL (has no administration in time range)  sucralfate (CARAFATE) tablet 1 g (1 g Oral Given 07/24/19 2006)  famotidine (PEPCID) tablet 40 mg (40 mg Oral Given 07/24/19 2004)     Initial  Impression / Assessment and Plan / ED Course  I have reviewed the triage vital signs and the nursing notes.  Pertinent labs & imaging results that were available during my care of the patient were reviewed by me and considered in my medical decision making (see chart for details).  Highest on DDX includes viral gastro-versus GERD versus pancreatitis versus PUD versus pyelonephritis or renal stone although these latter 2 are unlikely as she has no  urinary symptoms, history kidney stones.  She has no associated chest pain or shortness of breath, pleuritic discomfort, neuro pulse deficits, lower extremity edema.  I considered cardiac or pulmonary/vascular process highly unlikely.  S/p cholecystectomy.  No right lower quadrant tenderness to suggest appendicitis.  ER work-up reviewed by me is vastly reassuring.  No leukocytosis.  Normal LFTs.  Normal lipase.  UA is without RBCs, signs of infection.  Given location of her pain, age EKG and troponin obtained.  No ischemic changes on EKG.  Troponin is undetectable.  Her discomfort has been there since last night, constant and my suspicion for ACS is very low.  I do not think she warrants a delta troponin since her pain has been there for greater than 3 hours.  Patient reevaluated, no worsening decline and states the taste of the medicine she obtained in the ER made her nauseous.  Repeat abdominal exam without any worsening signs.  No peritonitis.  High suspicion for GERD versus PUD versus gastritis.  She takes Celebrex frequently.  Her symptoms appear to be affected by spicy food and alleviated with milk/ice cream which suggest GI process.  Recommended PPI, Pepcid, diet modifications and close follow-up with PCP/GI.  Return precautions discussed.  Patient is comfortable with this. Final Clinical Impressions(s) / ED Diagnoses   Final diagnoses:  Right upper quadrant abdominal pain    ED Discharge Orders         Ordered    sucralfate (CARAFATE) 1  GM/10ML suspension  3 times daily with meals & bedtime     07/24/19 2057           Liberty HandyGibbons, [place]ah [place] J, PA-C 07/24/19 2155    Geoffery Lyonselo, Douglas, MD 07/25/19 1103

## 2019-07-24 NOTE — Discharge Instructions (Signed)
You were seen in the ER for right upper abdominal pain.  Lab work today including liver function panel, kidney function, urinalysis, cardiac work-up are normal.  Given your description and symptoms I suspect there is some degree of acid reflux or gastritis/inflammation of your stomach or ulcers.  Celebrex can cause this.  We will treat this with anti-acid medicines.  Start taking over the counter omeprazole to 40 mg daily, take on an empty stomach first thing in the morning and wait 20-30 min before eating.  Add famotidine 20 mg in the AM and PM.  Use sucralfate solution 20 min before meals and at bed time.   Avoid irritating foods and liquids such as alcohol, greasy/fatty or acidic foods. Avoid ibuprofen containing products.   Follow up with primary care doctor for further management of this if it persits.   Return to the ER for fever, chills, blood in vomit or stool, worsening localized abdominal pain to right right lower abdomen, inability to tolerate fluids, chest pain, shortness of breath, abnormal rash

## 2019-07-24 NOTE — ED Triage Notes (Signed)
The pt is c/o abd pain for 2-3 days with nausea no vomiting or diarrhea  gb removed April  This feels like the same pain

## 2019-07-27 DIAGNOSIS — M542 Cervicalgia: Secondary | ICD-10-CM | POA: Diagnosis not present

## 2019-07-27 LAB — URINE CULTURE: Culture: >=100000 — AB

## 2019-07-28 ENCOUNTER — Telehealth: Payer: Self-pay | Admitting: *Deleted

## 2019-07-28 NOTE — Telephone Encounter (Signed)
Post ED Visit - Positive Culture Follow-up  Culture report reviewed by antimicrobial stewardship pharmacist: [place] Team []  Elenor Quinones, Pharm.D. []  Heide Guile, Pharm.D., BCPS AQ-ID []  Parks Neptune, Pharm.D., BCPS []  Alycia Rossetti, Pharm.D., BCPS []  [place], Pharm.D., BCPS, AAHIVP []  Legrand Como, Pharm.D., BCPS, AAHIVP [x]  Salome Arnt, PharmD, BCPS []  Johnnette Gourd, PharmD, BCPS []  Hughes Better, PharmD, BCPS []  Leeroy Cha, PharmD []  Laqueta Linden, PharmD, BCPS []  Albertina Parr, PharmD  [facility] Team []  Leodis Sias, PharmD []  Lindell Spar, PharmD []  Royetta Asal, PharmD []  Graylin Shiver, Rph []  Rema Fendt) Glennon Mac, PharmD []  Arlyn Dunning, PharmD []  Netta Cedars, PharmD []  Dia Sitter, PharmD []  Leone Haven, PharmD []  Gretta Arab, PharmD []  Theodis Shove, PharmD []  Peggyann Juba, PharmD []  Reuel Boom, PharmD   Positive urine culture No UTI symptoms and no further patient follow-up is required at this time.  [name] Young 07/28/2019, 10:00 AM

## 2019-09-04 ENCOUNTER — Encounter (HOSPITAL_COMMUNITY): Payer: Self-pay | Admitting: Emergency Medicine

## 2019-09-04 ENCOUNTER — Emergency Department (HOSPITAL_COMMUNITY): Payer: Medicare PPO

## 2019-09-04 ENCOUNTER — Other Ambulatory Visit: Payer: Self-pay

## 2019-09-04 ENCOUNTER — Emergency Department (HOSPITAL_COMMUNITY)
Admission: EM | Admit: 2019-09-04 | Discharge: 2019-09-04 | Disposition: A | Discharge status: Home / Self Care | Payer: Medicare PPO | Attending: Emergency Medicine | Admitting: Emergency Medicine

## 2019-09-04 DIAGNOSIS — Z79899 Other long term (current) drug therapy: Secondary | ICD-10-CM | POA: Diagnosis not present

## 2019-09-04 DIAGNOSIS — R0602 Shortness of breath: Secondary | ICD-10-CM | POA: Diagnosis not present

## 2019-09-04 DIAGNOSIS — J45909 Unspecified asthma, uncomplicated: Secondary | ICD-10-CM | POA: Insufficient documentation

## 2019-09-04 DIAGNOSIS — R001 Bradycardia, unspecified: Secondary | ICD-10-CM | POA: Diagnosis not present

## 2019-09-04 DIAGNOSIS — R1013 Epigastric pain: Secondary | ICD-10-CM | POA: Diagnosis not present

## 2019-09-04 DIAGNOSIS — R1010 Upper abdominal pain, unspecified: Secondary | ICD-10-CM | POA: Insufficient documentation

## 2019-09-04 DIAGNOSIS — R109 Unspecified abdominal pain: Secondary | ICD-10-CM | POA: Diagnosis present

## 2019-09-04 LAB — HEPATIC FUNCTION PANEL
ALT: 16 U/L (ref 0–44)
AST: 20 U/L (ref 15–41)
Albumin: 4.2 g/dL (ref 3.5–5.0)
Alkaline Phosphatase: 64 U/L (ref 38–126)
Bilirubin, Direct: 0.1 mg/dL (ref 0.0–0.2)
Indirect Bilirubin: 1 mg/dL — ABNORMAL HIGH (ref 0.3–0.9)
Total Bilirubin: 1.1 mg/dL (ref 0.3–1.2)
Total Protein: 7.4 g/dL (ref 6.5–8.1)

## 2019-09-04 LAB — CBC
HCT: 41.7 % (ref 36.0–46.0)
Hemoglobin: 14.6 g/dL (ref 12.0–15.0)
MCH: 30.5 pg (ref 26.0–34.0)
MCHC: 35 g/dL (ref 30.0–36.0)
MCV: 87.1 fL (ref 80.0–100.0)
Platelets: 257 10*3/uL (ref 150–400)
RBC: 4.79 MIL/uL (ref 3.87–5.11)
RDW: 11.9 % (ref 11.5–15.5)
WBC: 9.7 10*3/uL (ref 4.0–10.5)
nRBC: 0 % (ref 0.0–0.2)

## 2019-09-04 LAB — BASIC METABOLIC PANEL
Anion gap: 12 (ref 5–15)
BUN: 9 mg/dL (ref 8–23)
CO2: 24 mmol/L (ref 22–32)
Calcium: 10.1 mg/dL (ref 8.9–10.3)
Chloride: 102 mmol/L (ref 98–111)
Creatinine, Ser: 0.87 mg/dL (ref 0.44–1.00)
GFR calc Af Amer: >60 mL/min (ref >60)
GFR calc non Af Amer: >60 mL/min (ref >60)
Glucose, Bld: 142 mg/dL — ABNORMAL HIGH (ref 70–99)
Potassium: 4.1 mmol/L (ref 3.5–5.1)
Sodium: 138 mmol/L (ref 135–145)

## 2019-09-04 LAB — LIPASE, BLOOD: Lipase: 20 U/L (ref 11–51)

## 2019-09-04 LAB — TROPONIN I (HIGH SENSITIVITY): Troponin I (High Sensitivity): 5 ng/L (ref <18)

## 2019-09-04 MED ORDER — PANTOPRAZOLE SODIUM 40 MG PO TBEC
80.0000 mg | DELAYED_RELEASE_TABLET | Freq: Once | ORAL | Status: AC
  Administered 2019-09-04: 80 mg via ORAL
  Filled 2019-09-04: qty 2

## 2019-09-04 MED ORDER — ALUM & MAG HYDROXIDE-SIMETH 200-200-20 MG/5ML PO SUSP
30.0000 mL | Freq: Once | ORAL | Status: AC
  Administered 2019-09-04: 30 mL via ORAL
  Filled 2019-09-04: qty 30

## 2019-09-04 MED ORDER — ONDANSETRON 4 MG PO TBDP
4.0000 mg | ORAL_TABLET | Freq: Once | ORAL | Status: AC
  Administered 2019-09-04: 4 mg via ORAL
  Filled 2019-09-04: qty 1

## 2019-09-04 MED ORDER — SODIUM CHLORIDE 0.9% FLUSH: 3.0000 mL | Freq: Once | INTRAVENOUS | Status: DC

## 2019-09-04 MED ORDER — SUCRALFATE 1 GM/10ML PO SUSP: 1.0000 g | Freq: Three times a day (TID) | ORAL | 0 refills | Status: DC

## 2019-09-04 MED ORDER — LIDOCAINE VISCOUS HCL 2 % MT SOLN
15.0000 mL | Freq: Once | OROMUCOSAL | Status: AC
  Administered 2019-09-04: 15 mL via ORAL
  Filled 2019-09-04: qty 15

## 2019-09-04 MED ORDER — HYDROCODONE-ACETAMINOPHEN 5-325 MG PO TABS: 1.0000 | ORAL_TABLET | ORAL | 0 refills | Status: DC | PRN

## 2019-09-04 MED ORDER — PANTOPRAZOLE SODIUM 40 MG PO TBEC: 40.0000 mg | DELAYED_RELEASE_TABLET | Freq: Every day | ORAL | 1 refills | Status: DC

## 2019-09-04 MED ORDER — ONDANSETRON 4 MG PO TBDP: 4.0000 mg | ORAL_TABLET | Freq: Four times a day (QID) | ORAL | 0 refills | Status: DC | PRN

## 2019-09-04 NOTE — ED Triage Notes (Signed)
C/o epigastric pain/ chest pain/burning onset today. Emesis x 1 today, +shob today.

## 2019-09-04 NOTE — Discharge Instructions (Addendum)
Your labs today were normal.  I recommend very close follow-up with gastroenterology as you may benefit from an endoscopy for further evaluation of your upper abdominal pain.   Please avoid NSAIDs such as aspirin (Goody powders), ibuprofen (Motrin, Advil), naproxen (Aleve) as these may worsen your symptoms.  Tylenol 1000 mg every 6 hours is safe to take as long as you have no history of liver problems (heavy alcohol use, cirrhosis, hepatitis).  Please avoid spicy, acidic (citrus fruits, tomato based sauces, salsa), greasy, fatty foods.  Please avoid caffeine and alcohol.     I would be cautious with using Celebrex as I am concerned this could make your abdominal pain worse.  Please contact your primary care physician for recommendations for management of your chronic arthritic pain.

## 2019-09-04 NOTE — ED Provider Notes (Signed)
TIME SEEN: 2:36 AM  CHIEF COMPLAINT: upper abdominal burning  HPI: Patient is a 68 year old female with history of asthma, hyperlipidemia who presents to the emergency department with 1 week of upper abdominal burning pain that radiates up into her chest.  Worse after eating.  No medications at home.  Had similar symptoms here in October and was discharged with Carafate.  She is not sure if this helped her symptoms.  She has had nausea and vomiting.  No hematemesis.  No BRBPR or melena.  Does take Celebrex regularly for rheumatoid arthritis but no other NSAIDs.  No history of gastritis, peptic ulcer.  No endoscopy.  Has had colonoscopy years ago.  GI is [facility].  Husband states that he does not feel that she has been feeling right since she had a cholecystectomy in May 2020.  Other previous abdominal surgeries include C-section.  She denies shortness of breath, cough, fevers, diarrhea, dysuria, hematuria, vaginal bleeding or discharge.  ROS: See HPI Constitutional: no fever  Eyes: no drainage  ENT: no runny nose   Cardiovascular:  no chest pain  Resp: no SOB  GI: no vomiting GU: no dysuria Integumentary: no rash  Allergy: no hives  Musculoskeletal: no leg swelling  Neurological: no slurred speech ROS otherwise negative  PAST MEDICAL HISTORY/PAST SURGICAL HISTORY:  Past Medical History:  Diagnosis Date  . Allergy   . Anemia   . Asthma   . Bronchitis   . Hyperlipidemia     MEDICATIONS:  Prior to Admission medications   Medication Sig Start Date End Date Taking? Authorizing Provider  acetaminophen (TYLENOL) 500 MG tablet Take 2 tablets (1,000 mg total) by mouth every 8 (eight) hours as needed for mild pain. 02/26/19   Rayburn, Alphonsus SiasKelly A, PA-C  ibuprofen (ADVIL) 200 MG tablet Take 3 tablets (600 mg total) by mouth every 6 (six) hours as needed for moderate pain. 02/26/19   Rayburn, Alphonsus SiasKelly A, PA-C  metroNIDAZOLE (METROGEL) 0.75 % gel Apply 1 application topically 2 (two) times daily.     [provider]  oxyCODONE (OXY IR/ROXICODONE) 5 MG immediate release tablet Take 1 tablet (5 mg total) by mouth every 4 (four) hours as needed for moderate pain, severe pain or breakthrough pain. 02/26/19   Rayburn, Alphonsus SiasKelly A, PA-C  sucralfate (CARAFATE) 1 GM/10ML suspension Take 10 mLs (1 g total) by mouth 4 (four) times daily -  with meals and at bedtime. 07/24/19   Liberty HandyGibbons, Claudia J, PA-C    ALLERGIES:  Allergies  Allergen Reactions  . Codeine Itching  . Latex Other (See Comments)    unknown  . Mobic [Meloxicam]     Dizzy/ lightheaded    SOCIAL HISTORY:  Social History   Tobacco Use  . Smoking status: Never Smoker  . Smokeless tobacco: Never Used  Substance Use Topics  . Alcohol use: No    Alcohol/week: 0.0 standard drinks    FAMILY HISTORY: Family History  Problem Relation Age of Onset  . Diabetes Paternal Grandmother   . Diabetes Paternal Grandfather     EXAM: BP (!) 171/67   Pulse (!) 52   Temp 97.6 F (36.4 C) (Oral)   Resp 18   Ht 5\' 2"  (1.575 m)   Wt 77 kg   SpO2 100%   BMI 31.05 kg/m  CONSTITUTIONAL: Alert and oriented and responds appropriately to questions. Well-appearing; well-nourished HEAD: Normocephalic EYES: Conjunctivae clear, pupils appear equal, EOM appear intact ENT: normal nose; moist mucous membranes NECK: Supple, normal ROM CARD: RRR;  S1 and S2 appreciated; no murmurs, no clicks, no rubs, no gallops RESP: Normal chest excursion without splinting or tachypnea; breath sounds clear and equal bilaterally; no wheezes, no rhonchi, no rales, no hypoxia or respiratory distress, speaking full sentences ABD/GI: Normal bowel sounds; non-distended; soft, mildly tender in the epigastric region, no rebound, no guarding, no peritoneal signs, no hepatosplenomegaly BACK:  The back appears normal EXT: Normal ROM in all joints; no deformity noted, no edema; no cyanosis SKIN: Normal color for age and race; warm; no rash on exposed skin NEURO: Moves  all extremities equally PSYCH: Flat affect.  MEDICAL DECISION MAKING: Patient here with upper abdominal burning pain worse after eating.  Low suspicion for ACS, PE or dissection.  Troponin obtained in triage is normal and EKG shows no changes.  X-ray shows bronchial thickening likely secondary to her asthma.  She has no shortness of breath currently and her lungs are clear.  No hypoxia.  Presented here similarly in October and was discharged on Carafate.  She is not on a PPI chronically.  Have advised her to discontinue her Celebrex as I feel this could be worsening her symptoms and recommended close follow-up with [facility].  Will give Protonix, GI cocktail, Zofran and reassess here.  LFTs, lipase pending.  Doubt appendicitis, perforation, bowel obstruction, colitis, diverticulitis, pyelonephritis, kidney stone, UTI.  ED PROGRESS: Patient reports some improvement in pain after above medications.  LFTs, lipase normal.  Abdominal exam still benign.  I do not feel she needs CT imaging of her abdomen pelvis and to discuss this with patient and husband at bedside.  She reports she has not been taking her Celebrex for the past 2 weeks.  Have recommended she start a PPI daily and use Carafate.  Offered further pain medication which she declines.  States that she is ready to be discharged.  Recommended close follow-up with [facility] gastroenterology.   At this time, I do not feel there is any life-threatening condition present. I have reviewed, interpreted and discussed all results (EKG, imaging, lab, urine as appropriate) and exam findings with patient/family. I have reviewed nursing notes and appropriate previous records.  I feel the patient is safe to be discharged home without further emergent workup and can continue workup as an outpatient as needed. Discussed usual and customary return precautions. Patient/family verbalize understanding and are comfortable with this plan.  Outpatient follow-up has been provided as  needed. All questions have been answered.      EKG Interpretation  Date/Time:  Saturday September 04 2019 00:19:44 EST Ventricular Rate:  48 PR Interval:  194 QRS Duration: 72 QT Interval:  446 QTC Calculation: 398 R Axis:   -4 Text Interpretation: Sinus bradycardia Cannot rule out Anterior infarct , age undetermined Abnormal ECG No significant change since last tracing Confirmed by Pryor Curia [id number]) on 09/04/2019 2:36:21 AM         [name] was evaluated in Emergency Department on 09/04/2019 for the symptoms described in the history of present illness. She was evaluated in the context of the global COVID-19 pandemic, which necessitated consideration that the patient might be at risk for infection with the SARS-CoV-2 virus that causes COVID-19. Institutional protocols and algorithms that pertain to the evaluation of patients at risk for COVID-19 are in a state of rapid change based on information released by regulatory bodies including the CDC and federal and state organizations. These policies and algorithms were followed during the patient's care in the ED.  Patient was seen  wearing N95, face shield, gloves.    Matteo Banke, Layla Maw, DO 09/04/19 [phone]

## 2019-09-16 DIAGNOSIS — M150 Primary generalized (osteo)arthritis: Secondary | ICD-10-CM | POA: Diagnosis not present

## 2019-09-16 DIAGNOSIS — M2550 Pain in unspecified joint: Secondary | ICD-10-CM | POA: Diagnosis not present

## 2019-09-16 DIAGNOSIS — Z79899 Other long term (current) drug therapy: Secondary | ICD-10-CM | POA: Diagnosis not present

## 2019-09-16 DIAGNOSIS — M545 Low back pain: Secondary | ICD-10-CM | POA: Diagnosis not present

## 2019-09-20 ENCOUNTER — Emergency Department (HOSPITAL_COMMUNITY)
Admission: EM | Admit: 2019-09-20 | Discharge: 2019-09-21 | Disposition: A | Discharge status: Home / Self Care | Payer: Medicare PPO | Source: Home / Self Care

## 2019-09-20 ENCOUNTER — Other Ambulatory Visit: Payer: Self-pay

## 2019-09-20 ENCOUNTER — Ambulatory Visit
Admission: RE | Admit: 2019-09-20 | Discharge: 2019-09-20 | Disposition: A | Discharge status: Home / Self Care | Payer: Medicare PPO | Source: Ambulatory Visit | Attending: Internal Medicine | Admitting: Internal Medicine

## 2019-09-20 ENCOUNTER — Other Ambulatory Visit: Payer: Self-pay | Admitting: Internal Medicine

## 2019-09-20 ENCOUNTER — Encounter (HOSPITAL_COMMUNITY): Payer: Self-pay

## 2019-09-20 DIAGNOSIS — K5730 Diverticulosis of large intestine without perforation or abscess without bleeding: Secondary | ICD-10-CM | POA: Diagnosis not present

## 2019-09-20 DIAGNOSIS — J45909 Unspecified asthma, uncomplicated: Secondary | ICD-10-CM | POA: Diagnosis not present

## 2019-09-20 DIAGNOSIS — R1084 Generalized abdominal pain: Secondary | ICD-10-CM | POA: Diagnosis not present

## 2019-09-20 DIAGNOSIS — R17 Unspecified jaundice: Secondary | ICD-10-CM | POA: Diagnosis not present

## 2019-09-20 DIAGNOSIS — Z833 Family history of diabetes mellitus: Secondary | ICD-10-CM | POA: Diagnosis not present

## 2019-09-20 DIAGNOSIS — R7401 Elevation of levels of liver transaminase levels: Secondary | ICD-10-CM | POA: Diagnosis not present

## 2019-09-20 DIAGNOSIS — Z801 Family history of malignant neoplasm of trachea, bronchus and lung: Secondary | ICD-10-CM | POA: Diagnosis not present

## 2019-09-20 DIAGNOSIS — K831 Obstruction of bile duct: Secondary | ICD-10-CM | POA: Diagnosis not present

## 2019-09-20 DIAGNOSIS — Z9049 Acquired absence of other specified parts of digestive tract: Secondary | ICD-10-CM | POA: Diagnosis not present

## 2019-09-20 DIAGNOSIS — Z79899 Other long term (current) drug therapy: Secondary | ICD-10-CM | POA: Diagnosis not present

## 2019-09-20 DIAGNOSIS — K219 Gastro-esophageal reflux disease without esophagitis: Secondary | ICD-10-CM | POA: Diagnosis present

## 2019-09-20 DIAGNOSIS — R1011 Right upper quadrant pain: Secondary | ICD-10-CM | POA: Diagnosis not present

## 2019-09-20 DIAGNOSIS — K859 Acute pancreatitis without necrosis or infection, unspecified: Secondary | ICD-10-CM | POA: Diagnosis not present

## 2019-09-20 DIAGNOSIS — E785 Hyperlipidemia, unspecified: Secondary | ICD-10-CM | POA: Diagnosis present

## 2019-09-20 DIAGNOSIS — Z20828 Contact with and (suspected) exposure to other viral communicable diseases: Secondary | ICD-10-CM | POA: Diagnosis present

## 2019-09-20 DIAGNOSIS — K851 Biliary acute pancreatitis without necrosis or infection: Secondary | ICD-10-CM | POA: Diagnosis not present

## 2019-09-20 DIAGNOSIS — K9189 Other postprocedural complications and disorders of digestive system: Secondary | ICD-10-CM | POA: Diagnosis present

## 2019-09-20 DIAGNOSIS — K8051 Calculus of bile duct without cholangitis or cholecystitis with obstruction: Secondary | ICD-10-CM | POA: Diagnosis present

## 2019-09-20 DIAGNOSIS — R109 Unspecified abdominal pain: Secondary | ICD-10-CM | POA: Insufficient documentation

## 2019-09-20 DIAGNOSIS — K8050 Calculus of bile duct without cholangitis or cholecystitis without obstruction: Secondary | ICD-10-CM | POA: Diagnosis not present

## 2019-09-20 DIAGNOSIS — R935 Abnormal findings on diagnostic imaging of other abdominal regions, including retroperitoneum: Secondary | ICD-10-CM | POA: Diagnosis not present

## 2019-09-20 DIAGNOSIS — Y836 Removal of other organ (partial) (total) as the cause of abnormal reaction of the patient, or of later complication, without mention of misadventure at the time of the procedure: Secondary | ICD-10-CM | POA: Diagnosis present

## 2019-09-20 DIAGNOSIS — K838 Other specified diseases of biliary tract: Secondary | ICD-10-CM | POA: Diagnosis not present

## 2019-09-20 DIAGNOSIS — R932 Abnormal findings on diagnostic imaging of liver and biliary tract: Secondary | ICD-10-CM | POA: Diagnosis not present

## 2019-09-20 DIAGNOSIS — Z03818 Encounter for observation for suspected exposure to other biological agents ruled out: Secondary | ICD-10-CM | POA: Diagnosis not present

## 2019-09-20 DIAGNOSIS — Z5321 Procedure and treatment not carried out due to patient leaving prior to being seen by health care provider: Secondary | ICD-10-CM | POA: Insufficient documentation

## 2019-09-20 LAB — CBC
HCT: 42.2 % (ref 36.0–46.0)
Hemoglobin: 14.6 g/dL (ref 12.0–15.0)
MCH: 31 pg (ref 26.0–34.0)
MCHC: 34.6 g/dL (ref 30.0–36.0)
MCV: 89.6 fL (ref 80.0–100.0)
Platelets: 275 10*3/uL (ref 150–400)
RBC: 4.71 MIL/uL (ref 3.87–5.11)
RDW: 12.8 % (ref 11.5–15.5)
WBC: 6.7 10*3/uL (ref 4.0–10.5)
nRBC: 0 % (ref 0.0–0.2)

## 2019-09-20 LAB — COMPREHENSIVE METABOLIC PANEL
ALT: 255 U/L — ABNORMAL HIGH (ref 0–44)
AST: 135 U/L — ABNORMAL HIGH (ref 15–41)
Albumin: 3.8 g/dL (ref 3.5–5.0)
Alkaline Phosphatase: 302 U/L — ABNORMAL HIGH (ref 38–126)
Anion gap: 12 (ref 5–15)
BUN: 10 mg/dL (ref 8–23)
CO2: 25 mmol/L (ref 22–32)
Calcium: 9.8 mg/dL (ref 8.9–10.3)
Chloride: 103 mmol/L (ref 98–111)
Creatinine, Ser: 0.86 mg/dL (ref 0.44–1.00)
GFR calc Af Amer: >60 mL/min (ref >60)
GFR calc non Af Amer: >60 mL/min (ref >60)
Glucose, Bld: 97 mg/dL (ref 70–99)
Potassium: 3.1 mmol/L — ABNORMAL LOW (ref 3.5–5.1)
Sodium: 140 mmol/L (ref 135–145)
Total Bilirubin: 9.1 mg/dL — ABNORMAL HIGH (ref 0.3–1.2)
Total Protein: 7.4 g/dL (ref 6.5–8.1)

## 2019-09-20 LAB — LIPASE, BLOOD: Lipase: 2976 U/L — ABNORMAL HIGH (ref 11–51)

## 2019-09-20 MED ORDER — IOPAMIDOL (ISOVUE-300) INJECTION 61%
100.0000 mL | Freq: Once | INTRAVENOUS | Status: AC | PRN
  Administered 2019-09-20: 100 mL via INTRAVENOUS

## 2019-09-20 NOTE — ED Triage Notes (Signed)
Pt arrives POV for eval of abd pain and poss elevated LFTS. Pt had CT scan today showing duct dilation. Pt reports RUQ abd pain. Denies N/V/D.

## 2019-09-20 NOTE — ED Notes (Signed)
Pt stated she is leaving. 

## 2019-09-20 NOTE — ED Notes (Signed)
Pt stated that she is thinking about going home due to the wait.

## 2019-09-21 ENCOUNTER — Emergency Department (HOSPITAL_COMMUNITY): Payer: Medicare PPO

## 2019-09-21 ENCOUNTER — Other Ambulatory Visit: Payer: Self-pay

## 2019-09-21 ENCOUNTER — Encounter (HOSPITAL_COMMUNITY): Payer: Self-pay | Admitting: Emergency Medicine

## 2019-09-21 ENCOUNTER — Inpatient Hospital Stay (HOSPITAL_COMMUNITY)
Admission: EM | Admit: 2019-09-21 | Discharge: 2019-09-24 | DRG: 439 | Disposition: A | Discharge status: Home / Self Care | Payer: Medicare PPO | Attending: Internal Medicine | Admitting: Internal Medicine

## 2019-09-21 DIAGNOSIS — K9189 Other postprocedural complications and disorders of digestive system: Secondary | ICD-10-CM | POA: Diagnosis present

## 2019-09-21 DIAGNOSIS — Z801 Family history of malignant neoplasm of trachea, bronchus and lung: Secondary | ICD-10-CM | POA: Diagnosis not present

## 2019-09-21 DIAGNOSIS — K8051 Calculus of bile duct without cholangitis or cholecystitis with obstruction: Secondary | ICD-10-CM | POA: Diagnosis present

## 2019-09-21 DIAGNOSIS — K831 Obstruction of bile duct: Secondary | ICD-10-CM | POA: Diagnosis not present

## 2019-09-21 DIAGNOSIS — Z9049 Acquired absence of other specified parts of digestive tract: Secondary | ICD-10-CM | POA: Diagnosis not present

## 2019-09-21 DIAGNOSIS — R1011 Right upper quadrant pain: Secondary | ICD-10-CM

## 2019-09-21 DIAGNOSIS — K859 Acute pancreatitis without necrosis or infection, unspecified: Secondary | ICD-10-CM

## 2019-09-21 DIAGNOSIS — Z79899 Other long term (current) drug therapy: Secondary | ICD-10-CM

## 2019-09-21 DIAGNOSIS — K851 Biliary acute pancreatitis without necrosis or infection: Principal | ICD-10-CM

## 2019-09-21 DIAGNOSIS — Z20828 Contact with and (suspected) exposure to other viral communicable diseases: Secondary | ICD-10-CM | POA: Diagnosis present

## 2019-09-21 DIAGNOSIS — Z833 Family history of diabetes mellitus: Secondary | ICD-10-CM | POA: Diagnosis not present

## 2019-09-21 DIAGNOSIS — Y836 Removal of other organ (partial) (total) as the cause of abnormal reaction of the patient, or of later complication, without mention of misadventure at the time of the procedure: Secondary | ICD-10-CM | POA: Diagnosis present

## 2019-09-21 DIAGNOSIS — K8050 Calculus of bile duct without cholangitis or cholecystitis without obstruction: Secondary | ICD-10-CM

## 2019-09-21 DIAGNOSIS — K219 Gastro-esophageal reflux disease without esophagitis: Secondary | ICD-10-CM | POA: Diagnosis present

## 2019-09-21 DIAGNOSIS — E785 Hyperlipidemia, unspecified: Secondary | ICD-10-CM | POA: Diagnosis present

## 2019-09-21 HISTORY — DX: Acute pancreatitis without necrosis or infection, unspecified: K85.90

## 2019-09-21 HISTORY — DX: Obstruction of bile duct: K83.1

## 2019-09-21 HISTORY — DX: Endometriosis, unspecified: N80.9

## 2019-09-21 LAB — RESPIRATORY PANEL BY RT PCR (FLU A&B, COVID)
Influenza A by PCR: NEGATIVE
Influenza B by PCR: NEGATIVE
SARS Coronavirus 2 by RT PCR: NEGATIVE

## 2019-09-21 LAB — URINALYSIS, MICROSCOPIC (REFLEX)

## 2019-09-21 LAB — COMPREHENSIVE METABOLIC PANEL
ALT: 245 U/L — ABNORMAL HIGH (ref 0–44)
AST: 144 U/L — ABNORMAL HIGH (ref 15–41)
Albumin: 4.1 g/dL (ref 3.5–5.0)
Alkaline Phosphatase: 332 U/L — ABNORMAL HIGH (ref 38–126)
Anion gap: 13 (ref 5–15)
BUN: 14 mg/dL (ref 8–23)
CO2: 25 mmol/L (ref 22–32)
Calcium: 9.7 mg/dL (ref 8.9–10.3)
Chloride: 101 mmol/L (ref 98–111)
Creatinine, Ser: 0.82 mg/dL (ref 0.44–1.00)
GFR calc Af Amer: >60 mL/min (ref >60)
GFR calc non Af Amer: >60 mL/min (ref >60)
Glucose, Bld: 121 mg/dL — ABNORMAL HIGH (ref 70–99)
Potassium: 3.5 mmol/L (ref 3.5–5.1)
Sodium: 139 mmol/L (ref 135–145)
Total Bilirubin: 9.9 mg/dL — ABNORMAL HIGH (ref 0.3–1.2)
Total Protein: 7.4 g/dL (ref 6.5–8.1)

## 2019-09-21 LAB — CBC
HCT: 44.1 % (ref 36.0–46.0)
Hemoglobin: 14.8 g/dL (ref 12.0–15.0)
MCH: 30.4 pg (ref 26.0–34.0)
MCHC: 33.6 g/dL (ref 30.0–36.0)
MCV: 90.6 fL (ref 80.0–100.0)
Platelets: 306 10*3/uL (ref 150–400)
RBC: 4.87 MIL/uL (ref 3.87–5.11)
RDW: 13 % (ref 11.5–15.5)
WBC: 6.3 10*3/uL (ref 4.0–10.5)
nRBC: 0 % (ref 0.0–0.2)

## 2019-09-21 LAB — LIPASE, BLOOD: Lipase: 3204 U/L — ABNORMAL HIGH (ref 11–51)

## 2019-09-21 LAB — URINALYSIS, ROUTINE W REFLEX MICROSCOPIC
Glucose, UA: 100 mg/dL — AB
Ketones, ur: 15 mg/dL — AB
Nitrite: POSITIVE — AB
Protein, ur: 100 mg/dL — AB
Specific Gravity, Urine: >1.03 — ABNORMAL HIGH (ref 1.005–1.030)
pH: 5 (ref 5.0–8.0)

## 2019-09-21 MED ORDER — ACETAMINOPHEN 650 MG RE SUPP: 650.0000 mg | Freq: Four times a day (QID) | RECTAL | Status: DC | PRN

## 2019-09-21 MED ORDER — DEXTROSE-NACL 5-0.45 % IV SOLN
INTRAVENOUS | Status: DC
  Administered 2019-09-21: 20:00:00 via INTRAVENOUS

## 2019-09-21 MED ORDER — CELECOXIB 200 MG PO CAPS: 200.0000 mg | ORAL_CAPSULE | Freq: Two times a day (BID) | ORAL | Status: DC

## 2019-09-21 MED ORDER — ENOXAPARIN SODIUM 40 MG/0.4ML ~~LOC~~ SOLN
40.0000 mg | SUBCUTANEOUS | Status: DC
  Administered 2019-09-21 – 2019-09-23 (×3): 40 mg via SUBCUTANEOUS
  Filled 2019-09-21 (×3): qty 0.4

## 2019-09-21 MED ORDER — PANTOPRAZOLE SODIUM 40 MG PO TBEC
40.0000 mg | DELAYED_RELEASE_TABLET | Freq: Every day | ORAL | Status: DC
  Administered 2019-09-21 – 2019-09-24 (×3): 40 mg via ORAL
  Filled 2019-09-21 (×4): qty 1

## 2019-09-21 MED ORDER — ONDANSETRON 4 MG PO TBDP: 4.0000 mg | ORAL_TABLET | Freq: Four times a day (QID) | ORAL | Status: DC | PRN

## 2019-09-21 MED ORDER — SENNA 8.6 MG PO TABS
1.0000 | ORAL_TABLET | Freq: Two times a day (BID) | ORAL | Status: DC
  Administered 2019-09-22 – 2019-09-24 (×4): 8.6 mg via ORAL
  Filled 2019-09-21 (×5): qty 1

## 2019-09-21 MED ORDER — ONDANSETRON HCL 4 MG/2ML IJ SOLN: 4.0000 mg | Freq: Once | INTRAMUSCULAR | Status: DC

## 2019-09-21 MED ORDER — TRAZODONE HCL 50 MG PO TABS
25.0000 mg | ORAL_TABLET | Freq: Every day | ORAL | Status: DC
  Administered 2019-09-22 – 2019-09-23 (×2): 25 mg via ORAL
  Filled 2019-09-21 (×2): qty 1

## 2019-09-21 MED ORDER — HYDROMORPHONE HCL 1 MG/ML IJ SOLN: 0.5000 mg | Freq: Once | INTRAMUSCULAR | Status: DC

## 2019-09-21 MED ORDER — SODIUM CHLORIDE 0.9 % IV BOLUS
500.0000 mL | Freq: Once | INTRAVENOUS | Status: AC
  Administered 2019-09-21: 500 mL via INTRAVENOUS

## 2019-09-21 MED ORDER — ACETAMINOPHEN 325 MG PO TABS: 650.0000 mg | ORAL_TABLET | Freq: Four times a day (QID) | ORAL | Status: DC | PRN

## 2019-09-21 MED ORDER — ACETAMINOPHEN 500 MG PO TABS: 1000.0000 mg | ORAL_TABLET | Freq: Three times a day (TID) | ORAL | Status: DC | PRN

## 2019-09-21 MED ORDER — SUCRALFATE 1 GM/10ML PO SUSP
1.0000 g | Freq: Three times a day (TID) | ORAL | Status: DC
  Administered 2019-09-22 – 2019-09-24 (×7): 1 g via ORAL
  Filled 2019-09-21 (×10): qty 10

## 2019-09-21 MED ORDER — KETOROLAC TROMETHAMINE 15 MG/ML IJ SOLN
15.0000 mg | Freq: Four times a day (QID) | INTRAMUSCULAR | Status: DC
  Administered 2019-09-22: 13:00:00 15 mg via INTRAVENOUS
  Filled 2019-09-21 (×2): qty 1

## 2019-09-21 MED ORDER — KETOROLAC TROMETHAMINE 15 MG/ML IJ SOLN: 15.0000 mg | Freq: Four times a day (QID) | INTRAMUSCULAR | Status: DC | PRN

## 2019-09-21 NOTE — H&P (Signed)
History and Physical    [name] Young ZOX:[id number] DOB: [birth year] DOA: 09/21/2019  PCP: Renford Dills, MD (Confirm with patient/family/NH records and if not entered, this has to be entered at [facility] point of entry) Patient coming from: home  I have personally briefly reviewed patient's old medical records in [facility]  Chief Complaint: RUQ abdominal pain  HPI: [name] Young is a 68 y.o. female with medical history significant of  c/o 1 month hx ruq and epigastric abdominal pain. Symptoms gradual onset, dull,  mod-severe, constant, persistent, radiating to back, slowly worse. Nausea, no vomiting or diarrhea. No fever or chills. Saw pcp yesterday, labs today, and told abnormal labs and to go to ED. No wt loss. No fever or chills. Hx cholecystecomy 02/2019.   ED Course: In ED hemodynamically stable. Lab revealed Lipase of 3,024, AST 144, ALT 245. CT abd/pelvic with biliary dilatation w/o stone or mass. Patient is jaundiced and has persistent Nausea and pain. [facility] asked to admit for medical mgt.   Review of Systems: As per HPI otherwise 10 point review of systems negative.    Past Medical History:  Diagnosis Date  . Allergy   . Anemia   . Asthma   . Bronchitis   . Endometriosis   . Hyperlipidemia     Past Surgical History:  Procedure Laterality Date  . CESAREAN SECTION    . CHOLECYSTECTOMY N/A 02/25/2019   Procedure: LAPAROSCOPIC CHOLECYSTECTOMY;  Surgeon: Berna Bue, MD;  Location: [facility];  Service: General;  Laterality: N/A;  . laproscopic resection ovrian cyst    . TUBAL LIGATION    \ Social Hx - Married 1977 (43 years), 3 sons. 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 Lives with husband, who is diabetic.   reports that she has never smoked. She has never used smokeless tobacco. She reports that she does not drink alcohol or use drugs.  Allergies  Allergen Reactions  . Codeine Itching  . Latex Dermatitis and Other (See Comments)    Irritates the skin   . Mobic [Meloxicam] Other (See Comments)    Dizzy/ lightheaded  . Tape Dermatitis and Other (See Comments)    Paper tape is preferred     Family History  Problem Relation Age of Onset  . Arthritis/Rheumatoid Mother   . Osteoarthritis Father   . Lung cancer Father   . Diabetes Paternal Grandmother   . Diabetes Paternal Grandfather    Unacceptable: Noncontributory, unremarkable, or negative. Acceptable: Family history reviewed and not pertinent (If you reviewed it)  Prior to Admission medications   Medication Sig Start Date End Date Taking? Authorizing Provider  acetaminophen (TYLENOL) 500 MG tablet Take 2 tablets (1,000 mg total) by mouth every 8 (eight) hours as needed for mild pain. 02/26/19  Yes Rayburn, Alphonsus Sias, PA-C  celecoxib (CELEBREX) 200 MG capsule Take 200 mg by mouth 2 (two) times daily. 07/20/19  Yes [provider]  HYDROcodone-acetaminophen (NORCO/VICODIN) 5-325 MG tablet Take 1 tablet by mouth every 4 (four) hours as needed. 09/04/19  Yes Ward, Kristen N, DO  ondansetron (ZOFRAN ODT) 4 MG disintegrating tablet Take 1 tablet (4 mg total) by mouth every 6 (six) hours as needed. 09/04/19  Yes Ward, Kristen N, DO  pantoprazole (PROTONIX) 40 MG tablet Take 1 tablet (40 mg total) by mouth daily. 09/04/19  Yes Ward, Baxter Hire N, DO  sucralfate (CARAFATE) 1 g tablet Take 1 g by mouth 4 (four) times daily. 09/04/19  Yes [provider]  sucralfate (  CARAFATE) 1 GM/10ML suspension Take 10 mLs (1 g total) by mouth 4 (four) times daily -  with meals and at bedtime. Patient not taking: Reported on 09/21/2019 09/04/19   Ward, Layla MawKristen N, DO    Physical Exam: Vitals:   09/21/19 1156 09/21/19 1443 09/21/19 1600 09/21/19 1615  BP:  125/76 (!) 145/78 136/79  Pulse:  60 60   Resp:  16 20 (!) 21  Temp:      TempSrc:      SpO2:  96% 100%   Weight: 77 kg     Height: 5\' 2"  (1.575 m)       Constitutional: NAD, calm, comfortable Vitals:   09/21/19 1156 09/21/19 1443  09/21/19 1600 09/21/19 1615  BP:  125/76 (!) 145/78 136/79  Pulse:  60 60   Resp:  16 20 (!) 21  Temp:      TempSrc:      SpO2:  96% 100%   Weight: 77 kg     Height: 5\' 2"  (1.575 m)      Gneral appearance:  Jaundiced woman in no distress. Eyes: PERRL, lids nl, conjunctivae icteric, PERRLA ENMT: Mucous membranes are moist. Posterior pharynx clear of any exudate or lesions.Normal dentition.  Neck: normal, supple, no masses, no thyromegaly Respiratory: clear to auscultation bilaterally, no wheezing, no crackles. Normal respiratory effort. No accessory muscle use.  Cardiovascular: Regular rate and rhythm, no murmurs / rubs / gallops. No extremity edema. 2+ pedal pulses. No carotid bruits.  Abdomen: Hypoactive BS, no guarding, no rebound. Tender RUQ, very tender epigastrum Musculoskeletal: no clubbing / cyanosis. No joint deformity upper and lower extremities. Good ROM, no contractures. Normal muscle tone.  Skin: no rashes, lesions, ulcers. No induration Neurologic: CN 2-12 grossly intact. Sensation intact, DTR normal. Strength 5/5 in all 4.  Psychiatric: Normal judgment and insight. Alert and oriented x 3. Normal mood.   (Anything < 9 systems with 2 bullets each down codes to level 1) (If patient refuses exam can't bill higher level) (Make sure to document decubitus ulcers present on admission -- if possible -- and whether patient has chronic indwelling catheter at time of admission)  Labs on Admission: I have personally reviewed following labs and imaging studies  CBC: Recent Labs  Lab 09/20/19 1808 09/21/19 1206  WBC 6.7 6.3  HGB 14.6 14.8  HCT 42.2 44.1  MCV 89.6 90.6  PLT 275 306   Basic Metabolic Panel: Recent Labs  Lab 09/20/19 1808 09/21/19 1206  NA 140 139  K 3.1* 3.5  CL 103 101  CO2 25 25  GLUCOSE 97 121*  BUN 10 14  CREATININE 0.86 0.82  CALCIUM 9.8 9.7   GFR: Estimated Creatinine Clearance: 64.9 mL/min (by C-G formula based on SCr of 0.82 mg/dL). Liver  Function Tests: Recent Labs  Lab 09/20/19 1808 09/21/19 1206  AST 135* 144*  ALT 255* 245*  ALKPHOS 302* 332*  BILITOT 9.1* 9.9*  PROT 7.4 7.4  ALBUMIN 3.8 4.1   Recent Labs  Lab 09/20/19 1808 09/21/19 1206  LIPASE 2,976* 3,204*   No results for input(s): AMMONIA in the last 168 hours. Coagulation Profile: No results for input(s): INR, PROTIME in the last 168 hours. Cardiac Enzymes: No results for input(s): CKTOTAL, CKMB, CKMBINDEX, TROPONINI in the last 168 hours. BNP (last 3 results) No results for input(s): PROBNP in the last 8760 hours. HbA1C: No results for input(s): HGBA1C in the last 72 hours. CBG: No results for input(s): GLUCAP in the last 168 hours.  Lipid Profile: No results for input(s): CHOL, HDL, LDLCALC, TRIG, CHOLHDL, LDLDIRECT in the last 72 hours. Thyroid Function Tests: No results for input(s): TSH, T4TOTAL, FREET4, T3FREE, THYROIDAB in the last 72 hours. Anemia Panel: No results for input(s): VITAMINB12, FOLATE, FERRITIN, TIBC, IRON, RETICCTPCT in the last 72 hours. Urine analysis:    Component Value Date/Time   COLORURINE AMBER (A) 09/21/2019 1159   APPEARANCEUR TURBID (A) 09/21/2019 1159   LABSPEC >1.030 (H) 09/21/2019 1159   PHURINE 5.0 09/21/2019 1159   GLUCOSEU 100 (A) 09/21/2019 1159   HGBUR SMALL (A) 09/21/2019 1159   BILIRUBINUR LARGE (A) 09/21/2019 1159   KETONESUR 15 (A) 09/21/2019 1159   PROTEINUR 100 (A) 09/21/2019 1159   UROBILINOGEN 1.0 07/29/2015 0209   NITRITE POSITIVE (A) 09/21/2019 1159   LEUKOCYTESUR TRACE (A) 09/21/2019 1159    Radiological Exams on Admission: Ct Abdomen Pelvis W Contrast  Result Date: 09/20/2019 CLINICAL DATA:  Burning sensation in the abdomen with increased liver function studies and elevated bilirubin. Possible obstructive jaundice. EXAM: CT ABDOMEN AND PELVIS WITH CONTRAST TECHNIQUE: Multidetector CT imaging of the abdomen and pelvis was performed using the standard protocol following bolus  administration of intravenous contrast. CONTRAST:  121mL ISOVUE-300 IOPAMIDOL (ISOVUE-300) INJECTION 61% COMPARISON:  None. FINDINGS: Lower chest: The lung bases are clear of an acute process. No pleural effusions are identified. The heart is normal in size. No pericardial effusion. Hepatobiliary: No focal hepatic lesions are identified. There is intra and extrahepatic biliary dilatation. The common bile duct in the porta hepatis measures a maximum of 13 mm and measures a maximum of 9.5 mm in the head of the pancreas. It does taper down to the ampulla. There could be a distal stricture. I do not see any definite obstructing common bile duct stones and no obvious ampullary lesion or pancreatic head mass. The gallbladder is surgically absent. History of cholecystectomy in May 2020. Pancreas: No mass or inflammation. Normal caliber and course of the main pancreatic duct. The distal duct is slightly dilated measuring 4 mm in the head of the pancreas. Spleen: Normal size. Small scattered lesions on the portal venous phase images are not persistent on the delayed images and may be due to white pulp red pulp differentiation. Adrenals/Urinary Tract: Adrenal glands and kidneys are unremarkable. There is a simple right renal cyst noted. No hydronephrosis. The bladder is unremarkable. Stomach/Bowel: The stomach, duodenum, small bowel and colon are grossly normal without oral contrast. No inflammatory changes, mass lesions or obstructive findings. The terminal ileum and appendix are normal. Scattered colonic diverticulosis but no findings for acute diverticulitis. Vascular/Lymphatic: Scattered aortic calcifications without aneurysm or dissection. The branch vessels are patent. The major venous structures are patent. No mesenteric or retroperitoneal mass or adenopathy. Reproductive: The uterus and ovaries are unremarkable. Other: No pelvic mass or pelvic adenopathy. No free pelvic fluid collections. No inguinal mass or  adenopathy. Musculoskeletal: L3 hemangioma noted. IMPRESSION: 1. Intra and extrahepatic biliary dilatation without obvious cause. No definite common bile duct stone, ampullary lesion or pancreatic head mass. Post cholecystectomy dilatation is unlikely given the recent time course of the cholecystectomy. A distal stricture is possible. Recommend ERCP or MRCP (without and with contrast) for further evaluation. 2. No hepatic lesions. 3. No abdominal or pelvic mass or adenopathy. 4. Moderate scattered colonic diverticulosis but no findings for acute diverticulitis. Electronically Signed   By: Marijo Sanes M.D.   On: 09/20/2019 14:30    EKG: Independently reviewed. NSR, normal  Assessment/Plan Active Problems:  Biliary obstruction   Pancreatitis  (please populate well all problems here in Problem List. (For example, if patient is on BP meds at home and you resume or decide to hold them, it is a problem that needs to be her. Same for CAD, COPD, HLD and so on)   1. Biliary obstruction with pancreatitis - no visible stone on CT, no mass. Question of retained CBD stone vs stricture. Patient reports a GB remant was tacked up at surgery. Plan Med/surg admit ` GI consult - EDP was going to call: MRCP has been ordered  IVF  NPO except sips,chips,meds  Ketorolac 15 mg IV q 6     DVT prophylaxis: lovenox (Lovenox/Heparin/SCD's/anticoagulated/None (if comfort care) Code Status: full code (Full/Partial (specify details) Family Communication: spoke with husband - agreed to treatment plan (Specify name, relationship. Do not write "discussed with patient". Specify tel # if discussed over the phone) Disposition Plan: home when stable (specify when and where you expect patient to be discharged) Consults called: GI - EDP was to have called. If not - [facility] (with names) Admission status: in-patient (inpatient / obs / tele / medical floor / SDU)   Illene Regulus MD [facility] Pager [phone]   If 7PM-7AM, please contact night-coverage [email or web address] Password [facility]  09/21/2019, 6:48 PM

## 2019-09-21 NOTE — ED Notes (Signed)
MRI contacted about pt MRI. MRI notified this writer the patient will be going next.

## 2019-09-21 NOTE — ED Provider Notes (Signed)
[facility] EMERGENCY DEPARTMENT Provider Note   CSN: [id number] Arrival date & time: 09/21/19  1113     History   Chief Complaint Chief Complaint  Patient presents with   Jaundice   Abnormal Lab    HPI [name] Young is a 68 y.o. female.     Patient c/o 1 month hx ruq and epigastric abdominal pain. Symptoms gradual onset, dull,  mod-severe, constant, persistent, radiating to back, slowly worse. Nausea, no vomiting or diarrhea. No fever or chills. Saw pcp yesterday, labs today, and told abnormal labs and to go to ED. No wt loss. No fever or chills. Hx cholecystecomy 02/2019.   The history is provided by the patient.  Abnormal Lab   Past Medical History:  Diagnosis Date   Allergy    Anemia    Asthma    Bronchitis    Hyperlipidemia     Patient Active Problem List   Diagnosis Date Noted   Cholecystitis with cholelithiasis 02/24/2019   Seasonal allergies 10/30/2012    Past Surgical History:  Procedure Laterality Date   CESAREAN SECTION     CHOLECYSTECTOMY N/A 02/25/2019   Procedure: LAPAROSCOPIC CHOLECYSTECTOMY;  Surgeon: Berna Bue, MD;  Location: [facility];  Service: General;  Laterality: N/A;   TUBAL LIGATION       OB History   No obstetric history on file.      Home Medications    Prior to Admission medications   Medication Sig Start Date End Date Taking? Authorizing Provider  acetaminophen (TYLENOL) 500 MG tablet Take 2 tablets (1,000 mg total) by mouth every 8 (eight) hours as needed for mild pain. 02/26/19   Rayburn, Alphonsus Sias, PA-C  celecoxib (CELEBREX) 200 MG capsule Take 200 mg by mouth 2 (two) times daily. 07/20/19   [provider]  HYDROcodone-acetaminophen (NORCO/VICODIN) 5-325 MG tablet Take 1 tablet by mouth every 4 (four) hours as needed. 09/04/19   Ward, Layla Maw, DO  metroNIDAZOLE (METROGEL) 0.75 % gel Apply 1 application topically 2 (two) times daily.    [provider]  ondansetron (ZOFRAN  ODT) 4 MG disintegrating tablet Take 1 tablet (4 mg total) by mouth every 6 (six) hours as needed. 09/04/19   Ward, Layla Maw, DO  pantoprazole (PROTONIX) 40 MG tablet Take 1 tablet (40 mg total) by mouth daily. 09/04/19   Ward, Layla Maw, DO  sucralfate (CARAFATE) 1 GM/10ML suspension Take 10 mLs (1 g total) by mouth 4 (four) times daily -  with meals and at bedtime. 09/04/19   Ward, Layla Maw, DO    Family History Family History  Problem Relation Age of Onset   Diabetes Paternal Grandmother    Diabetes Paternal Grandfather     Social History Social History   Tobacco Use   Smoking status: Never Smoker   Smokeless tobacco: Never Used  Substance Use Topics   Alcohol use: No    Alcohol/week: 0.0 standard drinks   Drug use: No     Allergies   Codeine, Latex, Mobic [meloxicam], and Tape   Review of Systems Review of Systems  Constitutional: Negative for chills and fever.  HENT: Negative for sore throat.   Eyes: Negative for redness.  Respiratory: Negative for shortness of breath.   Cardiovascular: Negative for chest pain.  Gastrointestinal: Positive for abdominal pain and nausea.  Endocrine: Negative for polyuria.  Genitourinary: Negative for dysuria and flank pain.  Musculoskeletal: Negative for back pain and neck pain.  Skin: Negative for rash.  Neurological:  Negative for headaches.  Hematological: Does not bruise/bleed easily.  Psychiatric/Behavioral: Negative for agitation.     Physical Exam Updated Vital Signs BP 125/76 (BP Location: Left Arm)    Pulse 60    Temp 97.7 F (36.5 C) (Oral)    Resp 16    Ht 1.575 m ( )    Wt 77 kg    SpO2 96%    BMI 31.05 kg/m   Physical Exam Vitals signs and nursing note reviewed.  Constitutional:      Appearance: Normal appearance. She is well-developed.  HENT:     Head: Atraumatic.     Nose: Nose normal.     Mouth/Throat:     Mouth: Mucous membranes are moist.  Eyes:     General: Scleral icterus present.      Conjunctiva/sclera: Conjunctivae normal.  Neck:     Musculoskeletal: Normal range of motion and neck supple. No neck rigidity or muscular tenderness.     Trachea: No tracheal deviation.  Cardiovascular:     Rate and Rhythm: Normal rate and regular rhythm.     Pulses: Normal pulses.     Heart sounds: Normal heart sounds. No murmur. No friction rub. No gallop.   Pulmonary:     Effort: Pulmonary effort is normal. No respiratory distress.     Breath sounds: Normal breath sounds.  Abdominal:     General: Bowel sounds are normal. There is no distension.     Palpations: Abdomen is soft.     Tenderness: There is abdominal tenderness. There is no guarding.     Comments: Epigastric and ruq tenderness.   Genitourinary:    Comments: No cva tenderness.  Musculoskeletal:        General: No swelling.  Skin:    General: Skin is warm and dry.     Findings: No rash.  Neurological:     Mental Status: She is alert.     Comments: Alert, speech normal. Steady gait.   Psychiatric:        Mood and Affect: Mood normal.      ED Treatments / Results  Labs (all labs ordered are listed, but only abnormal results are displayed) Results for orders placed or performed during the hospital encounter of 09/21/19  Lipase, blood  Result Value Ref Range   Lipase 3,204 (H) 11 - 51 U/L  Comprehensive metabolic panel  Result Value Ref Range   Sodium 139 135 - 145 mmol/L   Potassium 3.5 3.5 - 5.1 mmol/L   Chloride 101 98 - 111 mmol/L   CO2 25 22 - 32 mmol/L   Glucose, Bld 121 (H) 70 - 99 mg/dL   BUN 14 8 - 23 mg/dL   Creatinine, Ser 1.61 0.44 - 1.00 mg/dL   Calcium 9.7 8.9 - 09.6 mg/dL   Total Protein 7.4 6.5 - 8.1 g/dL   Albumin 4.1 3.5 - 5.0 g/dL   AST 045 (H) 15 - 41 U/L   ALT 245 (H) 0 - 44 U/L   Alkaline Phosphatase 332 (H) 38 - 126 U/L   Total Bilirubin 9.9 (H) 0.3 - 1.2 mg/dL   GFR calc non Af Amer >60 >60 mL/min   GFR calc Af Amer >60 >60 mL/min   Anion gap 13 5 - 15  CBC  Result Value Ref  Range   WBC 6.3 4.0 - 10.5 K/uL   RBC 4.87 3.87 - 5.11 MIL/uL   Hemoglobin 14.8 12.0 - 15.0 g/dL   HCT 40.9 81.1 -  46.0 %   MCV 90.6 80.0 - 100.0 fL   MCH 30.4 26.0 - 34.0 pg   MCHC 33.6 30.0 - 36.0 g/dL   RDW 13.0 11.5 - 15.5 %   Platelets 306 150 - 400 K/uL   nRBC 0.0 0.0 - 0.2 %  Urinalysis, Routine w reflex microscopic  Result Value Ref Range   Color, Urine AMBER (A) YELLOW   APPearance TURBID (A) CLEAR   Specific Gravity, Urine >1.030 (H) 1.005 - 1.030   pH 5.0 5.0 - 8.0   Glucose, UA 100 (A) NEGATIVE mg/dL   Hgb urine dipstick SMALL (A) NEGATIVE   Bilirubin Urine LARGE (A) NEGATIVE   Ketones, ur 15 (A) NEGATIVE mg/dL   Protein, ur 100 (A) NEGATIVE mg/dL   Nitrite POSITIVE (A) NEGATIVE   Leukocytes,Ua TRACE (A) NEGATIVE  Urinalysis, Microscopic (reflex)  Result Value Ref Range   RBC / HPF 0-5 0 - 5 RBC/hpf   WBC, UA 6-10 0 - 5 WBC/hpf   Bacteria, UA FEW (A) NONE SEEN   Squamous Epithelial / LPF 0-5 0 - 5   Urine-Other AMORPHOUS URATES/PHOSPHATES    Dg Chest 2 View  Result Date: 09/04/2019 CLINICAL DATA:  Chest and epigastric pain. Shortness of breath. EXAM: CHEST - 2 VIEW COMPARISON:  12/22/2015 FINDINGS: The cardiomediastinal contours are normal. Bronchial thickening, new from prior. Mild hyperinflation. Pulmonary vasculature is normal. No consolidation, pleural effusion, or pneumothorax. No acute osseous abnormalities are seen. IMPRESSION: Bronchial thickening and mild hyperinflation, new from prior, may be bronchitis or asthma. Electronically Signed   By: Keith Rake M.D.   On: 09/04/2019 00:40   Ct Abdomen Pelvis W Contrast  Result Date: 09/20/2019 CLINICAL DATA:  Burning sensation in the abdomen with increased liver function studies and elevated bilirubin. Possible obstructive jaundice. EXAM: CT ABDOMEN AND PELVIS WITH CONTRAST TECHNIQUE: Multidetector CT imaging of the abdomen and pelvis was performed using the standard protocol following bolus administration of  intravenous contrast. CONTRAST:  158mL ISOVUE-300 IOPAMIDOL (ISOVUE-300) INJECTION 61% COMPARISON:  None. FINDINGS: Lower chest: The lung bases are clear of an acute process. No pleural effusions are identified. The heart is normal in size. No pericardial effusion. Hepatobiliary: No focal hepatic lesions are identified. There is intra and extrahepatic biliary dilatation. The common bile duct in the porta hepatis measures a maximum of 13 mm and measures a maximum of 9.5 mm in the head of the pancreas. It does taper down to the ampulla. There could be a distal stricture. I do not see any definite obstructing common bile duct stones and no obvious ampullary lesion or pancreatic head mass. The gallbladder is surgically absent. History of cholecystectomy in May 2020. Pancreas: No mass or inflammation. Normal caliber and course of the main pancreatic duct. The distal duct is slightly dilated measuring 4 mm in the head of the pancreas. Spleen: Normal size. Small scattered lesions on the portal venous phase images are not persistent on the delayed images and may be due to white pulp red pulp differentiation. Adrenals/Urinary Tract: Adrenal glands and kidneys are unremarkable. There is a simple right renal cyst noted. No hydronephrosis. The bladder is unremarkable. Stomach/Bowel: The stomach, duodenum, small bowel and colon are grossly normal without oral contrast. No inflammatory changes, mass lesions or obstructive findings. The terminal ileum and appendix are normal. Scattered colonic diverticulosis but no findings for acute diverticulitis. Vascular/Lymphatic: Scattered aortic calcifications without aneurysm or dissection. The branch vessels are patent. The major venous structures are patent. No mesenteric or  retroperitoneal mass or adenopathy. Reproductive: The uterus and ovaries are unremarkable. Other: No pelvic mass or pelvic adenopathy. No free pelvic fluid collections. No inguinal mass or adenopathy.  Musculoskeletal: L3 hemangioma noted. IMPRESSION: 1. Intra and extrahepatic biliary dilatation without obvious cause. No definite common bile duct stone, ampullary lesion or pancreatic head mass. Post cholecystectomy dilatation is unlikely given the recent time course of the cholecystectomy. A distal stricture is possible. Recommend ERCP or MRCP (without and with contrast) for further evaluation. 2. No hepatic lesions. 3. No abdominal or pelvic mass or adenopathy. 4. Moderate scattered colonic diverticulosis but no findings for acute diverticulitis. Electronically Signed   By: Rudie Meyer M.D.   On: 09/20/2019 14:30    EKG None  Radiology Ct Abdomen Pelvis W Contrast  Result Date: 09/20/2019 CLINICAL DATA:  Burning sensation in the abdomen with increased liver function studies and elevated bilirubin. Possible obstructive jaundice. EXAM: CT ABDOMEN AND PELVIS WITH CONTRAST TECHNIQUE: Multidetector CT imaging of the abdomen and pelvis was performed using the standard protocol following bolus administration of intravenous contrast. CONTRAST:  ISOVUE-300 IOPAMIDOL (ISOVUE-300) INJECTION 61% COMPARISON:  None. FINDINGS: Lower chest: The lung bases are clear of an acute process. No pleural effusions are identified. The heart is normal in size. No pericardial effusion. Hepatobiliary: No focal hepatic lesions are identified. There is intra and extrahepatic biliary dilatation. The common bile duct in the porta hepatis measures a maximum of 13 mm and measures a maximum of 9.5 mm in the head of the pancreas. It does taper down to the ampulla. There could be a distal stricture. I do not see any definite obstructing common bile duct stones and no obvious ampullary lesion or pancreatic head mass. The gallbladder is surgically absent. History of cholecystectomy in May 2020. Pancreas: No mass or inflammation. Normal caliber and course of the main pancreatic duct. The distal duct is slightly dilated measuring 4 mm  in the head of the pancreas. Spleen: Normal size. Small scattered lesions on the portal venous phase images are not persistent on the delayed images and may be due to white pulp red pulp differentiation. Adrenals/Urinary Tract: Adrenal glands and kidneys are unremarkable. There is a simple right renal cyst noted. No hydronephrosis. The bladder is unremarkable. Stomach/Bowel: The stomach, duodenum, small bowel and colon are grossly normal without oral contrast. No inflammatory changes, mass lesions or obstructive findings. The terminal ileum and appendix are normal. Scattered colonic diverticulosis but no findings for acute diverticulitis. Vascular/Lymphatic: Scattered aortic calcifications without aneurysm or dissection. The branch vessels are patent. The major venous structures are patent. No mesenteric or retroperitoneal mass or adenopathy. Reproductive: The uterus and ovaries are unremarkable. Other: No pelvic mass or pelvic adenopathy. No free pelvic fluid collections. No inguinal mass or adenopathy. Musculoskeletal: L3 hemangioma noted. IMPRESSION: 1. Intra and extrahepatic biliary dilatation without obvious cause. No definite common bile duct stone, ampullary lesion or pancreatic head mass. Post cholecystectomy dilatation is unlikely given the recent time course of the cholecystectomy. A distal stricture is possible. Recommend ERCP or MRCP (without and with contrast) for further evaluation. 2. No hepatic lesions. 3. No abdominal or pelvic mass or adenopathy. 4. Moderate scattered colonic diverticulosis but no findings for acute diverticulitis. Electronically Signed   By: Rudie Meyer M.D.   On: 09/20/2019 14:30    Procedures Procedures (including critical care time)  Medications Ordered in ED Medications  sodium chloride 0.9 % bolus 500 mL (has no administration in time range)  HYDROmorphone (DILAUDID) injection  0.5 mg (has no administration in time range)  ondansetron (ZOFRAN) injection 4 mg (has  no administration in time range)     Initial Impression / Assessment and Plan / ED Course  I have reviewed the triage vital signs and the nursing notes.  Pertinent labs & imaging results that were available during my care of the patient were reviewed by me and considered in my medical decision making (see chart for details).  Iv ns bolus. zofran iv. Dilaudid iv. Labs sent.   Reviewed nursing notes and prior charts for additional history. CT yesterday with biliary obstruction, no definite mass or CBD stone noted on CT.   Labs reviewed/interpreted by me - marked elevation of lipase and lfts.   Hospitalists consulted for admission.  Patient denies having gi doc - gi on call consulted for possible ERCP.  Discussed with Gi on call - they request order for MRCP and they will see/consult. MRCP ordered.   Recheck pt, pain improved. Awaiting admission.    Final Clinical Impressions(s) / ED Diagnoses   Final diagnoses:  Biliary obstruction  RUQ abdominal pain  Acute biliary pancreatitis without infection or necrosis    ED Discharge Orders    None       Cathren LaineSteinl, Danyell Awbrey, MD 09/21/19 1617

## 2019-09-21 NOTE — ED Triage Notes (Signed)
Pt reports having emergency gall bladder surgery in May. Sent by PCP for abnormal labs. Reports intermittent upper abd pain.

## 2019-09-21 NOTE — ED Notes (Signed)
Pt slightly confused with calm affect.  Has been drinking fluids on arrival and instructed NPO at this time for MRCP in approx 4-6 hours.  Denies any pain at this time or need for pain meds.

## 2019-09-22 ENCOUNTER — Inpatient Hospital Stay (HOSPITAL_COMMUNITY): Payer: Medicare PPO

## 2019-09-22 ENCOUNTER — Encounter (HOSPITAL_COMMUNITY): Payer: Self-pay | Admitting: *Deleted

## 2019-09-22 DIAGNOSIS — R1011 Right upper quadrant pain: Secondary | ICD-10-CM

## 2019-09-22 LAB — CBC WITH DIFFERENTIAL/PLATELET
Abs Immature Granulocytes: 0.01 10*3/uL (ref 0.00–0.07)
Basophils Absolute: 0 10*3/uL (ref 0.0–0.1)
Basophils Relative: 1 %
Eosinophils Absolute: 0.2 10*3/uL (ref 0.0–0.5)
Eosinophils Relative: 5 %
HCT: 40.5 % (ref 36.0–46.0)
Hemoglobin: 13.7 g/dL (ref 12.0–15.0)
Immature Granulocytes: 0 %
Lymphocytes Relative: 27 %
Lymphs Abs: 1.1 10*3/uL (ref 0.7–4.0)
MCH: 31.1 pg (ref 26.0–34.0)
MCHC: 33.8 g/dL (ref 30.0–36.0)
MCV: 91.8 fL (ref 80.0–100.0)
Monocytes Absolute: 0.3 10*3/uL (ref 0.1–1.0)
Monocytes Relative: 7 %
Neutro Abs: 2.6 10*3/uL (ref 1.7–7.7)
Neutrophils Relative %: 60 %
Platelets: 244 10*3/uL (ref 150–400)
RBC: 4.41 MIL/uL (ref 3.87–5.11)
RDW: 13.2 % (ref 11.5–15.5)
WBC: 4.2 10*3/uL (ref 4.0–10.5)
nRBC: 0 % (ref 0.0–0.2)

## 2019-09-22 LAB — SURGICAL PCR SCREEN
MRSA, PCR: POSITIVE — AB
Staphylococcus aureus: POSITIVE — AB

## 2019-09-22 LAB — COMPREHENSIVE METABOLIC PANEL
ALT: 193 U/L — ABNORMAL HIGH (ref 0–44)
AST: 107 U/L — ABNORMAL HIGH (ref 15–41)
Albumin: 3.4 g/dL — ABNORMAL LOW (ref 3.5–5.0)
Alkaline Phosphatase: 291 U/L — ABNORMAL HIGH (ref 38–126)
Anion gap: 12 (ref 5–15)
BUN: 13 mg/dL (ref 8–23)
CO2: 24 mmol/L (ref 22–32)
Calcium: 9.5 mg/dL (ref 8.9–10.3)
Chloride: 106 mmol/L (ref 98–111)
Creatinine, Ser: 0.78 mg/dL (ref 0.44–1.00)
GFR calc Af Amer: >60 mL/min (ref >60)
GFR calc non Af Amer: >60 mL/min (ref >60)
Glucose, Bld: 103 mg/dL — ABNORMAL HIGH (ref 70–99)
Potassium: 3.5 mmol/L (ref 3.5–5.1)
Sodium: 142 mmol/L (ref 135–145)
Total Bilirubin: 7.3 mg/dL — ABNORMAL HIGH (ref 0.3–1.2)
Total Protein: 6.4 g/dL — ABNORMAL LOW (ref 6.5–8.1)

## 2019-09-22 LAB — LIPASE, BLOOD: Lipase: 384 U/L — ABNORMAL HIGH (ref 11–51)

## 2019-09-22 MED ORDER — GADOBUTROL 1 MMOL/ML IV SOLN
7.7000 mL | Freq: Once | INTRAVENOUS | Status: AC | PRN
  Administered 2019-09-22: 02:00:00 7.7 mL via INTRAVENOUS

## 2019-09-22 MED ORDER — MUPIROCIN 2 % EX OINT
1.0000 "application " | TOPICAL_OINTMENT | Freq: Two times a day (BID) | CUTANEOUS | Status: DC
  Administered 2019-09-22 – 2019-09-24 (×4): 1 via NASAL
  Filled 2019-09-22 (×2): qty 22

## 2019-09-22 MED ORDER — HYDROMORPHONE HCL 1 MG/ML IJ SOLN: 1.0000 mg | INTRAMUSCULAR | Status: DC | PRN

## 2019-09-22 MED ORDER — PIPERACILLIN-TAZOBACTAM 3.375 G IVPB 30 MIN
3.3750 g | Freq: Once | INTRAVENOUS | Status: AC
  Administered 2019-09-22: 3.375 g via INTRAVENOUS
  Filled 2019-09-22: qty 50

## 2019-09-22 MED ORDER — SODIUM CHLORIDE 0.9 % IV SOLN
INTRAVENOUS | Status: DC
  Administered 2019-09-22 – 2019-09-23 (×2): via INTRAVENOUS

## 2019-09-22 MED ORDER — PIPERACILLIN-TAZOBACTAM 3.375 G IVPB
3.3750 g | Freq: Three times a day (TID) | INTRAVENOUS | Status: DC
  Administered 2019-09-22 – 2019-09-24 (×5): 3.375 g via INTRAVENOUS
  Filled 2019-09-22 (×7): qty 50

## 2019-09-22 MED ORDER — IBUPROFEN 200 MG PO TABS
200.0000 mg | ORAL_TABLET | Freq: Four times a day (QID) | ORAL | Status: DC | PRN
  Administered 2019-09-22: 17:00:00 200 mg via ORAL
  Filled 2019-09-22: qty 1

## 2019-09-22 NOTE — ED Notes (Signed)
Patient transported to MRI 

## 2019-09-22 NOTE — ED Notes (Signed)
Attempted to call report x 1  

## 2019-09-22 NOTE — ED Notes (Signed)
Sitting up in bed typing on her cell phone.

## 2019-09-22 NOTE — Progress Notes (Signed)
PROGRESS NOTE    [name] Young  XQJ:[id number] DOB: [birth year] DOA: 09/21/2019 PCP: Seward Gilbert, MD  Brief Narrative: 68 year old female with history of asthma, bronchitis, dyslipidemia, endometriosis underwent laparoscopic cholecystectomy in May subsequently she had intermittent episodes of upper abdominal pain presented to the emergency room 12/8 with dull constant persistent epigastric and right-sided abdominal pain radiating to the back, in the ED she was noted to have abnormal LFTs, lipase of 3000, CT abdomen pelvis noted biliary dilation   Assessment & Plan:   Gallstone pancreatitis/choledocholithiasis -Status post lap cholecystectomy in 02/2019 -MRCP notes retained CBD stone near ampulla causing obstruction, risk of cholangitis, will order Zosyn today -Continue n.p.o., increase IV fluids to saline at 100 cc an hour -Pain control, antiemetics -[facility] consulted for ERCP  GERD -Continue PPI  DVT prophylaxis: Lovenox Code Status: Full code Family Communication: No family at bedside Disposition Plan: Home pending above work-up  Consultants:  [facility]  Procedures:   Antimicrobials:    Subjective: -Continues to have abdominal discomfort but pain medicines are helping some, no nausea vomiting  Objective: Vitals:   09/22/19 0430 09/22/19 0556 09/22/19 0600 09/22/19 1244  BP: 111/70 113/63 124/66 138/78  Pulse: (!) 57 (!) 54 (!) 41 65  Resp: 18 14 17 18   Temp:    98.1 F (36.7 C)  TempSrc:    Oral  SpO2: 94% 96% 95% 95%  Weight:      Height:       No intake or output data in the 24 hours ending 09/22/19 1432 Filed Weights   09/21/19 1156  Weight: 77 kg    Examination:  General exam: Pleasant obese female laying in bed, mildly uncomfortable appearing, AAO x3 respiratory system: Clear to auscultation. Cardiovascular system: S1 & S2 heard, RRR.   Gastrointestinal system: Abdomen is soft, mild epigastric and right upper quadrant  tenderness, bowel sounds present no rigidity or guarding Central nervous system: Alert and oriented. No focal neurological deficits. Extremities: No edema Skin: No rashes, lesions or ulcers Psychiatry: Judgement and insight appear normal. Mood & affect appropriate.     Data Reviewed:   CBC: Recent Labs  Lab 09/20/19 1808 09/21/19 1206 09/22/19 0949  WBC 6.7 6.3 4.2  NEUTROABS  --   --  2.6  HGB 14.6 14.8 13.7  HCT 42.2 44.1 40.5  MCV 89.6 90.6 91.8  PLT 275 306 448   Basic Metabolic Panel: Recent Labs  Lab 09/20/19 1808 09/21/19 1206 09/22/19 0949  NA 140 139 142  K 3.1* 3.5 3.5  CL 103 101 106  CO2 25 25 24   GLUCOSE 97 121* 103*  BUN 10 14 13   CREATININE 0.86 0.82 0.78  CALCIUM 9.8 9.7 9.5   GFR: Estimated Creatinine Clearance: 66.5 mL/min (by C-G formula based on SCr of 0.78 mg/dL). Liver Function Tests: Recent Labs  Lab 09/20/19 1808 09/21/19 1206 09/22/19 0949  AST 135* 144* 107*  ALT 255* 245* 193*  ALKPHOS 302* 332* 291*  BILITOT 9.1* 9.9* 7.3*  PROT 7.4 7.4 6.4*  ALBUMIN 3.8 4.1 3.4*   Recent Labs  Lab 09/20/19 1808 09/21/19 1206 09/22/19 0949  LIPASE 2,976* 3,204* 384*   No results for input(s): AMMONIA in the last 168 hours. Coagulation Profile: No results for input(s): INR, PROTIME in the last 168 hours. Cardiac Enzymes: No results for input(s): CKTOTAL, CKMB, CKMBINDEX, TROPONINI in the last 168 hours. BNP (last 3 results) No results for input(s): PROBNP in the last 8760 hours. HbA1C: No results for input(s):  HGBA1C in the last 72 hours. CBG: No results for input(s): GLUCAP in the last 168 hours. Lipid Profile: No results for input(s): CHOL, HDL, LDLCALC, TRIG, CHOLHDL, LDLDIRECT in the last 72 hours. Thyroid Function Tests: No results for input(s): TSH, T4TOTAL, FREET4, T3FREE, THYROIDAB in the last 72 hours. Anemia Panel: No results for input(s): VITAMINB12, FOLATE, FERRITIN, TIBC, IRON, RETICCTPCT in the last 72 hours. Urine  analysis:    Component Value Date/Time   COLORURINE AMBER (A) 09/21/2019 1159   APPEARANCEUR TURBID (A) 09/21/2019 1159   LABSPEC >1.030 (H) 09/21/2019 1159   PHURINE 5.0 09/21/2019 1159   GLUCOSEU 100 (A) 09/21/2019 1159   HGBUR SMALL (A) 09/21/2019 1159   BILIRUBINUR LARGE (A) 09/21/2019 1159   KETONESUR 15 (A) 09/21/2019 1159   PROTEINUR 100 (A) 09/21/2019 1159   UROBILINOGEN 1.0 07/29/2015 0209   NITRITE POSITIVE (A) 09/21/2019 1159   LEUKOCYTESUR TRACE (A) 09/21/2019 1159   Sepsis Labs: @LABRCNTIP (procalcitonin:4,lacticidven:4)  ) Recent Results (from the past 240 hour(s))  Respiratory Panel by RT PCR (Flu A&B, Covid) - Nasopharyngeal Swab     Status: None   Collection Time: 09/21/19  5:32 PM   Specimen: Nasopharyngeal Swab  Result Value Ref Range Status   SARS Coronavirus 2 by RT PCR NEGATIVE NEGATIVE Final    Comment: (NOTE) SARS-CoV-2 target nucleic acids are NOT DETECTED. The SARS-CoV-2 RNA is generally detectable in upper respiratoy specimens during the acute phase of infection. The lowest concentration of SARS-CoV-2 viral copies this assay can detect is 131 copies/mL. A negative result does not preclude SARS-Cov-2 infection and should not be used as the sole basis for treatment or other patient management decisions. A negative result may occur with  improper specimen collection/handling, submission of specimen other than nasopharyngeal swab, presence of viral mutation(s) within the areas targeted by this assay, and inadequate number of viral copies (<131 copies/mL). A negative result must be combined with clinical observations, patient history, and epidemiological information. The expected result is Negative. Fact Sheet for Patients:  14/08/20 Fact Sheet for Healthcare Providers:  https://www.moore.com/ This test is not yet ap proved or cleared by the https://www.young.biz/ FDA and  has been authorized for detection  and/or diagnosis of SARS-CoV-2 by FDA under an Emergency Use Authorization (EUA). This EUA will remain  in effect (meaning this test can be used) for the duration of the COVID-19 declaration under Section 564(b)(1) of the Act, 21 U.S.C. section 360bbb-3(b)(1), unless the authorization is terminated or revoked sooner.    Influenza A by PCR NEGATIVE NEGATIVE Final   Influenza B by PCR NEGATIVE NEGATIVE Final    Comment: (NOTE) The Xpert Xpress SARS-CoV-2/FLU/RSV assay is intended as an aid in  the diagnosis of influenza from Nasopharyngeal swab specimens and  should not be used as a sole basis for treatment. Nasal washings and  aspirates are unacceptable for Xpert Xpress SARS-CoV-2/FLU/RSV  testing. Fact Sheet for Patients: [place] Fact Sheet for Healthcare Providers: https://www.moore.com/ This test is not yet approved or cleared by the https://www.young.biz/ FDA and  has been authorized for detection and/or diagnosis of SARS-CoV-2 by  FDA under an Emergency Use Authorization (EUA). This EUA will remain  in effect (meaning this test can be used) for the duration of the  Covid-19 declaration under Section 564(b)(1) of the Act, 21  U.S.C. section 360bbb-3(b)(1), unless the authorization is  terminated or revoked. Performed at [facility], [address] [place], [place]  Radiology Studies: Mr Abdomen Mrcp Vivien RossettiW Wo Contast  Result Date: 09/22/2019 CLINICAL DATA:  Abnormal liver function tests. Pancreatitis. Possible biliary obstruction. EXAM: MRI ABDOMEN WITHOUT AND WITH CONTRAST (INCLUDING MRCP) TECHNIQUE: Multiplanar multisequence MR imaging of the abdomen was performed both before and after the administration of intravenous contrast. Heavily T2-weighted images of the biliary and pancreatic ducts were obtained, and three-dimensional MRCP images were rendered by post processing. CONTRAST:  7.707mL GADAVIST GADOBUTROL 1  MMOL/ML IV SOLN COMPARISON:  CT abdomen 09/20/2019 FINDINGS: Lower chest: Unremarkable Hepatobiliary: Prior cholecystectomy. Common bile duct measures up to 1.3 cm in diameter there is mild to moderate intrahepatic biliary dilatation. 4 mm distal CBD stone near the ampulla on image 10/22 is the likely cause. Subtle accentuated enhancement of the walls of the distal CBD. No significant focal hepatic lesion is identified. Although partially obscured by adjacent metal artifact from cholecystectomy clips, the complex appearance on image 18/4 raises suspicion for additional small stones in the cystic duct remnant. Pancreas: Subtle pancreatic/peripancreatic edema may reflect acute pancreatitis. No abscess, pseudocyst, pancreatic necrosis identified. Spleen: Several indistinctly marginated hypoechoic lesions are present in the spleen on arterial phase images, such as the 1.7 by 1.1 cm anterior splenic lesion on image 15/31. 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 Adrenals/Urinary Tract: Dominant Bosniak category 1 2.8 cm nonenhancing fluid signal intensity lesion compatible with cyst. Two additional small T2 hyperintense lesions are present in the right mid kidney on image 18/4 and are likewise probably cysts. Adrenal glands unremarkable. Stomach/Bowel: Descending colon diverticulosis. Vascular/Lymphatic:  Unremarkable Other:  No supplemental non-categorized findings. Musculoskeletal: Small hemangioma eccentric to the left in the L3 vertebral body. IMPRESSION: 1. 4 mm distal CBD stone near the ampulla, causing mild to moderate intrahepatic and extrahepatic biliary dilatation. Probable additional small stones in the cystic duct remnant for example on image 18/4. 2. Subtle pancreatic/peripancreatic edema may reflect acute pancreatitis. No abscess, pseudocyst, or pancreatic necrosis identified. 3. Bosniak category 1 cyst of the left kidney. 4. Descending colon  diverticulosis. Electronically Signed   By: Gaylyn RongWalter  Liebkemann M.D.   On: 09/22/2019 07:42        Scheduled Meds: . enoxaparin (LOVENOX) injection  40 mg Subcutaneous Q24H  .  HYDROmorphone (DILAUDID) injection  0.5 mg Intravenous Once  . ketorolac  15 mg Intravenous Q6H  . ondansetron (ZOFRAN) IV  4 mg Intravenous Once  . pantoprazole  40 mg Oral Daily  . senna  1 tablet Oral BID  . sucralfate  1 g Oral TID WC & HS  . traZODone  25 mg Oral QHS   Continuous Infusions: . dextrose 5 % and 0.45% NaCl 50 mL/hr at 09/21/19 2005     LOS: 1 day    Time spent: 35min    Zannie CovePreetha Deaken Jurgens, MD [facility] Page via [email or web address], password [email or web address] After 7PM please contact night-coverage  09/22/2019, 2:32 PM

## 2019-09-22 NOTE — Anesthesia Preprocedure Evaluation (Addendum)
Anesthesia Evaluation  Patient identified by MRN, date of birth, ID band Patient awake    Reviewed: Allergy & Precautions, NPO status , Patient's Chart, lab work & pertinent test results  Airway Mallampati: I  TM Distance: >3 FB Neck ROM: Full    Dental no notable dental hx. (+) Teeth Intact, Dental Advisory Given   Pulmonary asthma ,    Pulmonary exam normal breath sounds clear to auscultation       Cardiovascular Exercise Tolerance: Good Normal cardiovascular exam Rhythm:Regular Rate:Normal     Neuro/Psych negative neurological ROS  negative psych ROS   GI/Hepatic negative GI ROS, Neg liver ROS,   Endo/Other  negative endocrine ROS  Renal/GU K+ 3.5 Cr 0.78     Musculoskeletal negative musculoskeletal ROS (+)   Abdominal (+) + obese,   Peds  Hematology  (+) anemia , Hgb 13.7 plt 244   Anesthesia Other Findings All: Latex, codeine Jaundiced   Reproductive/Obstetrics                            Anesthesia Physical Anesthesia Plan  ASA: II  Anesthesia Plan: General   Post-op Pain Management:    Induction: Intravenous  PONV Risk Score and Plan: 3 and Treatment may vary due to age or medical condition and Ondansetron  Airway Management Planned: Oral ETT  Additional Equipment: None  Intra-op Plan:   Post-operative Plan: Extubation in OR  Informed Consent: I have reviewed the patients History and Physical, chart, labs and discussed the procedure including the risks, benefits and alternatives for the proposed anesthesia with the patient or authorized representative who has indicated his/her understanding and acceptance.     Dental advisory given  Plan Discussed with:   Anesthesia Plan Comments:        Anesthesia Quick Evaluation

## 2019-09-22 NOTE — ED Notes (Signed)
Pt being transported to [facility] via bed - Pt verified she has all of her belongings.

## 2019-09-22 NOTE — ED Notes (Signed)
MD in w pt

## 2019-09-22 NOTE — Consult Note (Signed)
Reason for Consult: Gallstone pancreatitis positive MRCP Referring Physician: Hospital team  [name] Young is an 68 y.o. female.  HPI: Patient seen and examined in her hospital computer chart reviewed and she has been sick since her admission for cholecystectomy in May with frequent upper abdominal pain after she eats and she has been to the ER 2 other times but has not seen a GI doctor and does not remember discussing it with her surgeon but believes she may have had a colonoscopy at some point in the past and her family history is negative for any GI issues pancreatitis etc. and she has no other complaints Past Medical History:  Diagnosis Date  . Allergy   . Anemia   . Asthma   . Bronchitis   . Endometriosis   . Hyperlipidemia     Past Surgical History:  Procedure Laterality Date  . CESAREAN SECTION    . CHOLECYSTECTOMY N/A 02/25/2019   Procedure: LAPAROSCOPIC CHOLECYSTECTOMY;  Surgeon: Clovis Riley, MD;  Location: [facility];  Service: General;  Laterality: N/A;  . laproscopic resection ovrian cyst    . TUBAL LIGATION      Family History  Problem Relation Age of Onset  . Arthritis/Rheumatoid Mother   . Osteoarthritis Father   . Lung cancer Father   . Diabetes Paternal Grandmother   . Diabetes Paternal Grandfather     Social History:  reports that she has never smoked. She has never used smokeless tobacco. She reports that she does not drink alcohol or use drugs.  Allergies:  Allergies  Allergen Reactions  . Codeine Itching  . Latex Dermatitis and Other (See Comments)    Irritates the skin  . Mobic [Meloxicam] Other (See Comments)    Dizzy/ lightheaded  . Tape Dermatitis and Other (See Comments)    Paper tape is preferred     Medications: I have reviewed the patient's current medications.  Results for orders placed or performed during the hospital encounter of 09/21/19 (from the past 48 hour(s))  Urinalysis, Routine w reflex microscopic     Status: Abnormal    Collection Time: 09/21/19 11:59 AM  Result Value Ref Range   Color, Urine AMBER (A) YELLOW    Comment: BIOCHEMICALS MAY BE AFFECTED BY COLOR   APPearance TURBID (A) CLEAR   Specific Gravity, Urine >1.030 (H) 1.005 - 1.030   pH 5.0 5.0 - 8.0   Glucose, UA 100 (A) NEGATIVE mg/dL   Hgb urine dipstick SMALL (A) NEGATIVE   Bilirubin Urine LARGE (A) NEGATIVE   Ketones, ur 15 (A) NEGATIVE mg/dL   Protein, ur 100 (A) NEGATIVE mg/dL   Nitrite POSITIVE (A) NEGATIVE   Leukocytes,Ua TRACE (A) NEGATIVE    Comment: Performed at [facility], [address], [place], [place] [address]  Urinalysis, Microscopic (reflex)     Status: Abnormal   Collection Time: 09/21/19 11:59 AM  Result Value Ref Range   RBC / HPF 0-5 0 - 5 RBC/hpf   WBC, UA 6-10 0 - 5 WBC/hpf   Bacteria, UA FEW (A) NONE SEEN   Squamous Epithelial / LPF 0-5 0 - 5   Urine-Other AMORPHOUS URATES/PHOSPHATES     Comment: Performed at [facility], [place] [address] [place], [place] [address]  Lipase, blood     Status: Abnormal   Collection Time: 09/21/19 12:06 PM  Result Value Ref Range   Lipase 3,204 (H) 11 - 51 U/L    Comment: RESULTS CONFIRMED BY MANUAL DILUTION Performed  at [facility], [address], [place], [place] [address]   Comprehensive metabolic panel     Status: Abnormal   Collection Time: 09/21/19 12:06 PM  Result Value Ref Range   Sodium 139 135 - 145 mmol/L   Potassium 3.5 3.5 - 5.1 mmol/L   Chloride 101 98 - 111 mmol/L   CO2 25 22 - 32 mmol/L   Glucose, Bld 121 (H) 70 - 99 mg/dL   BUN 14 8 - 23 mg/dL   Creatinine, Ser 4.48 0.44 - 1.00 mg/dL   Calcium 9.7 8.9 - 18.5 mg/dL   Total Protein 7.4 6.5 - 8.1 g/dL   Albumin 4.1 3.5 - 5.0 g/dL   AST 631 (H) 15 - 41 U/L   ALT 245 (H) 0 - 44 U/L   Alkaline Phosphatase 332 (H) 38 - 126 U/L   Total Bilirubin 9.9 (H) 0.3 - 1.2 mg/dL   GFR calc non Af Amer >60 >60 mL/min   GFR calc Af Amer >60 >60 mL/min   Anion gap 13 5 - 15    Comment: Performed at  [facility], [address] [place], [place] [address]  CBC     Status: None   Collection Time: 09/21/19 12:06 PM  Result Value Ref Range   WBC 6.3 4.0 - 10.5 K/uL   RBC 4.87 3.87 - 5.11 MIL/uL   Hemoglobin 14.8 12.0 - 15.0 g/dL   HCT 63.7 85.8 - 85.0 %   MCV 90.6 80.0 - 100.0 fL   MCH 30.4 26.0 - 34.0 pg   MCHC 33.6 30.0 - 36.0 g/dL   RDW 27.7 41.2 - 87.8 %   Platelets 306 150 - 400 K/uL   nRBC 0.0 0.0 - 0.2 %    Comment: Performed at [facility], [address], [place], [place] [address]  Respiratory Panel by RT PCR (Flu A&B, Covid) - Nasopharyngeal Swab     Status: None   Collection Time: 09/21/19  5:32 PM   Specimen: Nasopharyngeal Swab  Result Value Ref Range   SARS Coronavirus 2 by RT PCR NEGATIVE NEGATIVE    Comment: (NOTE) SARS-CoV-2 target nucleic acids are NOT DETECTED. The SARS-CoV-2 RNA is generally detectable in upper respiratoy specimens during the acute phase of infection. The lowest concentration of SARS-CoV-2 viral copies this assay can detect is 131 copies/mL. A negative result does not preclude SARS-Cov-2 infection and should not be used as the sole basis for treatment or other patient management decisions. A negative result may occur with  improper specimen collection/handling, submission of specimen other than nasopharyngeal swab, presence of viral mutation(s) within the areas targeted by this assay, and inadequate number of viral copies (<131 copies/mL). A negative result must be combined with clinical observations, patient history, and epidemiological information. The expected result is Negative. Fact Sheet for Patients:  https://www.moore.com/ Fact Sheet for Healthcare Providers:  https://www.young.biz/ This test is not yet ap proved or cleared by the [place] FDA and  has been authorized for detection and/or diagnosis of SARS-CoV-2 by FDA under an Emergency Use Authorization (EUA). This EUA  will remain  in effect (meaning this test can be used) for the duration of the COVID-19 declaration under Section 564(b)(1) of the Act, 21 U.S.C. section 360bbb-3(b)(1), unless the authorization is terminated or revoked sooner.    Influenza A by PCR NEGATIVE NEGATIVE   Influenza B by PCR NEGATIVE NEGATIVE    Comment: (NOTE) The Xpert Xpress SARS-CoV-2/FLU/RSV assay is intended  as an aid in  the diagnosis of influenza from Nasopharyngeal swab specimens and  should not be used as a sole basis for treatment. Nasal washings and  aspirates are unacceptable for Xpert Xpress SARS-CoV-2/FLU/RSV  testing. Fact Sheet for Patients: https://www.moore.com/ Fact Sheet for Healthcare Providers: https://www.young.biz/ This test is not yet approved or cleared by the [place] FDA and  has been authorized for detection and/or diagnosis of SARS-CoV-2 by  FDA under an Emergency Use Authorization (EUA). This EUA will remain  in effect (meaning this test can be used) for the duration of the  Covid-19 declaration under Section 564(b)(1) of the Act, 21  U.S.C. section 360bbb-3(b)(1), unless the authorization is  terminated or revoked. Performed at [facility], [address], [place], [place] [address]     Ct Abdomen Pelvis W Contrast  Result Date: 09/20/2019 CLINICAL DATA:  Burning sensation in the abdomen with increased liver function studies and elevated bilirubin. Possible obstructive jaundice. EXAM: CT ABDOMEN AND PELVIS WITH CONTRAST TECHNIQUE: Multidetector CT imaging of the abdomen and pelvis was performed using the standard protocol following bolus administration of intravenous contrast. CONTRAST:  ISOVUE-300 IOPAMIDOL (ISOVUE-300) INJECTION 61% COMPARISON:  None. FINDINGS: Lower chest: The lung bases are clear of an acute process. No pleural effusions are identified. The heart is normal in size. No pericardial effusion. Hepatobiliary: No focal  hepatic lesions are identified. There is intra and extrahepatic biliary dilatation. The common bile duct in the porta hepatis measures a maximum of 13 mm and measures a maximum of 9.5 mm in the head of the pancreas. It does taper down to the ampulla. There could be a distal stricture. I do not see any definite obstructing common bile duct stones and no obvious ampullary lesion or pancreatic head mass. The gallbladder is surgically absent. History of cholecystectomy in May 2020. Pancreas: No mass or inflammation. Normal caliber and course of the main pancreatic duct. The distal duct is slightly dilated measuring 4 mm in the head of the pancreas. Spleen: Normal size. Small scattered lesions on the portal venous phase images are not persistent on the delayed images and may be due to white pulp red pulp differentiation. Adrenals/Urinary Tract: Adrenal glands and kidneys are unremarkable. There is a simple right renal cyst noted. No hydronephrosis. The bladder is unremarkable. Stomach/Bowel: The stomach, duodenum, small bowel and colon are grossly normal without oral contrast. No inflammatory changes, mass lesions or obstructive findings. The terminal ileum and appendix are normal. Scattered colonic diverticulosis but no findings for acute diverticulitis. Vascular/Lymphatic: Scattered aortic calcifications without aneurysm or dissection. The branch vessels are patent. The major venous structures are patent. No mesenteric or retroperitoneal mass or adenopathy. Reproductive: The uterus and ovaries are unremarkable. Other: No pelvic mass or pelvic adenopathy. No free pelvic fluid collections. No inguinal mass or adenopathy. Musculoskeletal: L3 hemangioma noted. IMPRESSION: 1. Intra and extrahepatic biliary dilatation without obvious cause. No definite common bile duct stone, ampullary lesion or pancreatic head mass. Post cholecystectomy dilatation is unlikely given the recent time course of the cholecystectomy. A distal  stricture is possible. Recommend ERCP or MRCP (without and with contrast) for further evaluation. 2. No hepatic lesions. 3. No abdominal or pelvic mass or adenopathy. 4. Moderate scattered colonic diverticulosis but no findings for acute diverticulitis. Electronically Signed   By: Rudie Meyer M.D.   On: 09/20/2019 14:30   Mr Abdomen Mrcp Vivien Rossetti Contast  Result Date: 09/22/2019 CLINICAL DATA:  Abnormal liver function tests. Pancreatitis. Possible  biliary obstruction. EXAM: MRI ABDOMEN WITHOUT AND WITH CONTRAST (INCLUDING MRCP) TECHNIQUE: Multiplanar multisequence MR imaging of the abdomen was performed both before and after the administration of intravenous contrast. Heavily T2-weighted images of the biliary and pancreatic ducts were obtained, and three-dimensional MRCP images were rendered by post processing. CONTRAST:  7.877mL GADAVIST GADOBUTROL 1 MMOL/ML IV SOLN COMPARISON:  CT abdomen 09/20/2019 FINDINGS: Lower chest: Unremarkable Hepatobiliary: Prior cholecystectomy. Common bile duct measures up to 1.3 cm in diameter there is mild to moderate intrahepatic biliary dilatation. 4 mm distal CBD stone near the ampulla on image 10/22 is the likely cause. Subtle accentuated enhancement of the walls of the distal CBD. No significant focal hepatic lesion is identified. Although partially obscured by adjacent metal artifact from cholecystectomy clips, the complex appearance on image 18/4 raises suspicion for additional small stones in the cystic duct remnant. Pancreas: Subtle pancreatic/peripancreatic edema may reflect acute pancreatitis. No abscess, pseudocyst, pancreatic necrosis identified. Spleen: Several indistinctly marginated hypoechoic lesions are present in the spleen on arterial phase images, such as the 1.7 by 1.1 cm anterior splenic lesion on image 15/31. 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 Adrenals/Urinary Tract: Dominant Bosniak category 1 2.8  cm nonenhancing fluid signal intensity lesion compatible with cyst. Two additional small T2 hyperintense lesions are present in the right mid kidney on image 18/4 and are likewise probably cysts. Adrenal glands unremarkable. Stomach/Bowel: Descending colon diverticulosis. Vascular/Lymphatic:  Unremarkable Other:  No supplemental non-categorized findings. Musculoskeletal: Small hemangioma eccentric to the left in the L3 vertebral body. IMPRESSION: 1. 4 mm distal CBD stone near the ampulla, causing mild to moderate intrahepatic and extrahepatic biliary dilatation. Probable additional small stones in the cystic duct remnant for example on image 18/4. 2. Subtle pancreatic/peripancreatic edema may reflect acute pancreatitis. No abscess, pseudocyst, or pancreatic necrosis identified. 3. Bosniak category 1 cyst of the left kidney. 4. Descending colon diverticulosis. Electronically Signed   By: Gaylyn RongWalter  Liebkemann M.D.   On: 09/22/2019 07:42    ROS her urine has been dark for about a week and it is getting darker Blood pressure 124/66, pulse (!) 41, temperature 97.7 F (36.5 C), temperature source Oral, resp. rate 17, height 5\' 2"  (1.575 m), weight 77 kg, SpO2 95 %. Physical Exam vital signs stable afebrile no acute distress exam lungs are clear heart regular rate and rhythm abdomen is soft occasional bowel sounds some midepigastric discomfort nontender lower quadrants CT MRI and lab work reviewed  Assessment/Plan: Gallstone pancreatitis with positive MRCP for residual stone Plan: The risk benefits methods and success rate of ERCP was discussed with the patient and will repeat labs today and allow sips of clear liquids and if okay can have tray of clear liquids today but n.p.o. after midnight for ERCP tomorrow morning and please call sooner if any question or problem  [name] Young 09/22/2019, 9:55 AM

## 2019-09-22 NOTE — ED Notes (Addendum)
Pt arrived to Rm 52 via stretcher - pt ambulatory to bathroom and to room w/o difficulty. Pt noted w/brown-tinged urine. Pt voices understanding of NPO and aware waiting for MRCP results. All of pt's belongings on counter and on table in room from previous room. Pt verified all present including her cell phone.

## 2019-09-23 ENCOUNTER — Encounter (HOSPITAL_COMMUNITY)
Admission: EM | Disposition: A | Discharge status: Home / Self Care | Payer: Self-pay | Source: Home / Self Care | Attending: Internal Medicine

## 2019-09-23 ENCOUNTER — Encounter (HOSPITAL_COMMUNITY): Payer: Self-pay | Admitting: Internal Medicine

## 2019-09-23 ENCOUNTER — Inpatient Hospital Stay (HOSPITAL_COMMUNITY): Payer: Medicare PPO

## 2019-09-23 ENCOUNTER — Inpatient Hospital Stay (HOSPITAL_COMMUNITY): Payer: Medicare PPO | Admitting: Anesthesiology

## 2019-09-23 HISTORY — PX: REMOVAL OF STONES: SHX5545

## 2019-09-23 HISTORY — PX: ERCP: SHX5425

## 2019-09-23 HISTORY — PX: SPHINCTEROTOMY: SHX5544

## 2019-09-23 LAB — COMPREHENSIVE METABOLIC PANEL
ALT: 148 U/L — ABNORMAL HIGH (ref 0–44)
AST: 71 U/L — ABNORMAL HIGH (ref 15–41)
Albumin: 3 g/dL — ABNORMAL LOW (ref 3.5–5.0)
Alkaline Phosphatase: 253 U/L — ABNORMAL HIGH (ref 38–126)
Anion gap: 11 (ref 5–15)
BUN: 11 mg/dL (ref 8–23)
CO2: 24 mmol/L (ref 22–32)
Calcium: 9.2 mg/dL (ref 8.9–10.3)
Chloride: 108 mmol/L (ref 98–111)
Creatinine, Ser: 0.99 mg/dL (ref 0.44–1.00)
GFR calc Af Amer: >60 mL/min (ref >60)
GFR calc non Af Amer: 59 mL/min — ABNORMAL LOW (ref >60)
Glucose, Bld: 89 mg/dL (ref 70–99)
Potassium: 3 mmol/L — ABNORMAL LOW (ref 3.5–5.1)
Sodium: 143 mmol/L (ref 135–145)
Total Bilirubin: 5 mg/dL — ABNORMAL HIGH (ref 0.3–1.2)
Total Protein: 5.9 g/dL — ABNORMAL LOW (ref 6.5–8.1)

## 2019-09-23 LAB — CBC
HCT: 35.7 % — ABNORMAL LOW (ref 36.0–46.0)
Hemoglobin: 12.1 g/dL (ref 12.0–15.0)
MCH: 30.5 pg (ref 26.0–34.0)
MCHC: 33.9 g/dL (ref 30.0–36.0)
MCV: 89.9 fL (ref 80.0–100.0)
Platelets: 236 10*3/uL (ref 150–400)
RBC: 3.97 MIL/uL (ref 3.87–5.11)
RDW: 12.9 % (ref 11.5–15.5)
WBC: 4 10*3/uL (ref 4.0–10.5)
nRBC: 0 % (ref 0.0–0.2)

## 2019-09-23 LAB — LIPASE, BLOOD: Lipase: 139 U/L — ABNORMAL HIGH (ref 11–51)

## 2019-09-23 LAB — GLUCOSE, CAPILLARY: Glucose-Capillary: 95 mg/dL (ref 70–99)

## 2019-09-23 SURGERY — ERCP, WITH INTERVENTION IF INDICATED
Anesthesia: General

## 2019-09-23 MED ORDER — PHENYLEPHRINE 40 MCG/ML (10ML) SYRINGE FOR IV PUSH (FOR BLOOD PRESSURE SUPPORT)
PREFILLED_SYRINGE | INTRAVENOUS | Status: DC | PRN
  Administered 2019-09-23: 40 ug via INTRAVENOUS
  Administered 2019-09-23: 80 ug via INTRAVENOUS

## 2019-09-23 MED ORDER — INDOMETHACIN 50 MG RE SUPP
RECTAL | Status: DC | PRN
  Administered 2019-09-23: 100 mg via RECTAL

## 2019-09-23 MED ORDER — ROCURONIUM BROMIDE 10 MG/ML (PF) SYRINGE
PREFILLED_SYRINGE | INTRAVENOUS | Status: DC | PRN
  Administered 2019-09-23: 40 mg via INTRAVENOUS

## 2019-09-23 MED ORDER — POTASSIUM CHLORIDE CRYS ER 20 MEQ PO TBCR
40.0000 meq | EXTENDED_RELEASE_TABLET | Freq: Once | ORAL | Status: AC
  Administered 2019-09-23: 40 meq via ORAL
  Filled 2019-09-23: qty 2

## 2019-09-23 MED ORDER — GLUCAGON HCL RDNA (DIAGNOSTIC) 1 MG IJ SOLR
INTRAMUSCULAR | Status: AC
  Filled 2019-09-23: qty 1

## 2019-09-23 MED ORDER — PROPOFOL 10 MG/ML IV BOLUS
INTRAVENOUS | Status: DC | PRN
  Administered 2019-09-23: 100 mg via INTRAVENOUS

## 2019-09-23 MED ORDER — SUGAMMADEX SODIUM 200 MG/2ML IV SOLN
INTRAVENOUS | Status: DC | PRN
  Administered 2019-09-23: 300 mg via INTRAVENOUS

## 2019-09-23 MED ORDER — SODIUM CHLORIDE 0.9 % IV SOLN: INTRAVENOUS | Status: DC

## 2019-09-23 MED ORDER — FENTANYL CITRATE (PF) 100 MCG/2ML IJ SOLN
INTRAMUSCULAR | Status: DC | PRN
  Administered 2019-09-23: 50 ug via INTRAVENOUS

## 2019-09-23 MED ORDER — LIDOCAINE 2% (20 MG/ML) 5 ML SYRINGE
INTRAMUSCULAR | Status: DC | PRN
  Administered 2019-09-23: 80 mg via INTRAVENOUS

## 2019-09-23 MED ORDER — ONDANSETRON HCL 4 MG/2ML IJ SOLN
INTRAMUSCULAR | Status: DC | PRN
  Administered 2019-09-23: 4 mg via INTRAVENOUS

## 2019-09-23 MED ORDER — INDOMETHACIN 50 MG RE SUPP
RECTAL | Status: AC
  Filled 2019-09-23: qty 2

## 2019-09-23 MED ORDER — SODIUM CHLORIDE 0.9 % IV SOLN
INTRAVENOUS | Status: AC
  Administered 2019-09-23: 10:00:00 via INTRAVENOUS

## 2019-09-23 MED ORDER — SODIUM CHLORIDE 0.9 % IV SOLN
INTRAVENOUS | Status: DC | PRN
  Administered 2019-09-23: 15 mL

## 2019-09-23 NOTE — Anesthesia Postprocedure Evaluation (Signed)
Anesthesia Post Note  Patient: [name] Young  Procedure(s) Performed: ENDOSCOPIC RETROGRADE CHOLANGIOPANCREATOGRAPHY (ERCP) (N/A ) SPHINCTEROTOMY REMOVAL OF STONES     Anesthesia Post Evaluation  Last Vitals:  Vitals:   09/23/19 0717 09/23/19 0854  BP: (!) 195/81 (!) 151/88  Pulse:  67  Resp: 13 15  Temp: (!) 36.2 C 36.6 C  SpO2: 96% 100%    Last Pain:  Vitals:   09/23/19 0854  TempSrc: Oral  PainSc: 0-No pain                 Barnet Glasgow

## 2019-09-23 NOTE — Op Note (Signed)
[facility] Patient Name: [name] Young Procedure Date : 09/23/2019 MRN: [id number] Attending MD: Clarene Essex , MD Date of Birth: Oct 07, 1951 CSN: [id number] Age: 68 Admit Type: Inpatient Procedure:                ERCP Indications:              Bile duct stone(s) on MRCP in patient with                            gallstone pancreatitis status post cholecystectomy Providers:                Clarene Essex, MD, Vista Lawman, RN, William Dalton,                            Technician Referring MD:              Medicines:                General Anesthesia Complications:            No immediate complications. Estimated Blood Loss:     Estimated blood loss: none. Procedure:                Pre-Anesthesia Assessment:                           - Prior to the procedure, a History and Physical                            was performed, and patient medications and                            allergies were reviewed. The patient's tolerance of                            previous anesthesia was also reviewed. The risks                            and benefits of the procedure and the sedation                            options and risks were discussed with the patient.                            All questions were answered, and informed consent                            was obtained. Prior Anticoagulants: The patient has                            taken no previous anticoagulant or antiplatelet                            agents. ASA Grade Assessment: II - A patient with  mild systemic disease. After reviewing the risks                            and benefits, the patient was deemed in                            satisfactory condition to undergo the procedure.                           After obtaining informed consent, the scope was                            passed under direct vision. Throughout the                            procedure, the patient's blood pressure,  pulse, and                            oxygen saturations were monitored continuously. The                            [id number]-[id number] ([id number] Olympus duodenoscope was                            introduced through the mouth, and used to inject                            contrast into and used to cannulate the bile duct.                            The ERCP was accomplished without difficulty. The                            patient tolerated the procedure well. Scope In: Scope Out: Findings:      The major papilla was bulging. Deep selective cannulation was obtained       on the first attempt and there was no pancreatic duct injection or wire       advancement and dye was injected which confirmed a slightly dilated duct       and we proceeded with the biliary sphincterotomy which was made with a       Hydratome sphincterotome using ERBE electrocautery. There was no       post-sphincterotomy bleeding. Choledocholithiasis was found in a dilated       duct. The common bile duct contained one stone, which was small in       diameter. To discover objects, the biliary tree was swept with both a 12       and 15 mm balloon starting at the bifurcation. One stone was removed. No       stones remained. Multiple balloon pull-through's were done and we did       have to lower the 15 mm balloon to pull it through the distal duct and       there was no residual abnormalities on occlusion cholangiogram done in       the customary fashion and she did  have some sluggish biliary drainage       due to distal duct edema no residual filling defect was seen and we       elected not to dilate or place a stent assuming edema would resolve in a       few days Impression:               - The major papilla appeared to be bulging.                           - Choledocholithiasis was found. Complete removal                            was accomplished by biliary sphincterotomy and                            balloon  extraction.                           - A biliary sphincterotomy was performed.                           - The biliary tree was swept. There was no                            pancreatic duct injection or wire advancement Recommendation:           - Clear liquid diet for 6 hours. May slowly advance                            if doing well                           - Continue present medications.                           - Return to GI clinic PRN. Follow liver tests back                            to normal although they may take a little while is                            done distal duct edema as above                           - Telephone GI clinic if symptomatic PRN. Procedure Code(s):        --- Professional ---                           [phone], Endoscopic retrograde                            cholangiopancreatography (ERCP); with removal of                            calculi/debris from biliary/pancreatic duct(s)  [address], Endoscopic retrograde                            cholangiopancreatography (ERCP); with                            sphincterotomy/papillotomy Diagnosis Code(s):        --- Professional ---                           K80.50, Calculus of bile duct without cholangitis                            or cholecystitis without obstruction                           K83.8, Other specified diseases of biliary tract CPT copyright 2019 [employer]. All rights reserved. The codes documented in this report are preliminary and upon coder review may  be revised to meet current compliance requirements. Vida Rigger, MD 09/23/2019 8:40:11 AM This report has been signed electronically. Number of Addenda: 0

## 2019-09-23 NOTE — Anesthesia Postprocedure Evaluation (Signed)
Anesthesia Post Note  Patient: [name] Young  Procedure(s) Performed: ENDOSCOPIC RETROGRADE CHOLANGIOPANCREATOGRAPHY (ERCP) (N/A ) SPHINCTEROTOMY REMOVAL OF STONES     Patient location during evaluation: Endoscopy Anesthesia Type: General Level of consciousness: awake and alert Pain management: pain level controlled Vital Signs Assessment: post-procedure vital signs reviewed and stable Respiratory status: spontaneous breathing, nonlabored ventilation, respiratory function stable and patient connected to nasal cannula oxygen Cardiovascular status: blood pressure returned to baseline and stable Postop Assessment: no apparent nausea or vomiting Anesthetic complications: no    Last Vitals:  Vitals:   09/23/19 0717 09/23/19 0854  BP: (!) 195/81 (!) 151/88  Pulse:  67  Resp: 13 15  Temp: (!) 36.2 C 36.6 C  SpO2: 96% 100%    Last Pain:  Vitals:   09/23/19 0854  TempSrc: Oral  PainSc: 0-No pain                 Barnet Glasgow

## 2019-09-23 NOTE — Progress Notes (Signed)
PROGRESS NOTE    [name] Young  FVC:[id number] DOB: August 13, 1951 DOA: 09/21/2019 PCP: Seward Anitria, MD  Brief Narrative: 68 year old female with history of asthma, bronchitis, dyslipidemia, endometriosis underwent laparoscopic cholecystectomy in May subsequently she had intermittent episodes of upper abdominal pain presented to the emergency room 12/8 with dull constant persistent epigastric and right-sided abdominal pain radiating to the back, in the ED she was noted to have abnormal LFTs, lipase of 3000, CT abdomen pelvis noted biliary dilation -Admitted with acute gallstone pancreatitis, choledocholithiasis  -12/10, underwent ERCP with sphincterotomy and stone extraction  Assessment & Plan:   Gallstone pancreatitis/choledocholithiasis -Status post lap cholecystectomy in 02/2019 -MRCP notes retained CBD stone near ampulla causing obstruction,  -Underwent ERCP with sphincterotomy, stone extraction today, no stents placed -Continue IV Zosyn another day -Monitor C met in a.m. -Clear liquid diet, advance as tolerated, cutdown IV fluids today  GERD -Continue PPI  DVT prophylaxis: Lovenox Code Status: Full code Family Communication: Husband at bedside Disposition Plan: Home pending above work-up  Consultants:  [facility] Procedures: ER CP with sphincterotomy and stone extraction 12/10 Dr. Watt Climes  Antimicrobials:    Subjective: -Continues to have abdominal discomfort but pain medicines are helping some, no nausea vomiting  Objective: Vitals:   09/23/19 0423 09/23/19 0717 09/23/19 0854 09/23/19 0905  BP: (!) 161/72 (!) 195/81 (!) 151/88 (!) 171/85  Pulse: (!) 57  67 (!) 53  Resp: '18 13 15 15  ' Temp: 97.7 F (36.5 C) (!) 97.1 F (36.2 C) 97.9 F (36.6 C)   TempSrc: Oral Temporal Oral   SpO2: 96% 96% 100% 100%  Weight:  77 kg    Height:  '5\' 2"'  (1.575 m)      Intake/Output Summary (Last 24 hours) at 09/23/2019 1156 Last data filed at 09/23/2019 1010 Gross per 24  hour  Intake 2466.23 ml  Output 3 ml  Net 2463.23 ml   Filed Weights   09/21/19 1156 09/23/19 0717  Weight: 77 kg 77 kg    Examination:  Gen: Pleasant obese female, laying in bed, AAOx3, no distress HEENT: PERRLA, Neck supple, no JVD Lungs: Clear CVS: RRR,No Gallops,Rubs or new Murmurs Abd: Soft, improved epigastric tenderness, bowel sounds diminished Extremities: No edema  skin: no new rashes Psychiatry: Judgement and insight appear normal. Mood & affect appropriate.     Data Reviewed:   CBC: Recent Labs  Lab 09/20/19 1808 09/21/19 1206 09/22/19 0949 09/23/19 0332  WBC 6.7 6.3 4.2 4.0  NEUTROABS  --   --  2.6  --   HGB 14.6 14.8 13.7 12.1  HCT 42.2 44.1 40.5 35.7*  MCV 89.6 90.6 91.8 89.9  PLT 275 306 244 638   Basic Metabolic Panel: Recent Labs  Lab 09/20/19 1808 09/21/19 1206 09/22/19 0949 09/23/19 0332  NA 140 139 142 143  K 3.1* 3.5 3.5 3.0*  CL 103 101 106 108  CO2 '25 25 24 24  ' GLUCOSE 97 121* 103* 89  BUN '10 14 13 11  ' CREATININE 0.86 0.82 0.78 0.99  CALCIUM 9.8 9.7 9.5 9.2   GFR: Estimated Creatinine Clearance: 53.7 mL/min (by C-G formula based on SCr of 0.99 mg/dL). Liver Function Tests: Recent Labs  Lab 09/20/19 1808 09/21/19 1206 09/22/19 0949 09/23/19 0332  AST 135* 144* 107* 71*  ALT 255* 245* 193* 148*  ALKPHOS 302* 332* 291* 253*  BILITOT 9.1* 9.9* 7.3* 5.0*  PROT 7.4 7.4 6.4* 5.9*  ALBUMIN 3.8 4.1 3.4* 3.0*   Recent Labs  Lab 09/20/19 1808 09/21/19  1206 09/22/19 0949 09/23/19 0332  LIPASE 2,976* 3,204* 384* 139*   No results for input(s): AMMONIA in the last 168 hours. Coagulation Profile: No results for input(s): INR, PROTIME in the last 168 hours. Cardiac Enzymes: No results for input(s): CKTOTAL, CKMB, CKMBINDEX, TROPONINI in the last 168 hours. BNP (last 3 results) No results for input(s): PROBNP in the last 8760 hours. HbA1C: No results for input(s): HGBA1C in the last 72 hours. CBG: Recent Labs  Lab  09/23/19 0627  GLUCAP 95   Lipid Profile: No results for input(s): CHOL, HDL, LDLCALC, TRIG, CHOLHDL, LDLDIRECT in the last 72 hours. Thyroid Function Tests: No results for input(s): TSH, T4TOTAL, FREET4, T3FREE, THYROIDAB in the last 72 hours. Anemia Panel: No results for input(s): VITAMINB12, FOLATE, FERRITIN, TIBC, IRON, RETICCTPCT in the last 72 hours. Urine analysis:    Component Value Date/Time   COLORURINE AMBER (A) 09/21/2019 1159   APPEARANCEUR TURBID (A) 09/21/2019 1159   LABSPEC >1.030 (H) 09/21/2019 1159   PHURINE 5.0 09/21/2019 1159   GLUCOSEU 100 (A) 09/21/2019 1159   HGBUR SMALL (A) 09/21/2019 1159   BILIRUBINUR LARGE (A) 09/21/2019 1159   KETONESUR 15 (A) 09/21/2019 1159   PROTEINUR 100 (A) 09/21/2019 1159   UROBILINOGEN 1.0 07/29/2015 0209   NITRITE POSITIVE (A) 09/21/2019 1159   LEUKOCYTESUR TRACE (A) 09/21/2019 1159   Sepsis Labs: '@LABRCNTIP' (procalcitonin:4,lacticidven:4)  ) Recent Results (from the past 240 hour(s))  Respiratory Panel by RT PCR (Flu A&B, Covid) - Nasopharyngeal Swab     Status: None   Collection Time: 09/21/19  5:32 PM   Specimen: Nasopharyngeal Swab  Result Value Ref Range Status   SARS Coronavirus 2 by RT PCR NEGATIVE NEGATIVE Final    Comment: (NOTE) SARS-CoV-2 target nucleic acids are NOT DETECTED. The SARS-CoV-2 RNA is generally detectable in upper respiratoy specimens during the acute phase of infection. The lowest concentration of SARS-CoV-2 viral copies this assay can detect is 131 copies/mL. A negative result does not preclude SARS-Cov-2 infection and should not be used as the sole basis for treatment or other patient management decisions. A negative result may occur with  improper specimen collection/handling, submission of specimen other than nasopharyngeal swab, presence of viral mutation(s) within the areas targeted by this assay, and inadequate number of viral copies (<131 copies/mL). A negative result must be combined  with clinical observations, patient history, and epidemiological information. The expected result is Negative. Fact Sheet for Patients:  PinkCheek.be Fact Sheet for Healthcare Providers:  GravelBags.it This test is not yet ap proved or cleared by the [place] FDA and  has been authorized for detection and/or diagnosis of SARS-CoV-2 by FDA under an Emergency Use Authorization (EUA). This EUA will remain  in effect (meaning this test can be used) for the duration of the COVID-19 declaration under Section 564(b)(1) of the Act, 21 U.S.C. section 360bbb-3(b)(1), unless the authorization is terminated or revoked sooner.    Influenza A by PCR NEGATIVE NEGATIVE Final   Influenza B by PCR NEGATIVE NEGATIVE Final    Comment: (NOTE) The Xpert Xpress SARS-CoV-2/FLU/RSV assay is intended as an aid in  the diagnosis of influenza from Nasopharyngeal swab specimens and  should not be used as a sole basis for treatment. Nasal washings and  aspirates are unacceptable for Xpert Xpress SARS-CoV-2/FLU/RSV  testing. Fact Sheet for Patients: PinkCheek.be Fact Sheet for Healthcare Providers: GravelBags.it This test is not yet approved or cleared by the [place] FDA and  has been authorized for detection and/or diagnosis  of SARS-CoV-2 by  FDA under an Emergency Use Authorization (EUA). This EUA will remain  in effect (meaning this test can be used) for the duration of the  Covid-19 declaration under Section 564(b)(1) of the Act, 21  U.S.C. section 360bbb-3(b)(1), unless the authorization is  terminated or revoked. Performed at [facility], [place] [address] [place], [place] [address]   Surgical pcr screen     Status: Abnormal   Collection Time: 09/22/19  7:50 PM   Specimen: Nasal Mucosa; Nasal Swab  Result Value Ref Range Status   MRSA, PCR POSITIVE (A) NEGATIVE Final    Staphylococcus aureus POSITIVE (A) NEGATIVE Final    Comment: RESULT CALLED TO, READ BACK BY AND VERIFIED WITH: E . OFORI RN 09/22/19 2139 JDW (NOTE) The Xpert SA Assay (FDA approved for NASAL specimens in patients 49 years of age and older), is one component of a comprehensive surveillance program. It is not intended to diagnose infection nor to guide or monitor treatment. Performed at [facility], [place] [address], [place], [place] [address]          Radiology Studies: DG ERCP sphincterotomy  Result Date: 09/23/2019 CLINICAL DATA:  Jaundice, choledocholithiasis EXAM: ERCP TECHNIQUE: Multiple spot images obtained with the fluoroscopic device and submitted for interpretation post-procedure. COMPARISON:  MRCP 09/22/2019 FINDINGS: A series of fluoroscopic spot images document endoscopic cannulation and opacification of the CBD with passage of a balloon tipped catheter. There is incomplete opacification of the intrahepatic biliary tree which appears ectatic centrally. IMPRESSION: Endoscopic CBD cannulation and intervention. These images were submitted for radiologic interpretation only. Please see the procedural report for the amount of contrast and the fluoroscopy time utilized. Electronically Signed   By: Lucrezia Europe M.D.   On: 09/23/2019 08:45   MR ABDOMEN MRCP W WO CONTAST  Result Date: 09/22/2019 CLINICAL DATA:  Abnormal liver function tests. Pancreatitis. Possible biliary obstruction. EXAM: MRI ABDOMEN WITHOUT AND WITH CONTRAST (INCLUDING MRCP) TECHNIQUE: Multiplanar multisequence MR imaging of the abdomen was performed both before and after the administration of intravenous contrast. Heavily T2-weighted images of the biliary and pancreatic ducts were obtained, and three-dimensional MRCP images were rendered by post processing. CONTRAST:  7.58m GADAVIST GADOBUTROL 1 MMOL/ML IV SOLN COMPARISON:  CT abdomen 09/20/2019 FINDINGS: Lower chest: Unremarkable Hepatobiliary: Prior  cholecystectomy. Common bile duct measures up to 1.3 cm in diameter there is mild to moderate intrahepatic biliary dilatation. 4 mm distal CBD stone near the ampulla on image 10/22 is the likely cause. Subtle accentuated enhancement of the walls of the distal CBD. No significant focal hepatic lesion is identified. Although partially obscured by adjacent metal artifact from cholecystectomy clips, the complex appearance on image 18/4 raises suspicion for additional small stones in the cystic duct remnant. Pancreas: Subtle pancreatic/peripancreatic edema may reflect acute pancreatitis. No abscess, pseudocyst, pancreatic necrosis identified. Spleen: Several indistinctly marginated hypoechoic lesions are present in the spleen on arterial phase images, such as the 1.7 by 1.1 cm anterior splenic lesion on image 15/31. 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 Adrenals/Urinary Tract: Dominant Bosniak category 1 2.8 cm nonenhancing fluid signal intensity lesion compatible with cyst. Two additional small T2 hyperintense lesions are present in the right mid kidney on image 18/4 and are likewise probably cysts. Adrenal glands unremarkable. Stomach/Bowel: Descending colon diverticulosis. Vascular/Lymphatic:  Unremarkable Other:  No supplemental non-categorized findings. Musculoskeletal: Small hemangioma eccentric to the left in the L3 vertebral body. IMPRESSION: 1. 4  mm distal CBD stone near the ampulla, causing mild to moderate intrahepatic and extrahepatic biliary dilatation. Probable additional small stones in the cystic duct remnant for example on image 18/4. 2. Subtle pancreatic/peripancreatic edema may reflect acute pancreatitis. No abscess, pseudocyst, or pancreatic necrosis identified. 3. Bosniak category 1 cyst of the left kidney. 4. Descending colon diverticulosis. Electronically Signed   By: Van Clines M.D.   On: 09/22/2019 07:42        Scheduled  Meds: . enoxaparin (LOVENOX) injection  40 mg Subcutaneous Q24H  . mupirocin ointment  1 application Nasal BID  . pantoprazole  40 mg Oral Daily  . senna  1 tablet Oral BID  . sucralfate  1 g Oral TID WC & HS  . traZODone  25 mg Oral QHS   Continuous Infusions: . sodium chloride 50 mL/hr at 09/23/19 1010  . piperacillin-tazobactam (ZOSYN)  IV 3.375 g (09/23/19 0625)     LOS: 2 days    Time spent: 34mn    PDomenic Polite MD [facility] Page via [email or web address], password [email or web address] After 7PM please contact night-coverage  09/23/2019, 11:56 AM

## 2019-09-23 NOTE — Transfer of Care (Signed)
Immediate Anesthesia Transfer of Care Note  Patient: [name] Young  Procedure(s) Performed: ENDOSCOPIC RETROGRADE CHOLANGIOPANCREATOGRAPHY (ERCP) (N/A ) SPHINCTEROTOMY REMOVAL OF STONES  Patient Location: PACU  Anesthesia Type:General  Level of Consciousness: oriented, drowsy and patient cooperative  Airway & Oxygen Therapy: Patient Spontanous Breathing and Patient connected to nasal cannula oxygen  Post-op Assessment: Report given to RN and Post -op Vital signs reviewed and stable  Post vital signs: Reviewed  Last Vitals:  Vitals Value Taken Time  BP 151/88 09/23/19 0854  Temp    Pulse 66 09/23/19 0858  Resp 16 09/23/19 0858  SpO2 100 % 09/23/19 0858  Vitals shown include unvalidated device data.  Last Pain:  Vitals:   09/23/19 0717  TempSrc: Temporal  PainSc: 7       Patients Stated Pain Goal: 5 (49/44/96 7591)  Complications: No apparent anesthesia complications

## 2019-09-23 NOTE — Progress Notes (Signed)
[name] Young 7:46 AM  Subjective: Patient a little sleepy says she did not sleep last night and does have some left lower quadrant pain related to constipation and requests an enema and we rediscussed her ERCP she has no other new complaints  Objective: Vital signs stable afebrile no acute distress exam please see preassessment evaluation lipase decreased liver test decreased CBC okay BUN and creatinine okay potassium a little low  Assessment: Resolving gallstone pancreatitis and CBD stones  Plan: Okay to proceed with ERCP today with anesthesia assistance  [facility]  office [phone] After 5PM or if no answer call [phone]

## 2019-09-23 NOTE — Anesthesia Procedure Notes (Signed)
Procedure Name: Intubation Date/Time: 09/23/2019 7:56 AM Performed by: Jenne Campus, CRNA Pre-anesthesia Checklist: Patient identified, Emergency Drugs available, Suction available and Patient being monitored Patient Re-evaluated:Patient Re-evaluated prior to induction Oxygen Delivery Method: Circle System Utilized Preoxygenation: Pre-oxygenation with 100% oxygen Induction Type: IV induction Ventilation: Mask ventilation without difficulty Laryngoscope Size: Miller and 2 Grade View: Grade I Tube type: Oral Tube size: 7.0 mm Number of attempts: 1 Airway Equipment and Method: Stylet and Oral airway Placement Confirmation: ETT inserted through vocal cords under direct vision,  positive ETCO2 and breath sounds checked- equal and bilateral Secured at: 21 cm Tube secured with: Tape Dental Injury: Teeth and Oropharynx as per pre-operative assessment

## 2019-09-24 DIAGNOSIS — K8050 Calculus of bile duct without cholangitis or cholecystitis without obstruction: Secondary | ICD-10-CM

## 2019-09-24 LAB — CBC WITH DIFFERENTIAL/PLATELET
Abs Immature Granulocytes: 0.01 10*3/uL (ref 0.00–0.07)
Basophils Absolute: 0 10*3/uL (ref 0.0–0.1)
Basophils Relative: 1 %
Eosinophils Absolute: 0.1 10*3/uL (ref 0.0–0.5)
Eosinophils Relative: 3 %
HCT: 36 % (ref 36.0–46.0)
Hemoglobin: 12.3 g/dL (ref 12.0–15.0)
Immature Granulocytes: 0 %
Lymphocytes Relative: 34 %
Lymphs Abs: 1.4 10*3/uL (ref 0.7–4.0)
MCH: 30.8 pg (ref 26.0–34.0)
MCHC: 34.2 g/dL (ref 30.0–36.0)
MCV: 90 fL (ref 80.0–100.0)
Monocytes Absolute: 0.3 10*3/uL (ref 0.1–1.0)
Monocytes Relative: 6 %
Neutro Abs: 2.3 10*3/uL (ref 1.7–7.7)
Neutrophils Relative %: 56 %
Platelets: 227 10*3/uL (ref 150–400)
RBC: 4 MIL/uL (ref 3.87–5.11)
RDW: 12.6 % (ref 11.5–15.5)
WBC: 4.1 10*3/uL (ref 4.0–10.5)
nRBC: 0 % (ref 0.0–0.2)

## 2019-09-24 LAB — COMPREHENSIVE METABOLIC PANEL
ALT: 130 U/L — ABNORMAL HIGH (ref 0–44)
AST: 57 U/L — ABNORMAL HIGH (ref 15–41)
Albumin: 3.1 g/dL — ABNORMAL LOW (ref 3.5–5.0)
Alkaline Phosphatase: 230 U/L — ABNORMAL HIGH (ref 38–126)
Anion gap: 11 (ref 5–15)
BUN: 11 mg/dL (ref 8–23)
CO2: 23 mmol/L (ref 22–32)
Calcium: 9.4 mg/dL (ref 8.9–10.3)
Chloride: 106 mmol/L (ref 98–111)
Creatinine, Ser: 1.1 mg/dL — ABNORMAL HIGH (ref 0.44–1.00)
GFR calc Af Amer: >60 mL/min (ref >60)
GFR calc non Af Amer: 52 mL/min — ABNORMAL LOW (ref >60)
Glucose, Bld: 94 mg/dL (ref 70–99)
Potassium: 3.4 mmol/L — ABNORMAL LOW (ref 3.5–5.1)
Sodium: 140 mmol/L (ref 135–145)
Total Bilirubin: 4.2 mg/dL — ABNORMAL HIGH (ref 0.3–1.2)
Total Protein: 6.3 g/dL — ABNORMAL LOW (ref 6.5–8.1)

## 2019-09-24 MED ORDER — POTASSIUM CHLORIDE CRYS ER 20 MEQ PO TBCR
40.0000 meq | EXTENDED_RELEASE_TABLET | Freq: Once | ORAL | Status: AC
  Administered 2019-09-24: 40 meq via ORAL
  Filled 2019-09-24: qty 2

## 2019-09-24 NOTE — Progress Notes (Signed)
[name] Young 9:09 AM  Subjective: Patient doing well essentially no pain tolerating diet no obvious post procedure problem  Objective: Vital signs stable afebrile no acute distress abdomen is soft nontender liver test slowly decreasing CBC okay  Assessment: Status post ERCP for CBD stone  Plan: Okay with me to go home and she will follow-up as needed otherwise we will repeat liver tests as an outpatient in 1 week just to make sure they return to normal  [facility]  office [phone] After 5PM or if no answer call [phone]

## 2019-09-24 NOTE — Progress Notes (Signed)
NURSING PROGRESS NOTE  [name] Young [id number] Discharge Data: 09/24/2019 12:15 PM Attending Provider: Domenic Polite, MD [id number], [name] Moll, MD     [name] Young to be D/C'd Home per MD order.  Discussed with the patient the After Visit Summary and all questions fully answered. All IV's discontinued with no bleeding noted. All belongings returned to patient for patient to take home.   Last Vital Signs:  Blood pressure (!) 141/65, pulse (!) 58, temperature 98.2 F (36.8 C), temperature source Oral, resp. rate 18, height 5\' 2"  (1.575 m), weight 77 kg, SpO2 95 %.  Discharge Medication List Allergies as of 09/24/2019      Reactions   Codeine Itching   Latex Dermatitis, Other (See Comments)   Irritates the skin   Mobic [meloxicam] Other (See Comments)   Dizzy/ lightheaded   Tape Dermatitis, Other (See Comments)   Paper tape is preferred      Medication List    TAKE these medications   acetaminophen 500 MG tablet Commonly known as: TYLENOL Take 2 tablets (1,000 mg total) by mouth every 8 (eight) hours as needed for mild pain.   celecoxib 200 MG capsule Commonly known as: CELEBREX Take 200 mg by mouth 2 (two) times daily. Notes to patient: 09/24/2019   HYDROcodone-acetaminophen 5-325 MG tablet Commonly known as: NORCO/VICODIN Take 1 tablet by mouth every 4 (four) hours as needed.   ondansetron 4 MG disintegrating tablet Commonly known as: Zofran ODT Take 1 tablet (4 mg total) by mouth every 6 (six) hours as needed.   pantoprazole 40 MG tablet Commonly known as: PROTONIX Take 1 tablet (40 mg total) by mouth daily. Notes to patient: 09/25/2019   sucralfate 1 g tablet Commonly known as: CARAFATE Take 1 g by mouth 4 (four) times daily. What changed: Another medication with the same name was removed. Continue taking this medication, and follow the directions you see here. Notes to patient: 09/24/2019

## 2019-09-25 ENCOUNTER — Inpatient Hospital Stay (HOSPITAL_COMMUNITY)
Admission: EM | Admit: 2019-09-25 | Discharge: 2019-09-29 | DRG: 378 | Disposition: A | Discharge status: Home / Self Care | Payer: Medicare PPO | Attending: Internal Medicine | Admitting: Internal Medicine

## 2019-09-25 ENCOUNTER — Other Ambulatory Visit: Payer: Self-pay

## 2019-09-25 ENCOUNTER — Encounter (HOSPITAL_COMMUNITY): Payer: Self-pay | Admitting: Emergency Medicine

## 2019-09-25 DIAGNOSIS — N390 Urinary tract infection, site not specified: Secondary | ICD-10-CM

## 2019-09-25 DIAGNOSIS — R945 Abnormal results of liver function studies: Secondary | ICD-10-CM | POA: Diagnosis present

## 2019-09-25 DIAGNOSIS — Z20828 Contact with and (suspected) exposure to other viral communicable diseases: Secondary | ICD-10-CM | POA: Diagnosis present

## 2019-09-25 DIAGNOSIS — Z801 Family history of malignant neoplasm of trachea, bronchus and lung: Secondary | ICD-10-CM

## 2019-09-25 DIAGNOSIS — Z91048 Other nonmedicinal substance allergy status: Secondary | ICD-10-CM | POA: Diagnosis not present

## 2019-09-25 DIAGNOSIS — Z885 Allergy status to narcotic agent status: Secondary | ICD-10-CM | POA: Diagnosis not present

## 2019-09-25 DIAGNOSIS — K219 Gastro-esophageal reflux disease without esophagitis: Secondary | ICD-10-CM | POA: Diagnosis present

## 2019-09-25 DIAGNOSIS — K920 Hematemesis: Secondary | ICD-10-CM | POA: Diagnosis present

## 2019-09-25 DIAGNOSIS — R7989 Other specified abnormal findings of blood chemistry: Secondary | ICD-10-CM | POA: Diagnosis not present

## 2019-09-25 DIAGNOSIS — N179 Acute kidney failure, unspecified: Secondary | ICD-10-CM | POA: Diagnosis present

## 2019-09-25 DIAGNOSIS — Z833 Family history of diabetes mellitus: Secondary | ICD-10-CM | POA: Diagnosis not present

## 2019-09-25 DIAGNOSIS — R17 Unspecified jaundice: Secondary | ICD-10-CM

## 2019-09-25 DIAGNOSIS — N1330 Unspecified hydronephrosis: Secondary | ICD-10-CM

## 2019-09-25 DIAGNOSIS — N809 Endometriosis, unspecified: Secondary | ICD-10-CM | POA: Diagnosis present

## 2019-09-25 DIAGNOSIS — E876 Hypokalemia: Secondary | ICD-10-CM | POA: Diagnosis not present

## 2019-09-25 DIAGNOSIS — Z886 Allergy status to analgesic agent status: Secondary | ICD-10-CM | POA: Diagnosis not present

## 2019-09-25 DIAGNOSIS — R319 Hematuria, unspecified: Secondary | ICD-10-CM | POA: Diagnosis present

## 2019-09-25 DIAGNOSIS — R112 Nausea with vomiting, unspecified: Secondary | ICD-10-CM

## 2019-09-25 DIAGNOSIS — E860 Dehydration: Secondary | ICD-10-CM | POA: Diagnosis present

## 2019-09-25 DIAGNOSIS — K8050 Calculus of bile duct without cholangitis or cholecystitis without obstruction: Secondary | ICD-10-CM | POA: Diagnosis not present

## 2019-09-25 DIAGNOSIS — E785 Hyperlipidemia, unspecified: Secondary | ICD-10-CM | POA: Diagnosis present

## 2019-09-25 DIAGNOSIS — Z91040 Latex allergy status: Secondary | ICD-10-CM

## 2019-09-25 DIAGNOSIS — R7401 Elevation of levels of liver transaminase levels: Secondary | ICD-10-CM | POA: Diagnosis not present

## 2019-09-25 DIAGNOSIS — Z03818 Encounter for observation for suspected exposure to other biological agents ruled out: Secondary | ICD-10-CM | POA: Diagnosis not present

## 2019-09-25 HISTORY — DX: Hypokalemia: E87.6

## 2019-09-25 HISTORY — DX: Urinary tract infection, site not specified: N39.0

## 2019-09-25 HISTORY — DX: Abnormal results of liver function studies: R94.5

## 2019-09-25 LAB — COMPREHENSIVE METABOLIC PANEL
ALT: 139 U/L — ABNORMAL HIGH (ref 0–44)
AST: 73 U/L — ABNORMAL HIGH (ref 15–41)
Albumin: 3.8 g/dL (ref 3.5–5.0)
Alkaline Phosphatase: 198 U/L — ABNORMAL HIGH (ref 38–126)
Anion gap: 11 (ref 5–15)
BUN: 26 mg/dL — ABNORMAL HIGH (ref 8–23)
CO2: 22 mmol/L (ref 22–32)
Calcium: 9.7 mg/dL (ref 8.9–10.3)
Chloride: 105 mmol/L (ref 98–111)
Creatinine, Ser: 1.21 mg/dL — ABNORMAL HIGH (ref 0.44–1.00)
GFR calc Af Amer: 54 mL/min — ABNORMAL LOW (ref >60)
GFR calc non Af Amer: 47 mL/min — ABNORMAL LOW (ref >60)
Glucose, Bld: 130 mg/dL — ABNORMAL HIGH (ref 70–99)
Potassium: 3.2 mmol/L — ABNORMAL LOW (ref 3.5–5.1)
Sodium: 138 mmol/L (ref 135–145)
Total Bilirubin: 3.7 mg/dL — ABNORMAL HIGH (ref 0.3–1.2)
Total Protein: 7.5 g/dL (ref 6.5–8.1)

## 2019-09-25 LAB — CBC
HCT: 39.3 % (ref 36.0–46.0)
Hemoglobin: 13.4 g/dL (ref 12.0–15.0)
MCH: 30.7 pg (ref 26.0–34.0)
MCHC: 34.1 g/dL (ref 30.0–36.0)
MCV: 89.9 fL (ref 80.0–100.0)
Platelets: 296 10*3/uL (ref 150–400)
RBC: 4.37 MIL/uL (ref 3.87–5.11)
RDW: 12.4 % (ref 11.5–15.5)
WBC: 13.7 10*3/uL — ABNORMAL HIGH (ref 4.0–10.5)
nRBC: 0 % (ref 0.0–0.2)

## 2019-09-25 LAB — URINALYSIS, ROUTINE W REFLEX MICROSCOPIC
Bilirubin Urine: NEGATIVE
Glucose, UA: NEGATIVE mg/dL
Ketones, ur: 5 mg/dL — AB
Leukocytes,Ua: NEGATIVE
Nitrite: NEGATIVE
Protein, ur: 30 mg/dL — AB
Specific Gravity, Urine: 1.024 (ref 1.005–1.030)
pH: 5 (ref 5.0–8.0)

## 2019-09-25 LAB — AMMONIA: Ammonia: 18 umol/L (ref 9–35)

## 2019-09-25 LAB — LIPASE, BLOOD: Lipase: 50 U/L (ref 11–51)

## 2019-09-25 MED ORDER — ACETAMINOPHEN 650 MG RE SUPP: 650.0000 mg | Freq: Four times a day (QID) | RECTAL | Status: DC | PRN

## 2019-09-25 MED ORDER — PANTOPRAZOLE SODIUM 40 MG IV SOLR
40.0000 mg | Freq: Once | INTRAVENOUS | Status: AC
  Administered 2019-09-25: 40 mg via INTRAVENOUS
  Filled 2019-09-25: qty 40

## 2019-09-25 MED ORDER — ONDANSETRON HCL 4 MG/2ML IJ SOLN
4.0000 mg | Freq: Once | INTRAMUSCULAR | Status: AC
  Administered 2019-09-25: 23:00:00 4 mg via INTRAVENOUS
  Filled 2019-09-25: qty 2

## 2019-09-25 MED ORDER — SODIUM CHLORIDE 0.9 % IV SOLN
8.0000 mg/h | INTRAVENOUS | Status: DC
  Administered 2019-09-26: 01:00:00 8 mg/h via INTRAVENOUS
  Filled 2019-09-25: qty 80

## 2019-09-25 MED ORDER — ENOXAPARIN SODIUM 40 MG/0.4ML ~~LOC~~ SOLN: 40.0000 mg | SUBCUTANEOUS | Status: DC

## 2019-09-25 MED ORDER — ACETAMINOPHEN 325 MG PO TABS
650.0000 mg | ORAL_TABLET | Freq: Four times a day (QID) | ORAL | Status: DC | PRN
  Administered 2019-09-26: 650 mg via ORAL
  Filled 2019-09-25: qty 2

## 2019-09-25 MED ORDER — PANTOPRAZOLE SODIUM 40 MG IV SOLR
40.0000 mg | Freq: Once | INTRAVENOUS | Status: AC
  Administered 2019-09-25: 40 mg via INTRAVENOUS

## 2019-09-25 MED ORDER — SODIUM CHLORIDE 0.9 % IV BOLUS
1000.0000 mL | Freq: Once | INTRAVENOUS | Status: AC
  Administered 2019-09-25: 23:00:00 1000 mL via INTRAVENOUS

## 2019-09-25 MED ORDER — SODIUM CHLORIDE 0.9% FLUSH
3.0000 mL | Freq: Once | INTRAVENOUS | Status: AC
  Administered 2019-09-28: 3 mL via INTRAVENOUS

## 2019-09-25 MED ORDER — ONDANSETRON HCL 4 MG/2ML IJ SOLN
4.0000 mg | Freq: Four times a day (QID) | INTRAMUSCULAR | Status: DC | PRN
  Administered 2019-09-26 – 2019-09-27 (×2): 4 mg via INTRAVENOUS
  Filled 2019-09-25 (×2): qty 2

## 2019-09-25 MED ORDER — POTASSIUM CHLORIDE IN NACL 20-0.9 MEQ/L-% IV SOLN
INTRAVENOUS | Status: AC
  Administered 2019-09-26: 01:00:00 via INTRAVENOUS
  Filled 2019-09-25: qty 1000

## 2019-09-25 NOTE — H&P (Signed)
TRH H&P    Patient Demographics:    [name] Young, is a 68 y.o. female  MRN: [id number]  DOB - January 06, 1951  Admit Date - 09/25/2019  Referring MD/NP/PA:  Franchot Heidelberg  Outpatient Primary MD for the patient is [name] Juliauna, MD  Patient coming from:  home  Chief complaint- abdominal pain   HPI:    [name] Young  is a 68 y.o. female,  w hyperlipidemia, asthma, endometriosis, s/p lap chole 02/25/2019 w recent admission 09/21/2019 for acute gallstone pancreatitis s/p ERCP w sphincterotomy  09/23/2019  Presents with c/o abdominal pain.  Pt states that the pain is generalized.  N/v started around 7pm,  ? Coffee ground emesis.   Pt denies fever, chills, cough, cp, palp, sob, diarrhea, brbpr.   In ED, T 98.4, P 69, R 16, Bp 150/83  Pox 99% on RA  Na 138, K 3.2 Bun 25, Creatinine 1.21 Ast 73, Alt 139, Alk phos 198. Tbili 3.7 Wbc 13.7, Hgb 13.4, Plt 296 Lipase 50 Urinalysis wbc 6-10, Rbc 21-50  Ammonia 18  ED spoke with [facility] who will consult in AM per ED  Pt will be admitted for abdominal pain, abnormal liver function.            Review of systems:    In addition to the HPI above,  No Fever-chills, No Headache, No changes with Vision or hearing, No problems swallowing food or Liquids, No Chest pain, Cough or Shortness of Breath,  No Blood in stool or Urine, No dysuria, No new skin rashes or bruises, No new joints pains-aches,  No new weakness, tingling, numbness in any extremity, No recent weight gain or loss, No polyuria, polydypsia or polyphagia, No significant Mental Stressors.  All other systems reviewed and are negative.    Past History of the following :    Past Medical History:  Diagnosis Date  . Allergy   . Anemia   . Asthma   . Bronchitis   . Endometriosis   . Hyperlipidemia       Past Surgical History:  Procedure Laterality Date  . CESAREAN SECTION    .  CHOLECYSTECTOMY N/A 02/25/2019   Procedure: LAPAROSCOPIC CHOLECYSTECTOMY;  Surgeon: Clovis Riley, MD;  Location: [facility];  Service: General;  Laterality: N/A;  . ERCP N/A 09/23/2019   Procedure: ENDOSCOPIC RETROGRADE CHOLANGIOPANCREATOGRAPHY (ERCP);  Surgeon: Clarene Essex, MD;  Location: [place];  Service: Endoscopy;  Laterality: N/A;  . laproscopic resection ovrian cyst    . REMOVAL OF STONES  09/23/2019   Procedure: REMOVAL OF STONES;  Surgeon: Clarene Essex, MD;  Location: [facility] ENDOSCOPY;  Service: Endoscopy;;  . [name] Young  09/23/2019   Procedure: [name] Young;  Surgeon: Clarene Essex, MD;  Location: [facility] ENDOSCOPY;  Service: Endoscopy;;  . TUBAL LIGATION        Social History:      Social History   Tobacco Use  . Smoking status: Never Smoker  . Smokeless tobacco: Never Used  Substance Use Topics  . Alcohol use: No    Alcohol/week: 0.0 standard  drinks       Family History :     Family History  Problem Relation Age of Onset  . Arthritis/Rheumatoid Mother   . Osteoarthritis Father   . Lung cancer Father   . Diabetes Paternal Grandmother   . Diabetes Paternal Grandfather        Home Medications:   Prior to Admission medications   Medication Sig Start Date End Date Taking? Authorizing Provider  acetaminophen (TYLENOL) 500 MG tablet Take 2 tablets (1,000 mg total) by mouth every 8 (eight) hours as needed for mild pain. 02/26/19  Yes Rayburn, Claiborne Billings A, PA-C  bismuth subsalicylate (PEPTO BISMOL) 262 MG/15ML suspension Take 30 mLs by mouth every 6 (six) hours as needed for indigestion.   Yes [provider]  celecoxib (CELEBREX) 200 MG capsule Take 200 mg by mouth 2 (two) times daily. 07/20/19  Yes [provider]  HYDROcodone-acetaminophen (NORCO/VICODIN) 5-325 MG tablet Take 1 tablet by mouth every 4 (four) hours as needed. 09/04/19  Yes Ward, Kristen N, DO  ondansetron (ZOFRAN ODT) 4 MG disintegrating tablet Take 1 tablet (4 mg total) by mouth every  6 (six) hours as needed. Patient taking differently: Take 4 mg by mouth every 6 (six) hours as needed for nausea.  09/04/19  Yes Ward, Kristen N, DO  pantoprazole (PROTONIX) 40 MG tablet Take 1 tablet (40 mg total) by mouth daily. 09/04/19  Yes Ward, Cyril Mourning N, DO  sucralfate (CARAFATE) 1 g tablet Take 1 g by mouth 4 (four) times daily. 09/04/19  Yes [provider]     Allergies:     Allergies  Allergen Reactions  . Codeine Itching  . Latex Dermatitis and Other (See Comments)    Irritates the skin  . Mobic [Meloxicam] Other (See Comments)    Dizzy/ lightheaded  . Tape Dermatitis and Other (See Comments)    Paper tape is preferred      Physical Exam:   Vitals  Blood pressure (!) 150/83, pulse 69, temperature 99.1 F (37.3 C), temperature source Oral, resp. rate 16, SpO2 99 %.  1.  General: axoxo3  2. Psychiatric: euthymic  3. Neurologic: cn2-12 intact, reflexes 2+ symmetric, diffuse with no clonus, motor 5/5 in all 4 ext  4. HEENMT:  Icteric, pupils 1.13m symmetric, direct, consensual intact Neck: no jvd  5. Respiratory : CTAB, anterior auscultation  6. Cardiovascular : rrr s1, s2  7. Gastrointestinal:  Abd: soft, nt, nd, +bs  8. Skin:  Ext: no c/c/e,  No rash  9.Musculoskeletal:  Good ROM    Data Review:    CBC Recent Labs  Lab 09/21/19 1206 09/22/19 0949 09/23/19 0332 09/24/19 0542 09/25/19 1937  WBC 6.3 4.2 4.0 4.1 13.7*  HGB 14.8 13.7 12.1 12.3 13.4  HCT 44.1 40.5 35.7* 36.0 39.3  PLT 306 244 236 227 296  MCV 90.6 91.8 89.9 90.0 89.9  MCH 30.4 31.1 30.5 30.8 30.7  MCHC 33.6 33.8 33.9 34.2 34.1  RDW 13.0 13.2 12.9 12.6 12.4  LYMPHSABS  --  1.1  --  1.4  --   MONOABS  --  0.3  --  0.3  --   EOSABS  --  0.2  --  0.1  --   BASOSABS  --  0.0  --  0.0  --    ------------------------------------------------------------------------------------------------------------------  Results for orders placed or performed during the  hospital encounter of 09/25/19 (from the past 48 hour(s))  Urinalysis, Routine w reflex microscopic     Status: Abnormal  Collection Time: 09/25/19  7:12 PM  Result Value Ref Range   Color, Urine AMBER (A) YELLOW    Comment: BIOCHEMICALS MAY BE AFFECTED BY COLOR   APPearance CLEAR CLEAR   Specific Gravity, Urine 1.024 1.005 - 1.030   pH 5.0 5.0 - 8.0   Glucose, UA NEGATIVE NEGATIVE mg/dL   Hgb urine dipstick LARGE (A) NEGATIVE   Bilirubin Urine NEGATIVE NEGATIVE   Ketones, ur 5 (A) NEGATIVE mg/dL   Protein, ur 30 (A) NEGATIVE mg/dL   Nitrite NEGATIVE NEGATIVE   Leukocytes,Ua NEGATIVE NEGATIVE   RBC / HPF 21-50 0 - 5 RBC/hpf   WBC, UA 6-10 0 - 5 WBC/hpf   Bacteria, UA RARE (A) NONE SEEN   Squamous Epithelial / LPF 0-5 0 - 5   Mucus PRESENT     Comment: Performed at [facility], [address], [place], [place] [address]  Lipase, blood     Status: None   Collection Time: 09/25/19  7:37 PM  Result Value Ref Range   Lipase 50 11 - 51 U/L    Comment: Performed at [facility], [place] [address] [place], [place] [address]  Comprehensive metabolic panel     Status: Abnormal   Collection Time: 09/25/19  7:37 PM  Result Value Ref Range   Sodium 138 135 - 145 mmol/L   Potassium 3.2 (L) 3.5 - 5.1 mmol/L   Chloride 105 98 - 111 mmol/L   CO2 22 22 - 32 mmol/L   Glucose, Bld 130 (H) 70 - 99 mg/dL   BUN 26 (H) 8 - 23 mg/dL   Creatinine, Ser 1.21 (H) 0.44 - 1.00 mg/dL   Calcium 9.7 8.9 - 10.3 mg/dL   Total Protein 7.5 6.5 - 8.1 g/dL   Albumin 3.8 3.5 - 5.0 g/dL   AST 73 (H) 15 - 41 U/L   ALT 139 (H) 0 - 44 U/L   Alkaline Phosphatase 198 (H) 38 - 126 U/L   Total Bilirubin 3.7 (H) 0.3 - 1.2 mg/dL   GFR calc non Af Amer 47 (L) >60 mL/min   GFR calc Af Amer 54 (L) >60 mL/min   Anion gap 11 5 - 15    Comment: Performed at [facility], [place] [address], [place], [place] [address]  CBC     Status: Abnormal   Collection Time: 09/25/19  7:37 PM  Result Value Ref Range   WBC  13.7 (H) 4.0 - 10.5 K/uL   RBC 4.37 3.87 - 5.11 MIL/uL   Hemoglobin 13.4 12.0 - 15.0 g/dL   HCT 39.3 36.0 - 46.0 %   MCV 89.9 80.0 - 100.0 fL   MCH 30.7 26.0 - 34.0 pg   MCHC 34.1 30.0 - 36.0 g/dL   RDW 12.4 11.5 - 15.5 %   Platelets 296 150 - 400 K/uL   nRBC 0.0 0.0 - 0.2 %    Comment: Performed at [facility], [place] [address] [place], [place] [address]    Chemistries  Recent Labs  Lab 09/21/19 1206 09/22/19 0949 09/23/19 0332 09/24/19 0542 09/25/19 1937  NA 139 142 143 140 138  K 3.5 3.5 3.0* 3.4* 3.2*  CL 101 106 108 106 105  CO2 '25 24 24 23 22  ' GLUCOSE 121* 103* 89 94 130*  BUN '14 13 11 11 ' 26*  CREATININE 0.82 0.78 0.99 1.10* 1.21*  CALCIUM 9.7 9.5 9.2 9.4 9.7  AST 144* 107* 71* 57* 73*  ALT  245* 193* 148* 130* 139*  ALKPHOS 332* 291* 253* 230* 198*  BILITOT 9.9* 7.3* 5.0* 4.2* 3.7*   ------------------------------------------------------------------------------------------------------------------  ------------------------------------------------------------------------------------------------------------------ GFR: Estimated Creatinine Clearance: 44 mL/min (A) (by C-G formula based on SCr of 1.21 mg/dL (H)). Liver Function Tests: Recent Labs  Lab 09/21/19 1206 09/22/19 0949 09/23/19 0332 09/24/19 0542 09/25/19 1937  AST 144* 107* 71* 57* 73*  ALT 245* 193* 148* 130* 139*  ALKPHOS 332* 291* 253* 230* 198*  BILITOT 9.9* 7.3* 5.0* 4.2* 3.7*  PROT 7.4 6.4* 5.9* 6.3* 7.5  ALBUMIN 4.1 3.4* 3.0* 3.1* 3.8   Recent Labs  Lab 09/20/19 1808 09/21/19 1206 09/22/19 0949 09/23/19 0332 09/25/19 1937  LIPASE 2,976* 3,204* 384* 139* 50   No results for input(s): AMMONIA in the last 168 hours. Coagulation Profile: No results for input(s): INR, PROTIME in the last 168 hours. Cardiac Enzymes: No results for input(s): CKTOTAL, CKMB, CKMBINDEX, TROPONINI in the last 168 hours. BNP (last 3 results) No results for input(s): PROBNP in the last 8760  hours. HbA1C: No results for input(s): HGBA1C in the last 72 hours. CBG: Recent Labs  Lab 09/23/19 0627  GLUCAP 95   Lipid Profile: No results for input(s): CHOL, HDL, LDLCALC, TRIG, CHOLHDL, LDLDIRECT in the last 72 hours. Thyroid Function Tests: No results for input(s): TSH, T4TOTAL, FREET4, T3FREE, THYROIDAB in the last 72 hours. Anemia Panel: No results for input(s): VITAMINB12, FOLATE, FERRITIN, TIBC, IRON, RETICCTPCT in the last 72 hours.  --------------------------------------------------------------------------------------------------------------- Urine analysis:    Component Value Date/Time   COLORURINE AMBER (A) 09/25/2019 1912   APPEARANCEUR CLEAR 09/25/2019 1912   LABSPEC 1.024 09/25/2019 1912   PHURINE 5.0 09/25/2019 1912   GLUCOSEU NEGATIVE 09/25/2019 1912   HGBUR LARGE (A) 09/25/2019 1912   BILIRUBINUR NEGATIVE 09/25/2019 1912   KETONESUR 5 (A) 09/25/2019 1912   PROTEINUR 30 (A) 09/25/2019 1912   UROBILINOGEN 1.0 07/29/2015 0209   NITRITE NEGATIVE 09/25/2019 1912   LEUKOCYTESUR NEGATIVE 09/25/2019 1912      Imaging Results:    No results found.     Assessment & Plan:    Principal Problem:   Nausea & vomiting Active Problems:   Abnormal liver function   Hypokalemia   Acute lower UTI  N/v ? Coffee ground emesis ? pepto CXR NPO  Ns iv protonix 19m iv x1, protonix 863m hr Zofran [id number] q6h prn  Check cbc in am GI consulted by ED, appreciate input  Abdominal pain Check CT abd/ pelvis  Abnormal liver function Check acute hepatitis panel Check cmp in am  Hypokalemia Replete Check cmp in am  Acute lower uti Urine culture Rocephin 1gm iv qday   DVT Prophylaxis-    SCDs   AM Labs Ordered, also please review Full Orders  Family Communication: Admission, patients condition and plan of care including tests being ordered have been discussed with the patient  who indicate understanding and agree with the plan and Code Status.  Code  Status:  FULL CODE per patient, [facility] for [facility] Observation: Based on patients clinical presentation and evaluation of above clinical data, I have made determination that patient meets observation criteria at this time.   Time spent in minutes : 70    JamJani GravelD on 09/25/2019 at 11:38 PM

## 2019-09-25 NOTE — ED Notes (Signed)
No answer in waiting room 

## 2019-09-25 NOTE — ED Provider Notes (Signed)
[place] EMERGENCY DEPARTMENT Provider Note   CSN: [id number] Arrival date & time: 09/25/19  1756     History Chief Complaint  Patient presents with  . Abdominal Pain  . Emesis    [name] Young is a 68 y.o. female presenting for evaluation of nausea and vomiting.  Patient states she was admitted 5 days ago for gallbladder blockage.  She was discharged yesterday at noon, was feeling well that time.  She had dinner around 530, and when she had a few bites of chicken.  Starting around 7 PM, she started to have persistent vomiting.  She vomited more times than she can count.  She states she started to vomit what appeared to be coffee-ground emesis.  She has not vomited in the past several hours, but this is due to her not having any p.o. intake.  She did take a single Pepto-Bismol pill without improvement of symptoms.  She does not currently have any medicine for nausea.  She denies fevers, chills, chest pain, shortness of breath, cough, urinary symptoms.  Patient states her stool does appear darker today than normal.  She denies abdominal pain, but does describe she is having intermittent and migratory burning pain.  Patient's husband states she has been "out of it," but cannot describe further confusion.  Additional history obtained from chart review.  Patient was recently admitted for choledocholithiasis status post ERCP.  No stent was placed.  Discharge note from yesterday shows that patient was feeling better and her LFTs and bili were trending down.  Additional history of anemia, endometriosis, hyperlipidemia.    HPI     Past Medical History:  Diagnosis Date  . Allergy   . Anemia   . Asthma   . Bronchitis   . Endometriosis   . Hyperlipidemia     Patient Active Problem List   Diagnosis Date Noted  . Choledocholithiasis   . Biliary obstruction 09/21/2019  . Pancreatitis 09/21/2019  . Cholecystitis with cholelithiasis 02/24/2019  . Seasonal allergies  10/30/2012    Past Surgical History:  Procedure Laterality Date  . CESAREAN SECTION    . CHOLECYSTECTOMY N/A 02/25/2019   Procedure: LAPAROSCOPIC CHOLECYSTECTOMY;  Surgeon: Clovis Riley, MD;  Location: [facility];  Service: General;  Laterality: N/A;  . ERCP N/A 09/23/2019   Procedure: ENDOSCOPIC RETROGRADE CHOLANGIOPANCREATOGRAPHY (ERCP);  Surgeon: Clarene Essex, MD;  Location: [place];  Service: Endoscopy;  Laterality: N/A;  . laproscopic resection ovrian cyst    . REMOVAL OF STONES  09/23/2019   Procedure: REMOVAL OF STONES;  Surgeon: Clarene Essex, MD;  Location[facility] ENDOSCOPY;  Service: Endoscopy;;  . Joan Mayans  09/23/2019   Procedure: Joan Mayans;  Surgeon: Clarene Essex, MD;  Location: [facility] ENDOSCOPY;  Service: Endoscopy;;  . TUBAL LIGATION       OB History   No obstetric history on file.     Family History  Problem Relation Age of Onset  . Arthritis/Rheumatoid Mother   . Osteoarthritis Father   . Lung cancer Father   . Diabetes Paternal Grandmother   . Diabetes Paternal Grandfather     Social History   Tobacco Use  . Smoking status: Never Smoker  . Smokeless tobacco: Never Used  Substance Use Topics  . Alcohol use: No    Alcohol/week: 0.0 standard drinks  . Drug use: No    Home Medications Prior to Admission medications   Medication Sig Start Date End Date Taking? Authorizing Provider  acetaminophen (TYLENOL) 500 MG tablet Take 2 tablets (  1,000 mg total) by mouth every 8 (eight) hours as needed for mild pain. 02/26/19   Rayburn, Alphonsus SiasKelly A, PA-C  celecoxib (CELEBREX) 200 MG capsule Take 200 mg by mouth 2 (two) times daily. 07/20/19   [provider]  HYDROcodone-acetaminophen (NORCO/VICODIN) 5-325 MG tablet Take 1 tablet by mouth every 4 (four) hours as needed. 09/04/19   Ward, Layla MawKristen N, DO  ondansetron (ZOFRAN ODT) 4 MG disintegrating tablet Take 1 tablet (4 mg total) by mouth every 6 (six) hours as needed. 09/04/19   Ward, Layla MawKristen N, DO   pantoprazole (PROTONIX) 40 MG tablet Take 1 tablet (40 mg total) by mouth daily. 09/04/19   Ward, Layla MawKristen N, DO  sucralfate (CARAFATE) 1 g tablet Take 1 g by mouth 4 (four) times daily. 09/04/19   [provider]    Allergies    Codeine, Latex, Mobic [meloxicam], and Tape  Review of Systems   Review of Systems  Gastrointestinal: Positive for nausea and vomiting.  All other systems reviewed and are negative.   Physical Exam Updated Vital Signs BP (!) 150/83 (BP Location: Right Arm)   Pulse 69   Temp 99.1 F (37.3 C) (Oral)   Resp 16   SpO2 99%   Physical Exam Vitals and nursing note reviewed.  Constitutional:      General: She is not in acute distress.    Appearance: She is well-developed.     Comments: Appears tired and dehydrated, but nontoxic  HENT:     Head: Normocephalic and atraumatic.  Eyes:     General: No scleral icterus.    Conjunctiva/sclera: Conjunctivae normal.     Pupils: Pupils are equal, round, and reactive to light.     Comments: Mild scleral icterus  Cardiovascular:     Rate and Rhythm: Normal rate and regular rhythm.     Pulses: Normal pulses.  Pulmonary:     Effort: Pulmonary effort is normal. No respiratory distress.     Breath sounds: Normal breath sounds. No wheezing.  Abdominal:     General: There is no distension.     Palpations: Abdomen is soft. There is no mass.     Tenderness: There is no abdominal tenderness. There is no guarding or rebound.     Comments: No abdominal tenderness.  Contusion of the right lower quadrant, likely due to Lovenox injections while she was admitted.  No rigidity, guarding, distention.  Negative rebound.  Negative Murphy's.  Musculoskeletal:        General: Normal range of motion.     Cervical back: Normal range of motion and neck supple.  Skin:    General: Skin is warm and dry.     Capillary Refill: Capillary refill takes less than 2 seconds.  Neurological:     Mental Status: She is alert and oriented  to person, place, and time.     Comments: Patient is sleepy, however able to answer all questions appropriately.     ED Results / Procedures / Treatments   Labs (all labs ordered are listed, but only abnormal results are displayed) Labs Reviewed  COMPREHENSIVE METABOLIC PANEL - Abnormal; Notable for the following components:      Result Value   Potassium 3.2 (*)    Glucose, Bld 130 (*)    BUN 26 (*)    Creatinine, Ser 1.21 (*)    AST 73 (*)    ALT 139 (*)    Alkaline Phosphatase 198 (*)    Total Bilirubin 3.7 (*)  GFR calc non Af Amer 47 (*)    GFR calc Af Amer 54 (*)    All other components within normal limits  CBC - Abnormal; Notable for the following components:   WBC 13.7 (*)    All other components within normal limits  URINALYSIS, ROUTINE W REFLEX MICROSCOPIC - Abnormal; Notable for the following components:   Color, Urine AMBER (*)    Hgb urine dipstick LARGE (*)    Ketones, ur 5 (*)    Protein, ur 30 (*)    Bacteria, UA RARE (*)    All other components within normal limits  SARS CORONAVIRUS 2 (TAT 6-24 HRS)  LIPASE, BLOOD  AMMONIA    EKG None  Radiology No results found.  Procedures Procedures (including critical care time)  Medications Ordered in ED Medications  sodium chloride flush (NS) 0.9 % injection 3 mL (has no administration in time range)  sodium chloride 0.9 % bolus 1,000 mL (1,000 mLs Intravenous New Bag/Given 09/25/19 2248)  ondansetron (ZOFRAN) injection 4 mg (4 mg Intravenous Given 09/25/19 2244)  pantoprazole (PROTONIX) injection 40 mg (40 mg Intravenous Given 09/25/19 2247)    ED Course  I have reviewed the triage vital signs and the nursing notes.  Pertinent labs & imaging results that were available during my care of the patient were reviewed by me and considered in my medical decision making (see chart for details).    MDM Rules/Calculators/A&P     CHA2DS2/VAS Stroke Risk Points      N/A >= 2 Points: High Risk  1 - 1.99  Points: Medium Risk  0 Points: Low Risk    A final score could not be computed because of missing components.: Last  Change: N/A     This score determines the patient's risk of having a stroke if the  patient has atrial fibrillation.      This score is not applicable to this patient. Components are not  calculated.                   Patient presenting for evaluation of persistent nausea and vomiting.  Physical exam shows patient appears tired and slightly confused, but nontoxic.  Labs obtained from triage show leukocytosis of 13.7.  Otherwise labs are pretty similar to what they were yesterday at discharge.  Slight elevation in LFTs and bili.  Creatinine and BUN slightly elevated, likely due to dehydration from vomiting.  Lipase is normal.  Will treat with Protonix, Zofran, and fluids.  Will add on ammonia due to family's report of confusion, although will likely be normal.  Will consult with GI to discuss dispo and need for further imaging.  Discussed with Dr. Bosie Clos from [facility] who recommends no further imaging at this time.  Recommend symptomatic control, admission overnight, and they will evaluate the patient in the morning.  Discussed with Dr. Selena Batten from [facility], patient to be admitted.  Final Clinical Impression(s) / ED Diagnoses Final diagnoses:  Non-intractable vomiting with nausea, unspecified vomiting type  Dehydration  Elevated LFTs  Elevated bilirubin    Rx / DC Orders ED Discharge Orders    None       Alveria Apley, PA-C 09/26/19 0033    Milagros Loll, MD 09/26/19 [phone]

## 2019-09-25 NOTE — Discharge Summary (Signed)
Physician Discharge Summary  [name] Young EAV:[id number] DOB: [birth year] DOA: 09/21/2019  PCP: Renford Dills, MD  Admit date: 09/21/2019 Discharge date: 09/24/2019  Time spent: 35 minutes  Recommendations for Outpatient Follow-up:  PCP in 1 week, please check LFTs at follow-up  Discharge Diagnoses:  Acute gallstone pancreatitis   Biliary obstruction   Pancreatitis   Choledocholithiasis GERD  Discharge Condition: Improved  Diet recommendation: Heart healthy  Filed Weights   09/21/19 1156 09/23/19 0717  Weight: 77 kg 77 kg    History of present illness:  68 year old female with history of asthma, bronchitis, dyslipidemia, endometriosis underwent laparoscopic cholecystectomy in May subsequently she had intermittent episodes of upper abdominal pain presented to the emergency room 12/8 with dull constant persistent epigastric and right-sided abdominal pain radiating to the back, in the ED she was noted to have abnormal LFTs, lipase of 3000, CT abdomen pelvis noted biliary dilation -Admitted with acute gallstone pancreatitis, choledocholithiasis  Hospital Course:   Gallstone pancreatitis/choledocholithiasis -History of lap cholecystectomy in 02/2019 -Admitted with epigastric/right-sided abdominal pain, elevated lipase and LFTs, MRCP noted retained CBD stone near ampulla causing obstruction,  -Underwent ERCP with sphincterotomy, stone extraction yesterday, no stents placed -Treated with empiric antibiotics during this time -Clinically improved, tolerating soft diet at this time -Seen by gastroenterology and cleared for discharge home today, needs repeat LFTs in 1 week  GERD -Continue PPI  Discharge Exam: Vitals:   09/23/19 2300 09/24/19 0500  BP: (!) 141/74 (!) 141/65  Pulse: (!) 52 (!) 58  Resp: 18 18  Temp: 97.7 F (36.5 C) 98.2 F (36.8 C)  SpO2: 96% 95%    General: AAOx3 Cardiovascular: S1S2/RRR Respiratory: CTAB  Discharge Instructions   Discharge  Instructions    Diet - low sodium heart healthy   Complete by: As directed    Increase activity slowly   Complete by: As directed      Allergies as of 09/24/2019      Reactions   Codeine Itching   Latex Dermatitis, Other (See Comments)   Irritates the skin   Mobic [meloxicam] Other (See Comments)   Dizzy/ lightheaded   Tape Dermatitis, Other (See Comments)   Paper tape is preferred      Medication List    TAKE these medications   acetaminophen 500 MG tablet Commonly known as: TYLENOL Take 2 tablets (1,000 mg total) by mouth every 8 (eight) hours as needed for mild pain.   celecoxib 200 MG capsule Commonly known as: CELEBREX Take 200 mg by mouth 2 (two) times daily. Notes to patient: 09/24/2019   HYDROcodone-acetaminophen 5-325 MG tablet Commonly known as: NORCO/VICODIN Take 1 tablet by mouth every 4 (four) hours as needed.   ondansetron 4 MG disintegrating tablet Commonly known as: Zofran ODT Take 1 tablet (4 mg total) by mouth every 6 (six) hours as needed.   pantoprazole 40 MG tablet Commonly known as: PROTONIX Take 1 tablet (40 mg total) by mouth daily. Notes to patient: 09/25/2019   sucralfate 1 g tablet Commonly known as: CARAFATE Take 1 g by mouth 4 (four) times daily. What changed: Another medication with the same name was removed. Continue taking this medication, and follow the directions you see here. Notes to patient: 09/24/2019      Allergies  Allergen Reactions  . Codeine Itching  . Latex Dermatitis and Other (See Comments)    Irritates the skin  . Mobic [Meloxicam] Other (See Comments)    Dizzy/ lightheaded  . Tape Dermatitis and Other (See Comments)  Paper tape is preferred    Follow-up Information    Polite, Windy Fast, MD. Schedule an appointment as soon as possible for a visit in 1 week(s).   Specialty: Internal Medicine Why: need repeat labs-Liver function at follow up Contact information: [address] [place]  [address] [phone]        Vida Rigger, MD Follow up in 1 month(s).   Specialty: Gastroenterology Why: as needed Contact information: [address] [place] [address] [phone]            The results of significant diagnostics from this hospitalization (including imaging, microbiology, ancillary and laboratory) are listed below for reference.    Significant Diagnostic Studies: DG Chest 2 View  Result Date: 09/04/2019 CLINICAL DATA:  Chest and epigastric pain. Shortness of breath. EXAM: CHEST - 2 VIEW COMPARISON:  12/22/2015 FINDINGS: The cardiomediastinal contours are normal. Bronchial thickening, new from prior. Mild hyperinflation. Pulmonary vasculature is normal. No consolidation, pleural effusion, or pneumothorax. No acute osseous abnormalities are seen. IMPRESSION: Bronchial thickening and mild hyperinflation, new from prior, may be bronchitis or asthma. Electronically Signed   By: Narda Rutherford M.D.   On: 09/04/2019 00:40   CT ABDOMEN PELVIS W CONTRAST  Result Date: 09/20/2019 CLINICAL DATA:  Burning sensation in the abdomen with increased liver function studies and elevated bilirubin. Possible obstructive jaundice. EXAM: CT ABDOMEN AND PELVIS WITH CONTRAST TECHNIQUE: Multidetector CT imaging of the abdomen and pelvis was performed using the standard protocol following bolus administration of intravenous contrast. CONTRAST:  ISOVUE-300 IOPAMIDOL (ISOVUE-300) INJECTION 61% COMPARISON:  None. FINDINGS: Lower chest: The lung bases are clear of an acute process. No pleural effusions are identified. The heart is normal in size. No pericardial effusion. Hepatobiliary: No focal hepatic lesions are identified. There is intra and extrahepatic biliary dilatation. The common bile duct in the porta hepatis measures a maximum of 13 mm and measures a maximum of 9.5 mm in the head of the pancreas. It does taper down to the ampulla. There could be a distal stricture.  I do not see any definite obstructing common bile duct stones and no obvious ampullary lesion or pancreatic head mass. The gallbladder is surgically absent. History of cholecystectomy in May 2020. Pancreas: No mass or inflammation. Normal caliber and course of the main pancreatic duct. The distal duct is slightly dilated measuring 4 mm in the head of the pancreas. Spleen: Normal size. Small scattered lesions on the portal venous phase images are not persistent on the delayed images and may be due to white pulp red pulp differentiation. Adrenals/Urinary Tract: Adrenal glands and kidneys are unremarkable. There is a simple right renal cyst noted. No hydronephrosis. The bladder is unremarkable. Stomach/Bowel: The stomach, duodenum, small bowel and colon are grossly normal without oral contrast. No inflammatory changes, mass lesions or obstructive findings. The terminal ileum and appendix are normal. Scattered colonic diverticulosis but no findings for acute diverticulitis. Vascular/Lymphatic: Scattered aortic calcifications without aneurysm or dissection. The branch vessels are patent. The major venous structures are patent. No mesenteric or retroperitoneal mass or adenopathy. Reproductive: The uterus and ovaries are unremarkable. Other: No pelvic mass or pelvic adenopathy. No free pelvic fluid collections. No inguinal mass or adenopathy. Musculoskeletal: L3 hemangioma noted. IMPRESSION: 1. Intra and extrahepatic biliary dilatation without obvious cause. No definite common bile duct stone, ampullary lesion or pancreatic head mass. Post cholecystectomy dilatation is unlikely given the recent time course of the cholecystectomy. A distal stricture is possible.  Recommend ERCP or MRCP (without and with contrast) for further evaluation. 2. No hepatic lesions. 3. No abdominal or pelvic mass or adenopathy. 4. Moderate scattered colonic diverticulosis but no findings for acute diverticulitis. Electronically Signed   By: Rudie MeyerP.   Gallerani M.D.   On: 09/20/2019 14:30   DG ERCP sphincterotomy  Result Date: 09/23/2019 CLINICAL DATA:  Jaundice, choledocholithiasis EXAM: ERCP TECHNIQUE: Multiple spot images obtained with the fluoroscopic device and submitted for interpretation post-procedure. COMPARISON:  MRCP 09/22/2019 FINDINGS: A series of fluoroscopic spot images document endoscopic cannulation and opacification of the CBD with passage of a balloon tipped catheter. There is incomplete opacification of the intrahepatic biliary tree which appears ectatic centrally. IMPRESSION: Endoscopic CBD cannulation and intervention. These images were submitted for radiologic interpretation only. Please see the procedural report for the amount of contrast and the fluoroscopy time utilized. Electronically Signed   By: Corlis Leak  Hassell M.D.   On: 09/23/2019 08:45   MR ABDOMEN MRCP W WO CONTAST  Result Date: 09/22/2019 CLINICAL DATA:  Abnormal liver function tests. Pancreatitis. Possible biliary obstruction. EXAM: MRI ABDOMEN WITHOUT AND WITH CONTRAST (INCLUDING MRCP) TECHNIQUE: Multiplanar multisequence MR imaging of the abdomen was performed both before and after the administration of intravenous contrast. Heavily T2-weighted images of the biliary and pancreatic ducts were obtained, and three-dimensional MRCP images were rendered by post processing. CONTRAST:  7.717mL GADAVIST GADOBUTROL 1 MMOL/ML IV SOLN COMPARISON:  CT abdomen 09/20/2019 FINDINGS: Lower chest: Unremarkable Hepatobiliary: Prior cholecystectomy. Common bile duct measures up to 1.3 cm in diameter there is mild to moderate intrahepatic biliary dilatation. 4 mm distal CBD stone near the ampulla on image 10/22 is the likely cause. Subtle accentuated enhancement of the walls of the distal CBD. No significant focal hepatic lesion is identified. Although partially obscured by adjacent metal artifact from cholecystectomy clips, the complex appearance on image 18/4 raises suspicion for additional  small stones in the cystic duct remnant. Pancreas: Subtle pancreatic/peripancreatic edema may reflect acute pancreatitis. No abscess, pseudocyst, pancreatic necrosis identified. Spleen: Several indistinctly marginated hypoechoic lesions are present in the spleen on arterial phase images, such as the 1.7 by 1.1 cm anterior splenic lesion on image 15/31. 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 Adrenals/Urinary Tract: Dominant Bosniak category 1 2.8 cm nonenhancing fluid signal intensity lesion compatible with cyst. Two additional small T2 hyperintense lesions are present in the right mid kidney on image 18/4 and are likewise probably cysts. Adrenal glands unremarkable. Stomach/Bowel: Descending colon diverticulosis. Vascular/Lymphatic:  Unremarkable Other:  No supplemental non-categorized findings. Musculoskeletal: Small hemangioma eccentric to the left in the L3 vertebral body. IMPRESSION: 1. 4 mm distal CBD stone near the ampulla, causing mild to moderate intrahepatic and extrahepatic biliary dilatation. Probable additional small stones in the cystic duct remnant for example on image 18/4. 2. Subtle pancreatic/peripancreatic edema may reflect acute pancreatitis. No abscess, pseudocyst, or pancreatic necrosis identified. 3. Bosniak category 1 cyst of the left kidney. 4. Descending colon diverticulosis. Electronically Signed   By: Gaylyn RongWalter  Liebkemann M.D.   On: 09/22/2019 07:42    Microbiology: Recent Results (from the past 240 hour(s))  Respiratory Panel by RT PCR (Flu A&B, Covid) - Nasopharyngeal Swab     Status: None   Collection Time: 09/21/19  5:32 PM   Specimen: Nasopharyngeal Swab  Result Value Ref Range Status   SARS Coronavirus 2 by RT PCR NEGATIVE NEGATIVE Final    Comment: (NOTE) SARS-CoV-2 target nucleic acids are NOT DETECTED. The SARS-CoV-2  RNA is generally detectable in upper respiratoy specimens during the acute phase of infection.  The lowest concentration of SARS-CoV-2 viral copies this assay can detect is 131 copies/mL. A negative result does not preclude SARS-Cov-2 infection and should not be used as the sole basis for treatment or other patient management decisions. A negative result may occur with  improper specimen collection/handling, submission of specimen other than nasopharyngeal swab, presence of viral mutation(s) within the areas targeted by this assay, and inadequate number of viral copies (<131 copies/mL). A negative result must be combined with clinical observations, patient history, and epidemiological information. The expected result is Negative. Fact Sheet for Patients:  PinkCheek.be Fact Sheet for Healthcare Providers:  GravelBags.it This test is not yet ap proved or cleared by the [place] FDA and  has been authorized for detection and/or diagnosis of SARS-CoV-2 by FDA under an Emergency Use Authorization (EUA). This EUA will remain  in effect (meaning this test can be used) for the duration of the COVID-19 declaration under Section 564(b)(1) of the Act, 21 U.S.C. section 360bbb-3(b)(1), unless the authorization is terminated or revoked sooner.    Influenza A by PCR NEGATIVE NEGATIVE Final   Influenza B by PCR NEGATIVE NEGATIVE Final    Comment: (NOTE) The Xpert Xpress SARS-CoV-2/FLU/RSV assay is intended as an aid in  the diagnosis of influenza from Nasopharyngeal swab specimens and  should not be used as a sole basis for treatment. Nasal washings and  aspirates are unacceptable for Xpert Xpress SARS-CoV-2/FLU/RSV  testing. Fact Sheet for Patients: PinkCheek.be Fact Sheet for Healthcare Providers: GravelBags.it This test is not yet approved or cleared by the [place] FDA and  has been authorized for detection and/or diagnosis of SARS-CoV-2 by  FDA under an  Emergency Use Authorization (EUA). This EUA will remain  in effect (meaning this test can be used) for the duration of the  Covid-19 declaration under Section 564(b)(1) of the Act, 21  U.S.C. section 360bbb-3(b)(1), unless the authorization is  terminated or revoked. Performed at [facility], [place] [address] [place], [place] [address]   Surgical pcr screen     Status: Abnormal   Collection Time: 09/22/19  7:50 PM   Specimen: Nasal Mucosa; Nasal Swab  Result Value Ref Range Status   MRSA, PCR POSITIVE (A) NEGATIVE Final   Staphylococcus aureus POSITIVE (A) NEGATIVE Final    Comment: RESULT CALLED TO, READ BACK BY AND VERIFIED WITH: E . OFORI RN 09/22/19 2139 JDW (NOTE) The Xpert SA Assay (FDA approved for NASAL specimens in patients 57 years of age and older), is one component of a comprehensive surveillance program. It is not intended to diagnose infection nor to guide or monitor treatment. Performed at [facility], [place] [address], [place], [place] [address]      Labs: Basic Metabolic Panel: Recent Labs  Lab 09/20/19 1808 09/21/19 1206 09/22/19 0949 09/23/19 0332 09/24/19 0542  NA 140 139 142 143 140  K 3.1* 3.5 3.5 3.0* 3.4*  CL 103 101 106 108 106  CO2 25 25 24 24 23   GLUCOSE 97 121* 103* 89 94  BUN 10 14 13 11 11   CREATININE 0.86 0.82 0.78 0.99 1.10*  CALCIUM 9.8 9.7 9.5 9.2 9.4   Liver Function Tests: Recent Labs  Lab 09/20/19 1808 09/21/19 1206 09/22/19 0949 09/23/19 0332 09/24/19 0542  AST 135* 144* 107* 71* 57*  ALT 255* 245* 193* 148* 130*  ALKPHOS 302* 332* 291* 253* 230*  BILITOT  9.1* 9.9* 7.3* 5.0* 4.2*  PROT 7.4 7.4 6.4* 5.9* 6.3*  ALBUMIN 3.8 4.1 3.4* 3.0* 3.1*   Recent Labs  Lab 09/20/19 1808 09/21/19 1206 09/22/19 0949 09/23/19 0332  LIPASE 2,976* 3,204* 384* 139*   No results for input(s): AMMONIA in the last 168 hours. CBC: Recent Labs  Lab 09/20/19 1808 09/21/19 1206 09/22/19 0949 09/23/19 0332 09/24/19 0542   WBC 6.7 6.3 4.2 4.0 4.1  NEUTROABS  --   --  2.6  --  2.3  HGB 14.6 14.8 13.7 12.1 12.3  HCT 42.2 44.1 40.5 35.7* 36.0  MCV 89.6 90.6 91.8 89.9 90.0  PLT 275 306 244 236 227   Cardiac Enzymes: No results for input(s): CKTOTAL, CKMB, CKMBINDEX, TROPONINI in the last 168 hours. BNP: BNP (last 3 results) No results for input(s): BNP in the last 8760 hours.  ProBNP (last 3 results) No results for input(s): PROBNP in the last 8760 hours.  CBG: Recent Labs  Lab 09/23/19 0627  GLUCAP 95   Signed:  Zannie Cove MD.  [facility] 09/25/2019, 2:30 PM

## 2019-09-25 NOTE — ED Triage Notes (Signed)
C/o L sided abd pain and vomiting since last night.  Pt states she was admitted earlier in the week for a biliary obstruction and had surgery.

## 2019-09-26 ENCOUNTER — Observation Stay (HOSPITAL_COMMUNITY): Payer: Medicare PPO

## 2019-09-26 DIAGNOSIS — Z801 Family history of malignant neoplasm of trachea, bronchus and lung: Secondary | ICD-10-CM | POA: Diagnosis not present

## 2019-09-26 DIAGNOSIS — N179 Acute kidney failure, unspecified: Secondary | ICD-10-CM | POA: Diagnosis present

## 2019-09-26 DIAGNOSIS — N390 Urinary tract infection, site not specified: Secondary | ICD-10-CM | POA: Diagnosis not present

## 2019-09-26 DIAGNOSIS — K219 Gastro-esophageal reflux disease without esophagitis: Secondary | ICD-10-CM | POA: Diagnosis present

## 2019-09-26 DIAGNOSIS — E860 Dehydration: Secondary | ICD-10-CM | POA: Diagnosis present

## 2019-09-26 DIAGNOSIS — R17 Unspecified jaundice: Secondary | ICD-10-CM

## 2019-09-26 DIAGNOSIS — Z833 Family history of diabetes mellitus: Secondary | ICD-10-CM | POA: Diagnosis not present

## 2019-09-26 DIAGNOSIS — E785 Hyperlipidemia, unspecified: Secondary | ICD-10-CM | POA: Diagnosis present

## 2019-09-26 DIAGNOSIS — R112 Nausea with vomiting, unspecified: Secondary | ICD-10-CM | POA: Diagnosis not present

## 2019-09-26 DIAGNOSIS — R319 Hematuria, unspecified: Secondary | ICD-10-CM | POA: Diagnosis present

## 2019-09-26 DIAGNOSIS — Z885 Allergy status to narcotic agent status: Secondary | ICD-10-CM | POA: Diagnosis not present

## 2019-09-26 DIAGNOSIS — K920 Hematemesis: Secondary | ICD-10-CM | POA: Diagnosis present

## 2019-09-26 DIAGNOSIS — N1330 Unspecified hydronephrosis: Secondary | ICD-10-CM | POA: Diagnosis not present

## 2019-09-26 DIAGNOSIS — Z20828 Contact with and (suspected) exposure to other viral communicable diseases: Secondary | ICD-10-CM | POA: Diagnosis present

## 2019-09-26 DIAGNOSIS — N809 Endometriosis, unspecified: Secondary | ICD-10-CM | POA: Diagnosis present

## 2019-09-26 DIAGNOSIS — Z91048 Other nonmedicinal substance allergy status: Secondary | ICD-10-CM | POA: Diagnosis not present

## 2019-09-26 DIAGNOSIS — E876 Hypokalemia: Secondary | ICD-10-CM | POA: Diagnosis present

## 2019-09-26 DIAGNOSIS — Z91040 Latex allergy status: Secondary | ICD-10-CM | POA: Diagnosis not present

## 2019-09-26 DIAGNOSIS — Z886 Allergy status to analgesic agent status: Secondary | ICD-10-CM | POA: Diagnosis not present

## 2019-09-26 DIAGNOSIS — R945 Abnormal results of liver function studies: Secondary | ICD-10-CM | POA: Diagnosis not present

## 2019-09-26 LAB — SARS CORONAVIRUS 2 (TAT 6-24 HRS): SARS Coronavirus 2: NEGATIVE

## 2019-09-26 LAB — CBC
HCT: 32.2 % — ABNORMAL LOW (ref 36.0–46.0)
Hemoglobin: 11 g/dL — ABNORMAL LOW (ref 12.0–15.0)
MCH: 31 pg (ref 26.0–34.0)
MCHC: 34.2 g/dL (ref 30.0–36.0)
MCV: 90.7 fL (ref 80.0–100.0)
Platelets: 227 10*3/uL (ref 150–400)
RBC: 3.55 MIL/uL — ABNORMAL LOW (ref 3.87–5.11)
RDW: 12.6 % (ref 11.5–15.5)
WBC: 10.1 10*3/uL (ref 4.0–10.5)
nRBC: 0 % (ref 0.0–0.2)

## 2019-09-26 LAB — COMPREHENSIVE METABOLIC PANEL
ALT: 110 U/L — ABNORMAL HIGH (ref 0–44)
AST: 55 U/L — ABNORMAL HIGH (ref 15–41)
Albumin: 2.9 g/dL — ABNORMAL LOW (ref 3.5–5.0)
Alkaline Phosphatase: 146 U/L — ABNORMAL HIGH (ref 38–126)
Anion gap: 8 (ref 5–15)
BUN: 22 mg/dL (ref 8–23)
CO2: 24 mmol/L (ref 22–32)
Calcium: 8.8 mg/dL — ABNORMAL LOW (ref 8.9–10.3)
Chloride: 110 mmol/L (ref 98–111)
Creatinine, Ser: 1.37 mg/dL — ABNORMAL HIGH (ref 0.44–1.00)
GFR calc Af Amer: 46 mL/min — ABNORMAL LOW (ref >60)
GFR calc non Af Amer: 40 mL/min — ABNORMAL LOW (ref >60)
Glucose, Bld: 119 mg/dL — ABNORMAL HIGH (ref 70–99)
Potassium: 3.2 mmol/L — ABNORMAL LOW (ref 3.5–5.1)
Sodium: 142 mmol/L (ref 135–145)
Total Bilirubin: 2.9 mg/dL — ABNORMAL HIGH (ref 0.3–1.2)
Total Protein: 5.8 g/dL — ABNORMAL LOW (ref 6.5–8.1)

## 2019-09-26 LAB — PROTIME-INR
INR: 1 (ref 0.8–1.2)
Prothrombin Time: 13.3 seconds (ref 11.4–15.2)

## 2019-09-26 LAB — HEPATITIS PANEL, ACUTE
HCV Ab: NONREACTIVE
Hep A IgM: NONREACTIVE
Hep B C IgM: NONREACTIVE
Hepatitis B Surface Ag: NONREACTIVE

## 2019-09-26 MED ORDER — SODIUM CHLORIDE 0.9 % IV SOLN
1.0000 g | INTRAVENOUS | Status: DC
  Administered 2019-09-26: 04:00:00 1 g via INTRAVENOUS
  Filled 2019-09-26: qty 1

## 2019-09-26 MED ORDER — FENTANYL CITRATE (PF) 100 MCG/2ML IJ SOLN
25.0000 ug | INTRAMUSCULAR | Status: DC | PRN
  Filled 2019-09-26: qty 2

## 2019-09-26 MED ORDER — ALUM & MAG HYDROXIDE-SIMETH 200-200-20 MG/5ML PO SUSP
30.0000 mL | ORAL | Status: DC | PRN
  Administered 2019-09-26: 30 mL via ORAL
  Filled 2019-09-26: qty 30

## 2019-09-26 MED ORDER — POTASSIUM CHLORIDE 10 MEQ/100ML IV SOLN
10.0000 meq | INTRAVENOUS | Status: AC
  Administered 2019-09-26: 10 meq via INTRAVENOUS
  Filled 2019-09-26 (×4): qty 100

## 2019-09-26 MED ORDER — PANTOPRAZOLE SODIUM 40 MG IV SOLR
40.0000 mg | INTRAVENOUS | Status: DC
  Administered 2019-09-26 – 2019-09-28 (×3): 40 mg via INTRAVENOUS
  Filled 2019-09-26 (×3): qty 40

## 2019-09-26 MED ORDER — ZOLPIDEM TARTRATE 5 MG PO TABS
5.0000 mg | ORAL_TABLET | Freq: Once | ORAL | Status: AC
  Administered 2019-09-27: 5 mg via ORAL
  Filled 2019-09-26: qty 1

## 2019-09-26 MED ORDER — TAMSULOSIN HCL 0.4 MG PO CAPS
0.4000 mg | ORAL_CAPSULE | Freq: Every day | ORAL | Status: DC
  Administered 2019-09-26 – 2019-09-27 (×2): 0.4 mg via ORAL
  Filled 2019-09-26 (×2): qty 1

## 2019-09-26 MED ORDER — POTASSIUM CHLORIDE CRYS ER 20 MEQ PO TBCR
40.0000 meq | EXTENDED_RELEASE_TABLET | Freq: Once | ORAL | Status: AC
  Administered 2019-09-26: 40 meq via ORAL
  Filled 2019-09-26: qty 2

## 2019-09-26 MED ORDER — POTASSIUM CHLORIDE IN NACL 20-0.9 MEQ/L-% IV SOLN
INTRAVENOUS | Status: AC
  Administered 2019-09-26 (×2): via INTRAVENOUS
  Filled 2019-09-26 (×3): qty 1000

## 2019-09-26 NOTE — Progress Notes (Signed)
Pt reports new onset burning on left back  No visible signs of injury  Pt also reports feelings of nausea  PRN medications given  Secure message sent to MD to inform

## 2019-09-26 NOTE — Consult Note (Signed)
Referring Provider: Dr. Maudie Mercury Primary Care Physician:  Seward Emilly, MD Primary Gastroenterologist:  Dr. Watt Climes  Reason for Consultation:  Nausea and vomiting  HPI: [name] Young is a 68 y.o. female recently admitted for gallstone pancreatitis and s/p ERCP 09/23/19 for choledocholithiasis removal and sphincterotomy. Procedure was uneventful and she was discharged home the following day (09/24/19) and did ok until late that night when she had profuse vomiting after eating chicken the night before for dinner. Had coffee-grounds emesis but cannot quantify how much or how many times that it happened. Denies abdominal pain. Complaining of heartburn. No vomiting since admit. Denies any hematochezia or melena. She is slow to respond to questions and does not know the last time she had a BM. She is s/p lap chole in May 2020. Complaining of lower back pain but otherwise feels ok. Lipase 50, TB 3.7, ALP 198, AST 73, ALT 139.  Past Medical History:  Diagnosis Date  . Allergy   . Anemia   . Asthma   . Bronchitis   . Endometriosis   . Hyperlipidemia     Past Surgical History:  Procedure Laterality Date  . CESAREAN SECTION    . CHOLECYSTECTOMY N/A 02/25/2019   Procedure: LAPAROSCOPIC CHOLECYSTECTOMY;  Surgeon: Clovis Riley, MD;  Location: [facility];  Service: General;  Laterality: N/A;  . ERCP N/A 09/23/2019   Procedure: ENDOSCOPIC RETROGRADE CHOLANGIOPANCREATOGRAPHY (ERCP);  Surgeon: Clarene Essex, MD;  Location: [facility];  Service: Endoscopy;  Laterality: N/A;  . laproscopic resection ovrian cyst    . REMOVAL OF STONES  09/23/2019   Procedure: REMOVAL OF STONES;  Surgeon: Clarene Essex, MD;  Location: [facility];  Service: Endoscopy;;  . Joan Mayans  09/23/2019   Procedure: Joan Mayans;  Surgeon: Clarene Essex, MD;  Location: [facility];  Service: Endoscopy;;  . TUBAL LIGATION      Prior to Admission medications   Medication Sig Start Date End Date Taking? Authorizing Provider   acetaminophen (TYLENOL) 500 MG tablet Take 2 tablets (1,000 mg total) by mouth every 8 (eight) hours as needed for mild pain. 02/26/19  Yes Rayburn, Claiborne Billings A, PA-C  bismuth subsalicylate (PEPTO BISMOL) 262 MG/15ML suspension Take 30 mLs by mouth every 6 (six) hours as needed for indigestion.   Yes [provider]  celecoxib (CELEBREX) 200 MG capsule Take 200 mg by mouth 2 (two) times daily. 07/20/19  Yes [provider]  HYDROcodone-acetaminophen (NORCO/VICODIN) 5-325 MG tablet Take 1 tablet by mouth every 4 (four) hours as needed. 09/04/19  Yes Ward, Kristen N, DO  ondansetron (ZOFRAN ODT) 4 MG disintegrating tablet Take 1 tablet (4 mg total) by mouth every 6 (six) hours as needed. Patient taking differently: Take 4 mg by mouth every 6 (six) hours as needed for nausea.  09/04/19  Yes Ward, Kristen N, DO  pantoprazole (PROTONIX) 40 MG tablet Take 1 tablet (40 mg total) by mouth daily. 09/04/19  Yes Ward, Cyril Mourning N, DO  sucralfate (CARAFATE) 1 g tablet Take 1 g by mouth 4 (four) times daily. 09/04/19  Yes [provider]    Scheduled Meds: . pantoprazole (PROTONIX) IV  40 mg Intravenous Q24H  . sodium chloride flush  3 mL Intravenous Once   Continuous Infusions: . 0.9 % NaCl with KCl 20 mEq / L 75 mL/hr at 09/26/19 1421   PRN Meds:.acetaminophen **OR** acetaminophen, ondansetron (ZOFRAN) IV  Allergies as of 09/25/2019 - Review Complete 09/25/2019  Allergen Reaction Noted  . Codeine Itching 02/09/2012  . Latex Dermatitis and Other (  See Comments) 12/06/2016  . Mobic [meloxicam] Other (See Comments) 08/17/2013  . Tape Dermatitis and Other (See Comments) 09/04/2019    Family History  Problem Relation Age of Onset  . Arthritis/Rheumatoid Mother   . Osteoarthritis Father   . Lung cancer Father   . Diabetes Paternal Grandmother   . Diabetes Paternal Grandfather     Social History   Socioeconomic History  . Marital status: Married    Spouse name: Not on file   . Number of children: Not on file  . Years of education: Not on file  . Highest education level: Not on file  Occupational History  . Not on file  Tobacco Use  . Smoking status: Never Smoker  . Smokeless tobacco: Never Used  Substance and Sexual Activity  . Alcohol use: No    Alcohol/week: 0.0 standard drinks  . Drug use: No  . Sexual activity: Not on file  Other Topics Concern  . Not on file  Social History Narrative   Married 1977. 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    Social Determinants of Health   Financial Resource Strain:   . Difficulty of Paying Living Expenses: Not on file  Food Insecurity:   . Worried About Programme researcher, broadcasting/film/videounning Out of Food in the Last Year: Not on file  . Ran Out of Food in the Last Year: Not on file  Transportation Needs:   . Lack of Transportation (Medical): Not on file  . Lack of Transportation (Non-Medical): Not on file  Physical Activity:   . Days of Exercise per Week: Not on file  . Minutes of Exercise per Session: Not on file  Stress:   . Feeling of Stress : Not on file  Social Connections:   . Frequency of Communication with Friends and Family: Not on file  . Frequency of Social Gatherings with Friends and Family: Not on file  . Attends Religious Services: Not on file  . Active Member of Clubs or Organizations: Not on file  . Attends BankerClub or Organization Meetings: Not on file  . Marital Status: Not on file  Intimate Partner Violence:   . Fear of Current or Ex-Partner: Not on file  . Emotionally Abused: Not on file  . Physically Abused: Not on file  . Sexually Abused: Not on file    Review of Systems: All negative except as stated above in HPI.  Physical Exam: Vital signs: Vitals:   09/26/19 0806 09/26/19 1317  BP: 136/66 (!) 154/65  Pulse: 60 64  Resp:    Temp:  98.5 F (36.9 C)  SpO2:  95%   Last BM Date: (pt states prior to admission) General:   Lethargic, elderly, well-nourished, pleasant and cooperative in NAD Head: normocephalic, atraumatic Eyes: +icteric sclera ENT: oropharynx clear Neck: supple, nontender Lungs:  Clear throughout to auscultation.   No wheezes, crackles, or rhonchi. No acute distress. Heart:  Regular rate and rhythm; no murmurs, clicks, rubs,  or gallops. Abdomen: soft, nontender, nondistended, +BS, large bruise in RLQ  Rectal:  Deferred Ext: no edema  GI:  Lab Results: Recent Labs    09/24/19 0542 09/25/19 1937 09/26/19 0440  WBC 4.1 13.7* 10.1  HGB 12.3 13.4 11.0*  HCT 36.0 39.3 32.2*  PLT 227 296 227   BMET Recent Labs    09/24/19 0542 09/25/19 1937 09/26/19 0440  NA 140 138 142  K 3.4* 3.2* 3.2*  CL 106 105 110  CO2 23 22 24   GLUCOSE 94 130* 119*  BUN 11 26* 22  CREATININE 1.10* 1.21* 1.37*  CALCIUM 9.4 9.7 8.8*   LFT Recent Labs    09/26/19 0440  PROT 5.8*  ALBUMIN 2.9*  AST 55*  ALT 110*  ALKPHOS 146*  BILITOT 2.9*   PT/INR Recent Labs    09/26/19 0440  LABPROT 13.3  INR 1.0     Studies/Results: CT ABDOMEN PELVIS WO CONTRAST  Result Date: 09/26/2019 CLINICAL DATA:  Abdominal pain EXAM: CT ABDOMEN AND PELVIS WITHOUT CONTRAST TECHNIQUE: Multidetector CT imaging of the abdomen and pelvis was performed following the standard protocol without IV contrast. COMPARISON:  09/20/2019 FINDINGS: Lower chest: There is atelectasis versus scarring in the lingula.The heart size is normal. Hepatobiliary: The liver is normal. Status post cholecystectomy.The previously demonstrated biliary ductal dilatation has significantly improved since the prior study. Pancreas: Normal contours without ductal dilatation. No peripancreatic fluid collection. Spleen: A few splenic hypoattenuating lesions are identified, not well characterized on this exam. Adrenals/Urinary Tract: --Adrenal glands: No adrenal hemorrhage. --Right kidney/ureter: No hydronephrosis or perinephric hematoma. --Left  kidney/ureter: There is moderate left-sided hydronephrosis without evidence for a radiopaque obstructing kidney stones. This is new since prior study. Of note, no radiopaque stone was visualized on the recent prior CT of the abdomen. --Urinary bladder: Unremarkable. Stomach/Bowel: --Stomach/Duodenum: No hiatal hernia or other gastric abnormality. Normal duodenal course and caliber. --Small bowel: No dilatation or inflammation. --Colon: Rectosigmoid diverticulosis without acute inflammation. --Appendix: Normal. Vascular/Lymphatic: Atherosclerotic calcification is present within the non-aneurysmal abdominal aorta, without hemodynamically significant stenosis. --No retroperitoneal lymphadenopathy. --No mesenteric lymphadenopathy. --No pelvic or inguinal lymphadenopathy. Reproductive: Unremarkable Other: No ascites or free air. The abdominal wall is normal. Musculoskeletal. No acute displaced fractures. IMPRESSION: 1. There is moderate left-sided hydronephrosis without evidence for a radiopaque obstructing kidney stone. Of note, no radiopaque stone was visualized on the recent CT dated September 20, 2019. 2. Status post cholecystectomy. Resolution of previously demonstrated biliary ductal dilatation. Aortic Atherosclerosis (ICD10-I70.0). Electronically Signed   By: September 22, 2019 M.D.   On: 09/26/2019 02:02   DG CHEST PORT 1 VIEW  Result Date: 09/26/2019 CLINICAL DATA:  Nausea and vomiting EXAM: PORTABLE CHEST 1 VIEW COMPARISON:  09/04/2019 FINDINGS: Cardiac shadow is within normal limits. The lungs are well aerated bilaterally without focal infiltrate or sizable effusion. No acute bony abnormality is noted. IMPRESSION: No active disease. Electronically Signed   By: 09/06/2019 M.D.   On: 09/26/2019 02:03    Impression/Plan: 68 yo with profuse N/V one day after ERCP with CBD stone removal and sphincterotomy. No abdominal pain and lipase normal so doubt post-ERCP pancreatitis. Coffee grounds emesis may be due  to a 71 tear or esophagitis and doubt post-sphincterotomy bleed without melena and Hgb 11 with hemodynamic stability. Continue supportive care. Clear liquid diet ok today and hold off on advancing until tomorrow. Reports everything tastes like "salt." [facility] will f/u tomorrow.   LOS: 0 days   [employer]  09/26/2019, 2:46 PM  Questions please call [phone]

## 2019-09-26 NOTE — Plan of Care (Signed)
  Problem: Elimination: Goal: Will not experience complications related to urinary retention Outcome: Completed/Met   Problem: Pain Managment: Goal: General experience of comfort will improve Outcome: Completed/Met

## 2019-09-26 NOTE — Progress Notes (Addendum)
PROGRESS NOTE    [name] Young  WUJ:[id number] DOB: November 16, 1950 DOA: 09/25/2019 PCP: Renford DillsPolite, Ronald, MD  Brief Narrative: 68 year old female with history of asthma, bronchitis, dyslipidemia, endometriosis underwent laparoscopic cholecystectomy in May/2020, was admitted from 12/8-12/11 with acute gallstone pancreatitis, she had an MRCP which showed CBD obstruction, underwent ERCP and sphincterotomy without stent placement 12/10 -She clinically improved and was discharged home 12/11 on a soft diet. -Presented back to the emergency room 12/12 evening with nausea vomiting and hematemesis  Assessment & Plan:      Nausea & vomiting -Etiology is unclear, she did have some hematemesis yesterday, gastritis/GERD and hydrocodone use Friday night could have contributed in the background of recent gallstone pancreatitis, LFTs continue to trend down -CT abdomen pelvis unremarkable, left hydronephrosis noted this is of questionable significance -IV PPI today, clear liquids -Continue IV fluids with potassium -Gastroenterology consulted last night  Recent gallstone pancreatitis -Status post sphincterotomy and stone extraction 12/10 -CT unremarkable, LFTs improving -Lipase normal on admission  Left hydronephrosis -New finding, not seen on CT last week -Has mild hematuria, no symptoms of UTI or kidney stones -unclear significance, d/w Urology Dr.Borden, he recommended FU with Urology as outpt unless she develops acute symptoms here  Mild AKI -Due to dehydration and vomiting, continue IV fluids today -I do not think mild left hydronephrosis is contributing, will monitor with hydration  GERD -Continue PPI  Hypokalemia -Due to vomiting, replace  DVT prophylaxis: SCDs due to hematemesis last night Code Status: Full code Family Communication: No family at bedside Disposition Plan: Home in 1 to 2 days  Consultants:   [facility]   Procedures:   Antimicrobials:    Subjective: -No  vomiting today, feels weak, oral intake is poor, denies any abdominal pain, denies any urinary symptoms  Objective: Vitals:   09/26/19 0238 09/26/19 0652 09/26/19 0806 09/26/19 0928  BP: 131/76  136/66   Pulse: 63  60   Resp: 18     Temp: 98 F (36.7 C)     TempSrc: Oral     SpO2: 96%     Weight:  69 kg    Height:    5' (1.524 m)    Intake/Output Summary (Last 24 hours) at 09/26/2019 1050 Last data filed at 09/26/2019 1026 Gross per 24 hour  Intake 2291.2 ml  Output --  Net 2291.2 ml   Filed Weights   09/26/19 0652  Weight: 69 kg    Examination:  General exam: AAO x3, extremely flat affect Respiratory system: Clear to auscultation. Respiratory effort normal. Cardiovascular system: S1 & S2 heard, RRR.  Gastrointestinal system: Soft nontender, bowel sounds present, no flank tenderness Central nervous system: Alert and oriented. No focal neurological deficits. Extremities: No edema Skin: No rashes Psychiatry: Flat affect   Data Reviewed:   CBC: Recent Labs  Lab 09/22/19 0949 09/23/19 0332 09/24/19 0542 09/25/19 1937 09/26/19 0440  WBC 4.2 4.0 4.1 13.7* 10.1  NEUTROABS 2.6  --  2.3  --   --   HGB 13.7 12.1 12.3 13.4 11.0*  HCT 40.5 35.7* 36.0 39.3 32.2*  MCV 91.8 89.9 90.0 89.9 90.7  PLT 244 236 227 296 227   Basic Metabolic Panel: Recent Labs  Lab 09/22/19 0949 09/23/19 0332 09/24/19 0542 09/25/19 1937 09/26/19 0440  NA 142 143 140 138 142  K 3.5 3.0* 3.4* 3.2* 3.2*  CL 106 108 106 105 110  CO2 24 24 23 22 24   GLUCOSE 103* 89 94 130* 119*  BUN 13 11  11 26* 22  CREATININE 0.78 0.99 1.10* 1.21* 1.37*  CALCIUM 9.5 9.2 9.4 9.7 8.8*   GFR: Estimated Creatinine Clearance: 35 mL/min (A) (by C-G formula based on SCr of 1.37 mg/dL (H)). Liver Function Tests: Recent Labs  Lab 09/22/19 0949 09/23/19 0332 09/24/19 0542 09/25/19 1937 09/26/19 0440  AST 107* 71* 57* 73* 55*  ALT 193* 148* 130* 139* 110*  ALKPHOS 291* 253* 230* 198* 146*  BILITOT  7.3* 5.0* 4.2* 3.7* 2.9*  PROT 6.4* 5.9* 6.3* 7.5 5.8*  ALBUMIN 3.4* 3.0* 3.1* 3.8 2.9*   Recent Labs  Lab 09/20/19 1808 09/21/19 1206 09/22/19 0949 09/23/19 0332 09/25/19 1937  LIPASE 2,976* 3,204* 384* 139* 50   Recent Labs  Lab 09/25/19 2233  AMMONIA 18   Coagulation Profile: Recent Labs  Lab 09/26/19 0440  INR 1.0   Cardiac Enzymes: No results for input(s): CKTOTAL, CKMB, CKMBINDEX, TROPONINI in the last 168 hours. BNP (last 3 results) No results for input(s): PROBNP in the last 8760 hours. HbA1C: No results for input(s): HGBA1C in the last 72 hours. CBG: Recent Labs  Lab 09/23/19 0627  GLUCAP 95   Lipid Profile: No results for input(s): CHOL, HDL, LDLCALC, TRIG, CHOLHDL, LDLDIRECT in the last 72 hours. Thyroid Function Tests: No results for input(s): TSH, T4TOTAL, FREET4, T3FREE, THYROIDAB in the last 72 hours. Anemia Panel: No results for input(s): VITAMINB12, FOLATE, FERRITIN, TIBC, IRON, RETICCTPCT in the last 72 hours. Urine analysis:    Component Value Date/Time   COLORURINE AMBER (A) 09/25/2019 1912   APPEARANCEUR CLEAR 09/25/2019 1912   LABSPEC 1.024 09/25/2019 1912   PHURINE 5.0 09/25/2019 1912   GLUCOSEU NEGATIVE 09/25/2019 1912   HGBUR LARGE (A) 09/25/2019 [place] NEGATIVE 09/25/2019 1912   KETONESUR 5 (A) 09/25/2019 1912   PROTEINUR 30 (A) 09/25/2019 1912   UROBILINOGEN 1.0 07/29/2015 0209   NITRITE NEGATIVE 09/25/2019 1912   LEUKOCYTESUR NEGATIVE 09/25/2019 1912   Sepsis Labs: @LABRCNTIP (procalcitonin:4,lacticidven:4)  ) Recent Results (from the past 240 hour(s))  Respiratory Panel by RT PCR (Flu A&B, Covid) - Nasopharyngeal Swab     Status: None   Collection Time: 09/21/19  5:32 PM   Specimen: Nasopharyngeal Swab  Result Value Ref Range Status   SARS Coronavirus 2 by RT PCR NEGATIVE NEGATIVE Final    Comment: (NOTE) SARS-CoV-2 target nucleic acids are NOT DETECTED. The SARS-CoV-2 RNA is generally detectable in upper  respiratoy specimens during the acute phase of infection. The lowest concentration of SARS-CoV-2 viral copies this assay can detect is 131 copies/mL. A negative result does not preclude SARS-Cov-2 infection and should not be used as the sole basis for treatment or other patient management decisions. A negative result may occur with  improper specimen collection/handling, submission of specimen other than nasopharyngeal swab, presence of viral mutation(s) within the areas targeted by this assay, and inadequate number of viral copies (<131 copies/mL). A negative result must be combined with clinical observations, patient history, and epidemiological information. The expected result is Negative. Fact Sheet for Patients:  PinkCheek.be Fact Sheet for Healthcare Providers:  GravelBags.it This test is not yet ap proved or cleared by the [place] FDA and  has been authorized for detection and/or diagnosis of SARS-CoV-2 by FDA under an Emergency Use Authorization (EUA). This EUA will remain  in effect (meaning this test can be used) for the duration of the COVID-19 declaration under Section 564(b)(1) of the Act, 21 U.S.C. section 360bbb-3(b)(1), unless the authorization is terminated or revoked sooner.  Influenza A by PCR NEGATIVE NEGATIVE Final   Influenza B by PCR NEGATIVE NEGATIVE Final    Comment: (NOTE) The Xpert Xpress SARS-CoV-2/FLU/RSV assay is intended as an aid in  the diagnosis of influenza from Nasopharyngeal swab specimens and  should not be used as a sole basis for treatment. Nasal washings and  aspirates are unacceptable for Xpert Xpress SARS-CoV-2/FLU/RSV  testing. Fact Sheet for Patients: https://www.moore.com/ Fact Sheet for Healthcare Providers: https://www.Young.biz/ This test is not yet approved or cleared by the [place] FDA and  has been authorized for  detection and/or diagnosis of SARS-CoV-2 by  FDA under an Emergency Use Authorization (EUA). This EUA will remain  in effect (meaning this test can be used) for the duration of the  Covid-19 declaration under Section 564(b)(1) of the Act, 21  U.S.C. section 360bbb-3(b)(1), unless the authorization is  terminated or revoked. Performed at [facility], [address], [place], [place] [address]   Surgical pcr screen     Status: Abnormal   Collection Time: 09/22/19  7:50 PM   Specimen: Nasal Mucosa; Nasal Swab  Result Value Ref Range Status   MRSA, PCR POSITIVE (A) NEGATIVE Final   Staphylococcus aureus POSITIVE (A) NEGATIVE Final    Comment: RESULT CALLED TO, READ BACK BY AND VERIFIED WITH: E . OFORI RN 09/22/19 2139 JDW (NOTE) The Xpert SA Assay (FDA approved for NASAL specimens in patients 65 years of age and older), is one component of a comprehensive surveillance program. It is not intended to diagnose infection nor to guide or monitor treatment. Performed at [facility], [address] [place], [place] [address]   SARS CORONAVIRUS 2 (TAT 6-24 HRS) Nasopharyngeal Nasopharyngeal Swab     Status: None   Collection Time: 09/26/19 12:06 AM   Specimen: Nasopharyngeal Swab  Result Value Ref Range Status   SARS Coronavirus 2 NEGATIVE NEGATIVE Final    Comment: (NOTE) SARS-CoV-2 target nucleic acids are NOT DETECTED. The SARS-CoV-2 RNA is generally detectable in upper and lower respiratory specimens during the acute phase of infection. Negative results do not preclude SARS-CoV-2 infection, do not rule out co-infections with other pathogens, and should not be used as the sole basis for treatment or other patient management decisions. Negative results must be combined with clinical observations, patient history, and epidemiological information. The expected result is Negative. Fact Sheet for Patients: HairSlick.no Fact Sheet for Healthcare  Providers: quierodirigir.com This test is not yet approved or cleared by the [place] FDA and  has been authorized for detection and/or diagnosis of SARS-CoV-2 by FDA under an Emergency Use Authorization (EUA). This EUA will remain  in effect (meaning this test can be used) for the duration of the COVID-19 declaration under Section 56 4(b)(1) of the Act, 21 U.S.C. section 360bbb-3(b)(1), unless the authorization is terminated or revoked sooner. Performed at [facility], [address] [place], [place] [address]          Radiology Studies: CT ABDOMEN PELVIS WO CONTRAST  Result Date: 09/26/2019 CLINICAL DATA:  Abdominal pain EXAM: CT ABDOMEN AND PELVIS WITHOUT CONTRAST TECHNIQUE: Multidetector CT imaging of the abdomen and pelvis was performed following the standard protocol without IV contrast. COMPARISON:  09/20/2019 FINDINGS: Lower chest: There is atelectasis versus scarring in the lingula.The heart size is normal. Hepatobiliary: The liver is normal. Status post cholecystectomy.The previously demonstrated biliary ductal dilatation has significantly improved since the prior study. Pancreas: Normal contours without ductal dilatation. No peripancreatic fluid collection.  Spleen: A few splenic hypoattenuating lesions are identified, not well characterized on this exam. Adrenals/Urinary Tract: --Adrenal glands: No adrenal hemorrhage. --Right kidney/ureter: No hydronephrosis or perinephric hematoma. --Left kidney/ureter: There is moderate left-sided hydronephrosis without evidence for a radiopaque obstructing kidney stones. This is new since prior study. Of note, no radiopaque stone was visualized on the recent prior CT of the abdomen. --Urinary bladder: Unremarkable. Stomach/Bowel: --Stomach/Duodenum: No hiatal hernia or other gastric abnormality. Normal duodenal course and caliber. --Small bowel: No dilatation or inflammation. --Colon: Rectosigmoid  diverticulosis without acute inflammation. --Appendix: Normal. Vascular/Lymphatic: Atherosclerotic calcification is present within the non-aneurysmal abdominal aorta, without hemodynamically significant stenosis. --No retroperitoneal lymphadenopathy. --No mesenteric lymphadenopathy. --No pelvic or inguinal lymphadenopathy. Reproductive: Unremarkable Other: No ascites or free air. The abdominal wall is normal. Musculoskeletal. No acute displaced fractures. IMPRESSION: 1. There is moderate left-sided hydronephrosis without evidence for a radiopaque obstructing kidney stone. Of note, no radiopaque stone was visualized on the recent CT dated September 20, 2019. 2. Status post cholecystectomy. Resolution of previously demonstrated biliary ductal dilatation. Aortic Atherosclerosis (ICD10-I70.0). Electronically Signed   By: Katherine Mantle M.D.   On: 09/26/2019 02:02   DG CHEST PORT 1 VIEW  Result Date: 09/26/2019 CLINICAL DATA:  Nausea and vomiting EXAM: PORTABLE CHEST 1 VIEW COMPARISON:  09/04/2019 FINDINGS: Cardiac shadow is within normal limits. The lungs are well aerated bilaterally without focal infiltrate or sizable effusion. No acute bony abnormality is noted. IMPRESSION: No active disease. Electronically Signed   By: Alcide Clever M.D.   On: 09/26/2019 02:03        Scheduled Meds:  pantoprazole (PROTONIX) IV  40 mg Intravenous Q24H   sodium chloride flush  3 mL Intravenous Once   Continuous Infusions:  potassium chloride 10 mEq (09/26/19 0811)     LOS: 0 days    Time spent:  Zannie Cove, MD [facility]  09/26/2019, 10:50 AM

## 2019-09-27 ENCOUNTER — Inpatient Hospital Stay (HOSPITAL_COMMUNITY): Payer: Medicare PPO

## 2019-09-27 LAB — COMPREHENSIVE METABOLIC PANEL
ALT: 79 U/L — ABNORMAL HIGH (ref 0–44)
AST: 30 U/L (ref 15–41)
Albumin: 2.6 g/dL — ABNORMAL LOW (ref 3.5–5.0)
Alkaline Phosphatase: 122 U/L (ref 38–126)
Anion gap: 9 (ref 5–15)
BUN: 15 mg/dL (ref 8–23)
CO2: 22 mmol/L (ref 22–32)
Calcium: 8.9 mg/dL (ref 8.9–10.3)
Chloride: 113 mmol/L — ABNORMAL HIGH (ref 98–111)
Creatinine, Ser: 0.79 mg/dL (ref 0.44–1.00)
GFR calc Af Amer: >60 mL/min (ref >60)
GFR calc non Af Amer: >60 mL/min (ref >60)
Glucose, Bld: 85 mg/dL (ref 70–99)
Potassium: 3.6 mmol/L (ref 3.5–5.1)
Sodium: 144 mmol/L (ref 135–145)
Total Bilirubin: 2.9 mg/dL — ABNORMAL HIGH (ref 0.3–1.2)
Total Protein: 5.4 g/dL — ABNORMAL LOW (ref 6.5–8.1)

## 2019-09-27 LAB — CBC
HCT: 28.2 % — ABNORMAL LOW (ref 36.0–46.0)
Hemoglobin: 9.7 g/dL — ABNORMAL LOW (ref 12.0–15.0)
MCH: 31.3 pg (ref 26.0–34.0)
MCHC: 34.4 g/dL (ref 30.0–36.0)
MCV: 91 fL (ref 80.0–100.0)
Platelets: 206 10*3/uL (ref 150–400)
RBC: 3.1 MIL/uL — ABNORMAL LOW (ref 3.87–5.11)
RDW: 12.6 % (ref 11.5–15.5)
WBC: 6 10*3/uL (ref 4.0–10.5)
nRBC: 0 % (ref 0.0–0.2)

## 2019-09-27 LAB — MRSA PCR SCREENING: MRSA by PCR: NEGATIVE

## 2019-09-27 MED ORDER — POTASSIUM CHLORIDE IN NACL 20-0.9 MEQ/L-% IV SOLN
INTRAVENOUS | Status: DC
  Administered 2019-09-27 (×2): via INTRAVENOUS
  Filled 2019-09-27 (×2): qty 1000

## 2019-09-27 NOTE — Progress Notes (Signed)
Pt stated she had burning in her upper chest when drinking. Denied pain.  Pt stated" she believes IV fluids are poisoned and need to be tested." IV fluids examined by nurse and integrity confirmed. Pt was reassured but she appear to be very anxious. No visible rash on her body. She denied itching or possible allergic reaction. Pt educated that if it happened when eating, it can possible be GERD. Provider notified, no orders given.

## 2019-09-27 NOTE — Progress Notes (Addendum)
PROGRESS NOTE    [name] Young  ZOX:[id number] DOB: [birth year] DOA: 09/25/2019 PCP: Renford Dills, MD  Brief Narrative: 68 year old female with history of asthma, bronchitis, dyslipidemia, endometriosis underwent laparoscopic cholecystectomy in May/2020, was admitted from 12/8-12/11 with acute gallstone pancreatitis, she had an MRCP which showed CBD obstruction, underwent ERCP and sphincterotomy without stent placement 12/10 -She clinically improved and was discharged home 12/11 on a soft diet. -Presented back to the emergency room 12/12 evening with nausea vomiting and hematemesis  Assessment & Plan:      Nausea & vomiting -Initially felt to be nonspecific from recent gallstone pancreatitis however lipase and LFTs are improving, very poor historian  -CT abdomen pelvis - left hydronephrosis noted, this was not seen on prior CT last week, urinalysis with mild hematuria but not suggestive of infection, pancreas and gallbladder fossa was unremarkable -[facility] following  Recent gallstone pancreatitis -Status post sphincterotomy and stone extraction 12/10 -CT unremarkable, LFTs improving -Lipase normal on admission -now on clears  Left hydronephrosis/hematuria -resolved on [place] 12/14 -New finding on admission, not seen on CT last week -Has mild hematuria, no symptoms of UTI or kidney stones -unclear significance, d/w Urology Dr.Borden and Dr.Wrenn, repeat US with resolution of hydronephrosis, no further workup needed now -FU with Urology  Mild AKI -Due to dehydration and vomiting, continue IV fluids today -resolved  GERD -Continue PPI  Hypokalemia -Due to vomiting, replaced  DVT prophylaxis: SCDs Code Status: Full code Family Communication: No family at bedside Disposition Plan: Home pending improvement in nausea and vomiting  Consultants:   [facility]   Procedures:   Antimicrobials:    Subjective: -Started having severe left flank pain  yesterday, is a very poor historian, requiring IV fentanyl  Objective: Vitals:   09/26/19 1317 09/26/19 1623 09/27/19 0103 09/27/19 0609  BP: (!) 154/65 (!) 180/69 125/64 (!) 113/58  Pulse: 64 61 76 63  Resp:  Temp: 98.5 F (36.9 C) 98.8 F (37.1 C) 98 F (36.7 C) 97.9 F (36.6 C)  TempSrc: Oral Oral Oral Oral  SpO2: 95% 95% 96% 95%  Weight:      Height:        Intake/Output Summary (Last 24 hours) at 09/27/2019 1214 Last data filed at 09/27/2019 0600 Gross per 24 hour  Intake 1636.05 ml  Output --  Net 1636.05 ml   Filed Weights   09/26/19 0652  Weight: 69 kg    Examination:  Gen: Awake alert oriented x3, extremely flat affect HEENT: PERRLA, Neck supple, no JVD Lungs: Diminished breath sounds the bases CVS: RRR,No Gallops,Rubs or new Murmurs Abd: Soft, nondistended, mild left flank tenderness, bowel sounds present  extremities: No edema Skin: no new rashes Psychiatry: Flat affect   Data Reviewed:   CBC: Recent Labs  Lab 09/22/19 0949 09/23/19 0332 09/24/19 0542 09/25/19 1937 09/26/19 0440 09/27/19 0533  WBC 4.2 4.0 4.1 13.7* 10.1 6.0  NEUTROABS 2.6  --  2.3  --   --   --   HGB 13.7 12.1 12.3 13.4 11.0* 9.7*  HCT 40.5 35.7* 36.0 39.3 32.2* 28.2*  MCV 91.8 89.9 90.0 89.9 90.7 91.0  PLT 244 236 227 296 227 206   Basic Metabolic Panel: Recent Labs  Lab 09/23/19 0332 09/24/19 0542 09/25/19 1937 09/26/19 0440 09/27/19 0533  NA 143 140 138 142 144  K 3.0* 3.4* 3.2* 3.2* 3.6  CL 108 106 105 110 113*  CO2 GLUCOSE 89 94 130* 119*  85  BUN 11 11 26* 22 15  CREATININE 0.99 1.10* 1.21* 1.37* 0.79  CALCIUM 9.2 9.4 9.7 8.8* 8.9   GFR: Estimated Creatinine Clearance: 60 mL/min (by C-G formula based on SCr of 0.79 mg/dL). Liver Function Tests: Recent Labs  Lab 09/23/19 0332 09/24/19 0542 09/25/19 1937 09/26/19 0440 09/27/19 0533  AST 71* 57* 73* 55* 30  ALT 148* 130* 139* 110* 79*  ALKPHOS 253* 230* 198* 146* 122   BILITOT 5.0* 4.2* 3.7* 2.9* 2.9*  PROT 5.9* 6.3* 7.5 5.8* 5.4*  ALBUMIN 3.0* 3.1* 3.8 2.9* 2.6*   Recent Labs  Lab 09/20/19 1808 09/21/19 1206 09/22/19 0949 09/23/19 0332 09/25/19 1937  LIPASE 2,976* 3,204* 384* 139* 50   Recent Labs  Lab 09/25/19 2233  AMMONIA 18   Coagulation Profile: Recent Labs  Lab 09/26/19 0440  INR 1.0   Cardiac Enzymes: No results for input(s): CKTOTAL, CKMB, CKMBINDEX, TROPONINI in the last 168 hours. BNP (last 3 results) No results for input(s): PROBNP in the last 8760 hours. HbA1C: No results for input(s): HGBA1C in the last 72 hours. CBG: Recent Labs  Lab 09/23/19 0627  GLUCAP 95   Lipid Profile: No results for input(s): CHOL, HDL, LDLCALC, TRIG, CHOLHDL, LDLDIRECT in the last 72 hours. Thyroid Function Tests: No results for input(s): TSH, T4TOTAL, FREET4, T3FREE, THYROIDAB in the last 72 hours. Anemia Panel: No results for input(s): VITAMINB12, FOLATE, FERRITIN, TIBC, IRON, RETICCTPCT in the last 72 hours. Urine analysis:    Component Value Date/Time   COLORURINE AMBER (A) 09/25/2019 1912   APPEARANCEUR CLEAR 09/25/2019 1912   LABSPEC 1.024 09/25/2019 1912   PHURINE 5.0 09/25/2019 1912   GLUCOSEU NEGATIVE 09/25/2019 1912   HGBUR LARGE (A) 09/25/2019 [place] NEGATIVE 09/25/2019 1912   KETONESUR 5 (A) 09/25/2019 1912   PROTEINUR 30 (A) 09/25/2019 1912   UROBILINOGEN 1.0 07/29/2015 0209   NITRITE NEGATIVE 09/25/2019 1912   LEUKOCYTESUR NEGATIVE 09/25/2019 1912   Sepsis Labs: @LABRCNTIP (procalcitonin:4,lacticidven:4)  ) Recent Results (from the past 240 hour(s))  Respiratory Panel by RT PCR (Flu A&B, Covid) - Nasopharyngeal Swab     Status: None   Collection Time: 09/21/19  5:32 PM   Specimen: Nasopharyngeal Swab  Result Value Ref Range Status   SARS Coronavirus 2 by RT PCR NEGATIVE NEGATIVE Final    Comment: (NOTE) SARS-CoV-2 target nucleic acids are NOT DETECTED. The SARS-CoV-2 RNA is generally detectable  in upper respiratoy specimens during the acute phase of infection. The lowest concentration of SARS-CoV-2 viral copies this assay can detect is 131 copies/mL. A negative result does not preclude SARS-Cov-2 infection and should not be used as the sole basis for treatment or other patient management decisions. A negative result may occur with  improper specimen collection/handling, submission of specimen other than nasopharyngeal swab, presence of viral mutation(s) within the areas targeted by this assay, and inadequate number of viral copies (<131 copies/mL). A negative result must be combined with clinical observations, patient history, and epidemiological information. The expected result is Negative. Fact Sheet for Patients:  PinkCheek.be Fact Sheet for Healthcare Providers:  GravelBags.it This test is not yet ap proved or cleared by the [place] FDA and  has been authorized for detection and/or diagnosis of SARS-CoV-2 by FDA under an Emergency Use Authorization (EUA). This EUA will remain  in effect (meaning this test can be used) for the duration of the COVID-19 declaration under Section 564(b)(1) of the Act, 21 U.S.C. section 360bbb-3(b)(1), unless the authorization is terminated  or revoked sooner.    Influenza A by PCR NEGATIVE NEGATIVE Final   Influenza B by PCR NEGATIVE NEGATIVE Final    Comment: (NOTE) The Xpert Xpress SARS-CoV-2/FLU/RSV assay is intended as an aid in  the diagnosis of influenza from Nasopharyngeal swab specimens and  should not be used as a sole basis for treatment. Nasal washings and  aspirates are unacceptable for Xpert Xpress SARS-CoV-2/FLU/RSV  testing. Fact Sheet for Patients: https://www.moore.com/https://www.fda.gov/media/142436/download Fact Sheet for Healthcare Providers: https://www.young.biz/https://www.fda.gov/media/142435/download This test is not yet approved or cleared by the [place] FDA and  has been authorized for  detection and/or diagnosis of SARS-CoV-2 by  FDA under an Emergency Use Authorization (EUA). This EUA will remain  in effect (meaning this test can be used) for the duration of the  Covid-19 declaration under Section 564(b)(1) of the Act, 21  U.S.C. section 360bbb-3(b)(1), unless the authorization is  terminated or revoked. Performed at [facility], [address] [place], [place] [address]   Surgical pcr screen     Status: Abnormal   Collection Time: 09/22/19  7:50 PM   Specimen: Nasal Mucosa; Nasal Swab  Result Value Ref Range Status   MRSA, PCR POSITIVE (A) NEGATIVE Final   Staphylococcus aureus POSITIVE (A) NEGATIVE Final    Comment: RESULT CALLED TO, READ BACK BY AND VERIFIED WITH: E . OFORI RN 09/22/19 2139 JDW (NOTE) The Xpert SA Assay (FDA approved for NASAL specimens in patients 422 years of age and older), is one component of a comprehensive surveillance program. It is not intended to diagnose infection nor to guide or monitor treatment. Performed at [facility], [address] [place], [place] [address]   SARS CORONAVIRUS 2 (TAT 6-24 HRS) Nasopharyngeal Nasopharyngeal Swab     Status: None   Collection Time: 09/26/19 12:06 AM   Specimen: Nasopharyngeal Swab  Result Value Ref Range Status   SARS Coronavirus 2 NEGATIVE NEGATIVE Final    Comment: (NOTE) SARS-CoV-2 target nucleic acids are NOT DETECTED. The SARS-CoV-2 RNA is generally detectable in upper and lower respiratory specimens during the acute phase of infection. Negative results do not preclude SARS-CoV-2 infection, do not rule out co-infections with other pathogens, and should not be used as the sole basis for treatment or other patient management decisions. Negative results must be combined with clinical observations, patient history, and epidemiological information. The expected result is Negative. Fact Sheet for Patients: HairSlick.nohttps://www.fda.gov/media/138098/download Fact Sheet for Healthcare  Providers: quierodirigir.comhttps://www.fda.gov/media/138095/download This test is not yet approved or cleared by the [place] FDA and  has been authorized for detection and/or diagnosis of SARS-CoV-2 by FDA under an Emergency Use Authorization (EUA). This EUA will remain  in effect (meaning this test can be used) for the duration of the COVID-19 declaration under Section 56 4(b)(1) of the Act, 21 U.S.C. section 360bbb-3(b)(1), unless the authorization is terminated or revoked sooner. Performed at [facility], [address] [place], [place] [address]   MRSA PCR Screening     Status: None   Collection Time: 09/27/19  7:22 AM   Specimen: Nasal Mucosa; Nasopharyngeal  Result Value Ref Range Status   MRSA by PCR NEGATIVE NEGATIVE Final    Comment:        The GeneXpert MRSA Assay (FDA approved for NASAL specimens only), is one component of a comprehensive MRSA colonization surveillance program. It is not intended to diagnose MRSA infection nor to guide or monitor treatment for MRSA infections. Performed at [facility], [address], [place], [place] [address]          Radiology Studies: CT ABDOMEN PELVIS WO CONTRAST  Result Date: 09/26/2019 CLINICAL DATA:  Abdominal pain EXAM: CT ABDOMEN AND PELVIS WITHOUT CONTRAST TECHNIQUE: Multidetector CT imaging of the abdomen and pelvis was performed following the standard protocol without IV contrast. COMPARISON:  09/20/2019 FINDINGS: Lower chest: There is atelectasis versus scarring in the lingula.The heart size is normal. Hepatobiliary: The liver is normal. Status post cholecystectomy.The previously demonstrated biliary ductal dilatation has significantly improved since the prior study. Pancreas: Normal contours without ductal dilatation. No peripancreatic fluid collection. Spleen: A few splenic hypoattenuating lesions are identified, not well characterized on this exam. Adrenals/Urinary Tract: --Adrenal glands: No adrenal hemorrhage.  --Right kidney/ureter: No hydronephrosis or perinephric hematoma. --Left kidney/ureter: There is moderate left-sided hydronephrosis without evidence for a radiopaque obstructing kidney stones. This is new since prior study. Of note, no radiopaque stone was visualized on the recent prior CT of the abdomen. --Urinary bladder: Unremarkable. Stomach/Bowel: --Stomach/Duodenum: No hiatal hernia or other gastric abnormality. Normal duodenal course and caliber. --Small bowel: No dilatation or inflammation. --Colon: Rectosigmoid diverticulosis without acute inflammation. --Appendix: Normal. Vascular/Lymphatic: Atherosclerotic calcification is present within the non-aneurysmal abdominal aorta, without hemodynamically significant stenosis. --No retroperitoneal lymphadenopathy. --No mesenteric lymphadenopathy. --No pelvic or inguinal lymphadenopathy. Reproductive: Unremarkable Other: No ascites or free air. The abdominal wall is normal. Musculoskeletal. No acute displaced fractures. IMPRESSION: 1. There is moderate left-sided hydronephrosis without evidence for a radiopaque obstructing kidney stone. Of note, no radiopaque stone was visualized on the recent CT dated September 20, 2019. 2. Status post cholecystectomy. Resolution of previously demonstrated biliary ductal dilatation. Aortic Atherosclerosis (ICD10-I70.0). Electronically Signed   By: Katherine Mantle M.D.   On: 09/26/2019 02:02   DG CHEST PORT 1 VIEW  Result Date: 09/26/2019 CLINICAL DATA:  Nausea and vomiting EXAM: PORTABLE CHEST 1 VIEW COMPARISON:  09/04/2019 FINDINGS: Cardiac shadow is within normal limits. The lungs are well aerated bilaterally without focal infiltrate or sizable effusion. No acute bony abnormality is noted. IMPRESSION: No active disease. Electronically Signed   By: Alcide Clever M.D.   On: 09/26/2019 02:03        Scheduled Meds: . pantoprazole (PROTONIX) IV  40 mg Intravenous Q24H  . sodium chloride flush  3 mL Intravenous Once  .  tamsulosin  0.4 mg Oral Daily  . zolpidem  5 mg Oral Once   Continuous Infusions:    LOS: 1 day   Time spent:  Zannie Cove, MD [facility]  09/27/2019, 12:14 PM

## 2019-09-27 NOTE — Progress Notes (Signed)
Initial Nutrition Assessment  DOCUMENTATION CODES:   Not applicable  INTERVENTION:   -RD will follow for diet advancement and add supplement as appropriate  NUTRITION DIAGNOSIS:   Inadequate oral intake related to altered GI function as evidenced by NPO status.  GOAL:   Patient will meet greater than or equal to 90% of their needs  MONITOR:   PO intake, Supplement acceptance, Diet advancement, Labs, Weight trends, Skin, I & O's  REASON FOR ASSESSMENT:   Malnutrition Screening Tool    ASSESSMENT:   [name] Young  is a 68 y.o. female,  w hyperlipidemia, asthma, endometriosis, s/p lap chole 02/25/2019 w recent admission 09/21/2019 for acute gallstone pancreatitis s/p ERCP w sphincterotomy  09/23/2019  Presents with c/o abdominal pain.  Pt states that the pain is generalized.  N/v started around 7pm,  ? Coffee ground emesis.   Pt denies fever, chills, cough, cp, palp, sob, diarrhea, brbpr.  Pt admitted with nausea and vomiting.   Reviewed I/O's: +2.5L x 24 hours and +3.9 L since admission  Spoke with pt at bedside, who was very tangential at time of visit. She reports that she has experienced a general decline in health over the past 7 months, every since undergoing gallbladder surgery. Since then she has had a decreased appetite and reports that she has been hospitalized approximately 4 times over the past 2-3 months due to inability to keep foods and liquids down. Pt shares that she was hospitalized this weekend due to appearing jaundiced and unable to keep foods and liquids down- pt was able to tolerate a soft diet prior to discharge, but was eating very little. Pt returned to the hospital after vomiting episode with blood and coffee ground emesis, which she experienced after consuming a [employer] chicken brought to her by a friend.   Pt shares that she is afraid to eat due to fear of being unable to tolerate it. Noted clear liquid tray at bedside was untouched. Per pt,  "I just don't want to chance it". RD attempted to obtain diet recall, however, but often would give unclear history or tangential responses to questions. Noted meal completion 0-10%.   Per pt, UBW is around 120-130#. She estimates she has lost about 30# within the past 7 months. Noted pt has experienced a 10.4% wt loss over the past month, which is significant for time frame. Pt also reports feeling weaker and more forgetful  Medications reviewed and include 0.9% NaCl with KCl 20 meEq/ L infusion @ 75 ml/hr.   Labs reviewed: K: 3.2.   NUTRITION - FOCUSED PHYSICAL EXAM:    Most Recent Value  Orbital Region  Mild depletion  Upper Arm Region  No depletion  Thoracic and Lumbar Region  No depletion  Buccal Region  No depletion  Temple Region  No depletion  Clavicle Bone Region  No depletion  Clavicle and Acromion Bone Region  No depletion  Scapular Bone Region  No depletion  Dorsal Hand  Mild depletion  Patellar Region  No depletion  Anterior Thigh Region  No depletion  Posterior Calf Region  No depletion  Edema (RD Assessment)  Mild  Hair  Reviewed  Eyes  Reviewed  Mouth  Reviewed  Skin  Reviewed  Nails  Reviewed       Diet Order:   Diet Order    None      EDUCATION NEEDS:   No education needs have been identified at this time  Skin:  Skin Assessment: Reviewed  RN Assessment  Last BM:  Unknown  Height:   Ht Readings from Last 1 Encounters:  09/26/19 5' (1.524 m)    Weight:   Wt Readings from Last 1 Encounters:  09/26/19 69 kg    Ideal Body Weight:  45.5 kg  BMI:  Body mass index is 29.71 kg/m.  Estimated Nutritional Needs:   Kcal:  1750-1950  Protein:  90-105 grams  Fluid:  > 1.7 L    Athanasius Kesling A. Mayford Knife, RD, LDN, CDCES Registered Dietitian II Certified Diabetes Care and Education Specialist Pager: [phone] After hours Pager: [phone]

## 2019-09-27 NOTE — TOC Initial Note (Signed)
Transition of Care [facility]) - Initial/Assessment Note    Patient Details  Name: [name] Young MRN: [id number] Date of Birth: September 29, 1951  [facility]) CM/SW Contact:    Marilu Favre, RN Phone Number: 09/27/2019, 4:16 PM  Clinical Narrative:                  Confirmed face sheet information with patient. Patient active with PCP has transportation to appointments. Patient from home with husband with [facility] Expected Discharge Plan: Home/Self Care Barriers to Discharge: Continued Medical Work up   Patient Goals and CMS Choice Patient states their goals for this hospitalization and ongoing recovery are:: to go home CMS Medicare.gov Compare Post Acute Care list provided to:: Patient Choice offered to / list presented to : NA  Expected Discharge Plan and Services Expected Discharge Plan: Home/Self Care       Living arrangements for the past 2 months: Single Family Home                 DME Arranged: N/A         HH Arranged: NA          Prior Living Arrangements/Services Living arrangements for the past 2 months: Single Family Home Lives with:: Spouse Patient language and need for interpreter reviewed:: Yes Do you feel safe going back to the place where you live?: Yes      Need for Family Participation in Patient Care: Yes (Comment) Care giver support system in place?: Yes (comment)   Criminal Activity/Legal Involvement Pertinent to Current Situation/Hospitalization: No - Comment as needed  Activities of Daily Living Home Assistive Devices/Equipment: Other (Comment) ADL Screening (condition at time of admission) Patient's cognitive ability adequate to safely complete daily activities?: Yes Is the patient deaf or have difficulty hearing?: No Does the patient have difficulty seeing, even when wearing glasses/contacts?: No Does the patient have difficulty concentrating, remembering, or making decisions?: No Patient able to express need for assistance with  ADLs?: Yes Does the patient have difficulty dressing or bathing?: No Independently performs ADLs?: Yes (appropriate for developmental age) Does the patient have difficulty walking or climbing stairs?: No Weakness of Legs: None Weakness of Arms/Hands: Both  Permission Sought/Granted   Permission granted to share information with : No              Emotional Assessment Appearance:: Appears stated age Attitude/Demeanor/Rapport: Engaged Affect (typically observed): Accepting Orientation: : Oriented to Self, Oriented to Place, Oriented to  Time, Oriented to Situation Alcohol / Substance Use: Not Applicable Psych Involvement: No (comment)  Admission diagnosis:  Dehydration [E86.0] Nausea & vomiting [R11.2] Elevated LFTs [R79.89] Elevated bilirubin [R17] Non-intractable vomiting with nausea, unspecified vomiting type [R11.2] Nausea and vomiting [R11.2] Patient Active Problem List   Diagnosis Date Noted  . Nausea and vomiting 09/26/2019  . Nausea & vomiting 09/25/2019  . Abnormal liver function 09/25/2019  . Hypokalemia 09/25/2019  . Acute lower UTI 09/25/2019  . Choledocholithiasis   . Biliary obstruction 09/21/2019  . Pancreatitis 09/21/2019  . Cholecystitis with cholelithiasis 02/24/2019  . Seasonal allergies 10/30/2012   PCP:  Seward Brittainy, MD Pharmacy:   [id number] - [place], [place]. AT [address]. [place] [address] Phone: [phone] Fax: [phone]     Social Determinants of Health (SDOH) Interventions    Readmission Risk Interventions No flowsheet data found.

## 2019-09-27 NOTE — Progress Notes (Signed)
Patient reports some nausea, but no active vomiting.  She is able to tolerate small amounts of liquids.   On exam, the patient is in no acute distress.  She has a somewhat detached affect but seems mentally coherent.  Vital signs are stable.  Liver chemistries are about 30% improved overnight, thereby continuing progressive gradual downward trend from recent ERCP (no major "bump" at time of this admission, compared to discharge values), and white count remains normal, but is lower than yesterday.  Hemoglobin has dropped about 1 g overnight, associated with a drop in BUN, suggesting hydration and equilibration as the cause.  On presentation to the emergency room, her BUN was 26, as compared to 11 the day before.  Now it is back down to 15.   Impression:   1. Resolving acute gastroenteritis, perhaps food poisoning from the chicken she ate prior to admission.  No clinical or biochemical features to suggest pancreatitis or biliary obstruction from her ERCP.  2.  Drop in hemoglobin (now about 2.5 g from time of her ERCP) with coffee-ground emesis and transient rise in BUN, raising the question of a post sphincterotomy bleed  Recommendations:  1.  Continue clear liquid diet while nauseated.  I encouraged the patient to take frequent, small amounts rather than large amounts at a time.  2.  Continue IV Protonix.  Since the patient seems to have stabilized, I do not see a need to increase it to twice daily dosing.  Once she is taking reliably p.o., we could convert to oral Protonix.  3.  Hopefully will be able to advance diet slightly tomorrow  4.  We will continue to monitor labs.  Cleotis Nipper, M.D. Pager [phone] If no answer or after 5 PM call [phone]

## 2019-09-28 LAB — CBC
HCT: 29.4 % — ABNORMAL LOW (ref 36.0–46.0)
Hemoglobin: 9.8 g/dL — ABNORMAL LOW (ref 12.0–15.0)
MCH: 30.2 pg (ref 26.0–34.0)
MCHC: 33.3 g/dL (ref 30.0–36.0)
MCV: 90.7 fL (ref 80.0–100.0)
Platelets: 237 10*3/uL (ref 150–400)
RBC: 3.24 MIL/uL — ABNORMAL LOW (ref 3.87–5.11)
RDW: 12.5 % (ref 11.5–15.5)
WBC: 4.6 10*3/uL (ref 4.0–10.5)
nRBC: 0 % (ref 0.0–0.2)

## 2019-09-28 LAB — BASIC METABOLIC PANEL
Anion gap: 8 (ref 5–15)
BUN: 12 mg/dL (ref 8–23)
CO2: 23 mmol/L (ref 22–32)
Calcium: 8.9 mg/dL (ref 8.9–10.3)
Chloride: 111 mmol/L (ref 98–111)
Creatinine, Ser: 0.76 mg/dL (ref 0.44–1.00)
GFR calc Af Amer: >60 mL/min (ref >60)
GFR calc non Af Amer: >60 mL/min (ref >60)
Glucose, Bld: 106 mg/dL — ABNORMAL HIGH (ref 70–99)
Potassium: 3.5 mmol/L (ref 3.5–5.1)
Sodium: 142 mmol/L (ref 135–145)

## 2019-09-28 MED ORDER — PANTOPRAZOLE SODIUM 40 MG PO TBEC
40.0000 mg | DELAYED_RELEASE_TABLET | Freq: Two times a day (BID) | ORAL | Status: DC
  Administered 2019-09-28 – 2019-09-29 (×2): 40 mg via ORAL
  Filled 2019-09-28 (×2): qty 1

## 2019-09-28 NOTE — Progress Notes (Signed)
Improvement in symptoms noted, agree with plan for advancement to soft diet.  So far, she has eaten about one third of her lunch tray and is walking about the room in no distress.  She had chicken, rice, and broccoli.  She reports 1 semiformed stool today, the first since the stool she had on the night she was admitted.  Hemoglobin stable, no evidence of further GI blood loss.  Impression: Resolving undetermined GI process, conceivably a post sphincterotomy bleed, possibly an infectious gastroenteritis.  Plan: Continue observation with diet advancement.  Have converted pantoprazole to oral.  Cleotis Nipper, M.D. Pager [phone] If no answer or after 5 PM call [phone]

## 2019-09-28 NOTE — Progress Notes (Signed)
PROGRESS NOTE    [name] Young  TSV:[id number] DOB: 1951-03-13 DOA: 09/25/2019 PCP: Seward Samarrah, MD  Brief Narrative: 68 year old female with history of asthma, bronchitis, dyslipidemia, endometriosis underwent laparoscopic cholecystectomy in May/2020, was admitted from 12/8-12/11 with acute gallstone pancreatitis, she had an MRCP which showed CBD obstruction, underwent ERCP and sphincterotomy without stent placement 12/10 -She clinically improved and was discharged home 12/11 on a soft diet. -Presented back to the emergency room 12/12 evening with nausea vomiting and hematemesis, improving slowly  Assessment & Plan:      Nausea & vomiting -Likely residual symptoms from recent gallstone pancreatitis, GERD, lipase and LFTs are improving, very poor historian  -Per gastroenterology gastroenteritis or food poisoning are possibilities -CT abdomen pelvis - left hydronephrosis noted, this was not seen on prior CT last week, urinalysis with mild hematuria but not suggestive of infection, pancreas and gallbladder fossa was unremarkable [facility] gastroenterology following -No further vomiting all day yesterday, will advance to soft diet today  Recent gallstone pancreatitis -Status post sphincterotomy and stone extraction 12/10 -CT unremarkable, LFTs improving -Lipase normal on admission -Advance to soft diet  Left hydronephrosis/hematuria -resolved on [place] 12/14, when she presented back to the ED 12/12 she was noted to have an incidental finding of left hydronephrosis, this was not seen on previous CT and UA noted mild hematuria -Called and discussed with urology Dr. Alinda Money over the weekend and Dr. Jeffie Pollock yesterday, repeat ultrasound yesterday 12/14 showed resolution of hydronephrosis -No flank pain or tenderness, no clear explanation for this, urology recommended follow-up  Mild AKI -Due to dehydration and vomiting, resolved -Stop IV fluids today  GERD -Continue PPI  Hypokalemia -Due to  vomiting, replaced  DVT prophylaxis: SCDs Code Status: Full code Family Communication: No family at bedside Disposition Plan: Home pending improvement in nausea and vomiting  Consultants:   [facility]   Procedures:   Antimicrobials:    Subjective: -No more abdominal pain, no further nausea or vomiting since yesterday afternoon  Objective: Vitals:   09/27/19 1240 09/27/19 1820 09/27/19 2310 09/28/19 0509  BP: 125/64 (!) 138/58 138/76 128/71  Pulse: 66 71 68 63  Resp: 16 18 18 18   Temp: 97.9 F (36.6 C) 98.2 F (36.8 C) 99.1 F (37.3 C) 98.2 F (36.8 C)  TempSrc: Oral Oral Oral Oral  SpO2: 97% 96% 97% 95%  Weight:    65.3 kg  Height:        Intake/Output Summary (Last 24 hours) at 09/28/2019 1123 Last data filed at 09/27/2019 1800 Gross per 24 hour  Intake 228.99 ml  Output -  Net 228.99 ml   Filed Weights   09/26/19 0652 09/28/19 0509  Weight: 69 kg 65.3 kg    Examination:  Gen: Awake alert oriented x3, extremely flat, unusual affect HEENT: PERRLA, Neck supple, no JVD Lungs: Clear  CVS: RRR,No Gallops,Rubs or new Murmurs Abd: Soft, nontender, nondistended, bowel sounds present, no CVA tenderness Extremities: No edema Skin: no new rashes Psychiatry: Flat affect   Data Reviewed:   CBC: Recent Labs  Lab 09/22/19 0949 09/24/19 0542 09/25/19 1937 09/26/19 0440 09/27/19 0533 09/28/19 0633  WBC 4.2 4.1 13.7* 10.1 6.0 4.6  NEUTROABS 2.6 2.3  --   --   --   --   HGB 13.7 12.3 13.4 11.0* 9.7* 9.8*  HCT 40.5 36.0 39.3 32.2* 28.2* 29.4*  MCV 91.8 90.0 89.9 90.7 91.0 90.7  PLT 244 227 296 227 206 923   Basic Metabolic Panel: Recent Labs  Lab 09/24/19  1610 09/25/19 1937 09/26/19 0440 09/27/19 0533 09/28/19 0633  NA 140 138 142 144 142  K 3.4* 3.2* 3.2* 3.6 3.5  CL 106 105 110 113* 111  CO2 GLUCOSE 94 130* 119* 85 106*  BUN 11 26* CREATININE 1.10* 1.21* 1.37* 0.79 0.76  CALCIUM 9.4 9.7 8.8* 8.9 8.9    GFR: Estimated Creatinine Clearance: 58.3 mL/min (by C-G formula based on SCr of 0.76 mg/dL). Liver Function Tests: Recent Labs  Lab 09/23/19 0332 09/24/19 0542 09/25/19 1937 09/26/19 0440 09/27/19 0533  AST 71* 57* 73* 55* 30  ALT 148* 130* 139* 110* 79*  ALKPHOS 253* 230* 198* 146* 122  BILITOT 5.0* 4.2* 3.7* 2.9* 2.9*  PROT 5.9* 6.3* 7.5 5.8* 5.4*  ALBUMIN 3.0* 3.1* 3.8 2.9* 2.6*   Recent Labs  Lab 09/21/19 1206 09/22/19 0949 09/23/19 0332 09/25/19 1937  LIPASE 3,204* 384* 139* 50   Recent Labs  Lab 09/25/19 2233  AMMONIA 18   Coagulation Profile: Recent Labs  Lab 09/26/19 0440  INR 1.0   Cardiac Enzymes: No results for input(s): CKTOTAL, CKMB, CKMBINDEX, TROPONINI in the last 168 hours. BNP (last 3 results) No results for input(s): PROBNP in the last 8760 hours. HbA1C: No results for input(s): HGBA1C in the last 72 hours. CBG: Recent Labs  Lab 09/23/19 0627  GLUCAP 95   Lipid Profile: No results for input(s): CHOL, HDL, LDLCALC, TRIG, CHOLHDL, LDLDIRECT in the last 72 hours. Thyroid Function Tests: No results for input(s): TSH, T4TOTAL, FREET4, T3FREE, THYROIDAB in the last 72 hours. Anemia Panel: No results for input(s): VITAMINB12, FOLATE, FERRITIN, TIBC, IRON, RETICCTPCT in the last 72 hours. Urine analysis:    Component Value Date/Time   COLORURINE AMBER (A) 09/25/2019 1912   APPEARANCEUR CLEAR 09/25/2019 1912   LABSPEC 1.024 09/25/2019 1912   PHURINE 5.0 09/25/2019 1912   GLUCOSEU NEGATIVE 09/25/2019 1912   HGBUR LARGE (A) 09/25/2019 1912   BILIRUBINUR NEGATIVE 09/25/2019 1912   KETONESUR 5 (A) 09/25/2019 1912   PROTEINUR 30 (A) 09/25/2019 1912   UROBILINOGEN 1.0 07/29/2015 0209   NITRITE NEGATIVE 09/25/2019 1912   LEUKOCYTESUR NEGATIVE 09/25/2019 1912   Sepsis Labs: (procalcitonin:4,lacticidven:4)  ) Recent Results (from the past 240 hour(s))  Respiratory Panel by RT PCR (Flu A&B, Covid) - Nasopharyngeal Swab     Status:  None   Collection Time: 09/21/19  5:32 PM   Specimen: Nasopharyngeal Swab  Result Value Ref Range Status   SARS Coronavirus 2 by RT PCR NEGATIVE NEGATIVE Final    Comment: (NOTE) SARS-CoV-2 target nucleic acids are NOT DETECTED. The SARS-CoV-2 RNA is generally detectable in upper respiratoy specimens during the acute phase of infection. The lowest concentration of SARS-CoV-2 viral copies this assay can detect is 131 copies/mL. A negative result does not preclude SARS-Cov-2 infection and should not be used as the sole basis for treatment or other patient management decisions. A negative result may occur with  improper specimen collection/handling, submission of specimen other than nasopharyngeal swab, presence of viral mutation(s) within the areas targeted by this assay, and inadequate number of viral copies (<131 copies/mL). A negative result must be combined with clinical observations, patient history, and epidemiological information. The expected result is Negative. Fact Sheet for Patients:  https://www.moore.com/ Fact Sheet for Healthcare Providers:  https://www.young.biz/ This test is not yet ap proved or cleared by the [place] FDA and  has been authorized for detection and/or diagnosis of SARS-CoV-2 by FDA under  an Emergency Use Authorization (EUA). This EUA will remain  in effect (meaning this test can be used) for the duration of the COVID-19 declaration under Section 564(b)(1) of the Act, 21 U.S.C. section 360bbb-3(b)(1), unless the authorization is terminated or revoked sooner.    Influenza A by PCR NEGATIVE NEGATIVE Final   Influenza B by PCR NEGATIVE NEGATIVE Final    Comment: (NOTE) The Xpert Xpress SARS-CoV-2/FLU/RSV assay is intended as an aid in  the diagnosis of influenza from Nasopharyngeal swab specimens and  should not be used as a sole basis for treatment. Nasal washings and  aspirates are unacceptable for Xpert  Xpress SARS-CoV-2/FLU/RSV  testing. Fact Sheet for Patients: https://www.moore.com/https://www.fda.gov/media/142436/download Fact Sheet for Healthcare Providers: https://www.young.biz/https://www.fda.gov/media/142435/download This test is not yet approved or cleared by the [place] FDA and  has been authorized for detection and/or diagnosis of SARS-CoV-2 by  FDA under an Emergency Use Authorization (EUA). This EUA will remain  in effect (meaning this test can be used) for the duration of the  Covid-19 declaration under Section 564(b)(1) of the Act, 21  U.S.C. section 360bbb-3(b)(1), unless the authorization is  terminated or revoked. Performed at [facility], [address] [place], [place] [address]   Surgical pcr screen     Status: Abnormal   Collection Time: 09/22/19  7:50 PM   Specimen: Nasal Mucosa; Nasal Swab  Result Value Ref Range Status   MRSA, PCR POSITIVE (A) NEGATIVE Final   Staphylococcus aureus POSITIVE (A) NEGATIVE Final    Comment: RESULT CALLED TO, READ BACK BY AND VERIFIED WITH: E . OFORI RN 09/22/19 2139 JDW (NOTE) The Xpert SA Assay (FDA approved for NASAL specimens in patients 66 years of age and older), is one component of a comprehensive surveillance program. It is not intended to diagnose infection nor to guide or monitor treatment. Performed at [facility], [address] [place], [place] [address]   SARS CORONAVIRUS 2 (TAT 6-24 HRS) Nasopharyngeal Nasopharyngeal Swab     Status: None   Collection Time: 09/26/19 12:06 AM   Specimen: Nasopharyngeal Swab  Result Value Ref Range Status   SARS Coronavirus 2 NEGATIVE NEGATIVE Final    Comment: (NOTE) SARS-CoV-2 target nucleic acids are NOT DETECTED. The SARS-CoV-2 RNA is generally detectable in upper and lower respiratory specimens during the acute phase of infection. Negative results do not preclude SARS-CoV-2 infection, do not rule out co-infections with other pathogens, and should not be used as the sole basis for treatment  or other patient management decisions. Negative results must be combined with clinical observations, patient history, and epidemiological information. The expected result is Negative. Fact Sheet for Patients: HairSlick.nohttps://www.fda.gov/media/138098/download Fact Sheet for Healthcare Providers: quierodirigir.comhttps://www.fda.gov/media/138095/download This test is not yet approved or cleared by the [place] FDA and  has been authorized for detection and/or diagnosis of SARS-CoV-2 by FDA under an Emergency Use Authorization (EUA). This EUA will remain  in effect (meaning this test can be used) for the duration of the COVID-19 declaration under Section 56 4(b)(1) of the Act, 21 U.S.C. section 360bbb-3(b)(1), unless the authorization is terminated or revoked sooner. Performed at [facility], [address] [place], [place] [address]   MRSA PCR Screening     Status: None   Collection Time: 09/27/19  7:22 AM   Specimen: Nasal Mucosa; Nasopharyngeal  Result Value Ref Range Status   MRSA by PCR NEGATIVE NEGATIVE Final    Comment:        The GeneXpert  MRSA Assay (FDA approved for NASAL specimens only), is one component of a comprehensive MRSA colonization surveillance program. It is not intended to diagnose MRSA infection nor to guide or monitor treatment for MRSA infections. Performed at [facility], [address], [place], [place] [address]          Radiology Studies: US RENAL  Result Date: 09/27/2019 CLINICAL DATA:  Followup hydronephrosis seen by CT on the left. EXAM: RENAL / URINARY TRACT ULTRASOUND COMPLETE COMPARISON:  CT done yesterday. FINDINGS: Right Kidney: Renal measurements: 10.7 x 4.2 x 5.0 cm = volume: 119 mL. 2.5 cm cyst in the upper pole. Otherwise negative. Left Kidney: Renal measurements: 10.8 x 5.0 x 5.5 cm. = volume: 155 mL. Fullness of the renal collecting system has resolved. There is abundant fat in the renal hilum. No sign of stone or mass. Bladder: Appears  normal for degree of bladder distention. Other: None. IMPRESSION: Resolution of left-sided hydronephrosis. No specific cause identified. Electronically Signed   By: Paulina Fusi M.D.   On: 09/27/2019 13:54        Scheduled Meds: . pantoprazole (PROTONIX) IV  40 mg Intravenous Q24H  . sodium chloride flush  3 mL Intravenous Once   Continuous Infusions:    LOS: 2 days   Time spent:  Zannie Cove, MD [facility]  09/28/2019, 11:23 AM

## 2019-09-29 LAB — CBC
HCT: 31.9 % — ABNORMAL LOW (ref 36.0–46.0)
Hemoglobin: 11.1 g/dL — ABNORMAL LOW (ref 12.0–15.0)
MCH: 31 pg (ref 26.0–34.0)
MCHC: 34.8 g/dL (ref 30.0–36.0)
MCV: 89.1 fL (ref 80.0–100.0)
Platelets: 297 10*3/uL (ref 150–400)
RBC: 3.58 MIL/uL — ABNORMAL LOW (ref 3.87–5.11)
RDW: 12.3 % (ref 11.5–15.5)
WBC: 4.6 10*3/uL (ref 4.0–10.5)
nRBC: 0 % (ref 0.0–0.2)

## 2019-09-29 LAB — COMPREHENSIVE METABOLIC PANEL
ALT: 65 U/L — ABNORMAL HIGH (ref 0–44)
AST: 31 U/L (ref 15–41)
Albumin: 3.1 g/dL — ABNORMAL LOW (ref 3.5–5.0)
Alkaline Phosphatase: 123 U/L (ref 38–126)
Anion gap: 10 (ref 5–15)
BUN: 8 mg/dL (ref 8–23)
CO2: 24 mmol/L (ref 22–32)
Calcium: 9.3 mg/dL (ref 8.9–10.3)
Chloride: 108 mmol/L (ref 98–111)
Creatinine, Ser: 0.97 mg/dL (ref 0.44–1.00)
GFR calc Af Amer: >60 mL/min (ref >60)
GFR calc non Af Amer: >60 mL/min (ref >60)
Glucose, Bld: 103 mg/dL — ABNORMAL HIGH (ref 70–99)
Potassium: 3.2 mmol/L — ABNORMAL LOW (ref 3.5–5.1)
Sodium: 142 mmol/L (ref 135–145)
Total Bilirubin: 2.3 mg/dL — ABNORMAL HIGH (ref 0.3–1.2)
Total Protein: 6.1 g/dL — ABNORMAL LOW (ref 6.5–8.1)

## 2019-09-29 MED ORDER — ONDANSETRON 4 MG PO TBDP: 4.0000 mg | ORAL_TABLET | Freq: Four times a day (QID) | ORAL | 0 refills | Status: DC | PRN

## 2019-09-29 NOTE — Progress Notes (Signed)
Patient is improved, tolerating diet, up and walking in the room, no diarrhea, no nausea or vomiting.  She is approximately a week status post ERCP and sphincterotomy, with progressive improvement in liver chemistries, including today's value which showed total bilirubin 2.3 (down from 4.2 at time of hospital discharge a week ago following ERCP), ALT 65 (down from 130), AST 31 (down from 57).  Concerning the patient's coffee-ground emesis, her hemoglobin today is back up to 11.1, which is just a gram lower than at time of discharge.  Okay for discharge on regular diet, from the GI tract standpoint.    Dr. Perley Jain plan had been to recheck liver chemistries a week following discharge, (which is today), to make sure they had normalized.  Since the LFTs are improved but not completely back to normal, I have notified him of the patient's status and he will contact the patient if he feels further follow-up is warranted.  Please call us if you have any questions concerning this patient's care.  Cleotis Nipper, M.D. Pager [phone] If no answer or after 5 PM call [phone]

## 2019-09-29 NOTE — Progress Notes (Signed)
Nutrition Follow-up  DOCUMENTATION CODES:   Not applicable  INTERVENTION:   -Ensure Enlive po BID, each supplement provides 350 kcal and 20 grams of protein -MVI with minerals daily  NUTRITION DIAGNOSIS:   Inadequate oral intake related to altered GI function as evidenced by NPO status.  Progressing; advanced to soft diet on 09/28/19  GOAL:   Patient will meet greater than or equal to 90% of their needs  Progressing   MONITOR:   PO intake, Supplement acceptance, Diet advancement, Labs, Weight trends, Skin, I & O's  REASON FOR ASSESSMENT:   Malnutrition Screening Tool    ASSESSMENT:   [name] Young  is a 68 y.o. female,  w hyperlipidemia, asthma, endometriosis, s/p lap chole 02/25/2019 w recent admission 09/21/2019 for acute gallstone pancreatitis s/p ERCP w sphincterotomy  09/23/2019  Presents with c/o abdominal pain.  Pt states that the pain is generalized.  N/v started around 7pm,  ? Coffee ground emesis.   Pt denies fever, chills, cough, cp, palp, sob, diarrhea, brbpr.  12/15- advanced to soft diet  Reviewed I/O's: +3 ml x 24 hours and +4.2 L since admission  Attempted to speak with pt via phone, however, no answer.   Per GI notes, pt consumed about 1/3 of dinner tray last night and has 1 semi-formed stool. Pt also experienced upper chest pain when drinking 2 days prior that evening, likely related to GERD, per RN notes.   Given decreased oral intake, pt would benefit from addition of oral nutrition supplements now that diet is advanced.   Labs reviewed.   Diet Order:   Diet Order            Diet - low sodium heart healthy        DIET SOFT Room service appropriate? Yes; Fluid consistency: Thin  Diet effective now              EDUCATION NEEDS:   No education needs have been identified at this time  Skin:  Skin Assessment: Reviewed RN Assessment  Last BM:  09/28/19  Height:   Ht Readings from Last 1 Encounters:  09/26/19 5' (1.524 m)    Weight:    Wt Readings from Last 1 Encounters:  09/29/19 64.1 kg    Ideal Body Weight:  45.5 kg  BMI:  Body mass index is 27.6 kg/m.  Estimated Nutritional Needs:   Kcal:  5409-8119  Protein:  90-105 grams  Fluid:  > 1.7 L    Takako Minckler A. Jimmye Norman, RD, LDN, [place] [facility] Registered Dietitian II Certified Diabetes Care and Education Specialist Pager: [phone] After hours Pager: [phone]

## 2019-09-29 NOTE — Progress Notes (Signed)
RN gave pt discharge instructions and pt stated understanding. IV has been removed, 1 new prescription escribed to home pharmacy. Pt feeling well she packed up all her belongings and  Get dressed.

## 2019-09-29 NOTE — Discharge Summary (Signed)
Physician Discharge Summary  [name] Young GXQ:[id number] DOB: 01-02-1951 DOA: 09/25/2019  PCP: Seward Shelbie, MD  Admit date: 09/25/2019 Discharge date: 09/29/2019  Admitted From: home Discharge disposition: home   Recommendations for Outpatient Follow-Up:   1. Follow LFTs 2. BMP for hypokalemia at next visit  Discharge Diagnosis:   Principal Problem:   Nausea & vomiting Active Problems:   Abnormal liver function   Hypokalemia   Acute lower UTI   Nausea and vomiting    Discharge Condition: Improved.  Diet recommendation: soft  Wound care: None.  Code status: Full.   History of Present Illness:   [name] Young  is a 68 y.o. female,  w hyperlipidemia, asthma, endometriosis, s/p lap chole 02/25/2019 w recent admission 09/21/2019 for acute gallstone pancreatitis s/p ERCP w sphincterotomy  09/23/2019  Presents with c/o abdominal pain.  Pt states that the pain is generalized.  N/v started around 7pm,  ? Coffee ground emesis.   Pt denies fever, chills, cough, cp, palp, sob, diarrhea, brbpr.    Hospital Course by Problem:   Nausea & vomiting Likely residual symptoms from recent gallstone pancreatitis, GERD, lipase and LFTs are improving, very poor historian  -Per gastroenterology gastroenteritis or food poisoning are possibilities -CT abdomen pelvis - left hydronephrosis noted, this was not seen on prior CT last week, urinalysis with mild hematuria but not suggestive of infection, pancreas and gallbladder fossa was unremarkable -[facility]:Okay for discharge on regular diet, from the GI tract standpoint.   Dr. Perley Jain plan had been to recheck liver chemistries a week following discharge, (which is today), to make sure they had normalized.  Since the LFTs are improved but not completely back to normal, I have notified him of the patient's status and he will contact the patient if he feels further follow-up is warranted. -No further vomiting - tolerating diet  advancement  Recent gallstone pancreatitis -Status post sphincterotomy and stone extraction 12/10 -CT unremarkable, LFTs improving -Lipase normal on admission  Left hydronephrosis/hematuria -resolved on [place] 12/14, when she presented back to the ED 12/12 she was noted to have an incidental finding of left hydronephrosis, this was not seen on previous CT and UA noted mild hematuria -Called and discussed with urology Dr. Alinda Money over the weekend and Dr. Jeffie Pollock yesterday, repeat ultrasound yesterday 12/14 showed resolution of hydronephrosis -No flank pain or tenderness, no clear explanation for this, urology recommended follow-up  Mild AKI -Due to dehydration and vomiting, resolved  GERD -Continue PPI  Hypokalemia -Due to vomiting, replaced    Medical Consultants:    GI Urology (phone)  Discharge Exam:   Vitals:   09/28/19 2359 09/29/19 0526  BP: 101/79 (!) 145/67  Pulse: 66 63  Resp: 18 18  Temp: 99 F (37.2 C) 98.2 F (36.8 C)  SpO2: 95% 95%   Vitals:   09/28/19 1848 09/28/19 2359 09/29/19 0526 09/29/19 0629  BP: 130/68 101/79 (!) 145/67   Pulse: 68 66 63   Resp: 18 18 18    Temp: 98 F (36.7 C) 99 F (37.2 C) 98.2 F (36.8 C)   TempSrc: Oral Oral Oral   SpO2: 97% 95% 95%   Weight:    64.1 kg  Height:        General exam: Appears calm and comfortable  The results of significant diagnostics from this hospitalization (including imaging, microbiology, ancillary and laboratory) are listed below for reference.     Procedures and Diagnostic Studies:   CT ABDOMEN PELVIS WO CONTRAST  Result Date: 09/26/2019 CLINICAL DATA:  Abdominal pain EXAM: CT ABDOMEN AND PELVIS WITHOUT CONTRAST TECHNIQUE: Multidetector CT imaging of the abdomen and pelvis was performed following the standard protocol without IV contrast. COMPARISON:  09/20/2019 FINDINGS: Lower chest: There is atelectasis versus scarring in the lingula.The heart size is normal. Hepatobiliary: The liver is  normal. Status post cholecystectomy.The previously demonstrated biliary ductal dilatation has significantly improved since the prior study. Pancreas: Normal contours without ductal dilatation. No peripancreatic fluid collection. Spleen: A few splenic hypoattenuating lesions are identified, not well characterized on this exam. Adrenals/Urinary Tract: --Adrenal glands: No adrenal hemorrhage. --Right kidney/ureter: No hydronephrosis or perinephric hematoma. --Left kidney/ureter: There is moderate left-sided hydronephrosis without evidence for a radiopaque obstructing kidney stones. This is new since prior study. Of note, no radiopaque stone was visualized on the recent prior CT of the abdomen. --Urinary bladder: Unremarkable. Stomach/Bowel: --Stomach/Duodenum: No hiatal hernia or other gastric abnormality. Normal duodenal course and caliber. --Small bowel: No dilatation or inflammation. --Colon: Rectosigmoid diverticulosis without acute inflammation. --Appendix: Normal. Vascular/Lymphatic: Atherosclerotic calcification is present within the non-aneurysmal abdominal aorta, without hemodynamically significant stenosis. --No retroperitoneal lymphadenopathy. --No mesenteric lymphadenopathy. --No pelvic or inguinal lymphadenopathy. Reproductive: Unremarkable Other: No ascites or free air. The abdominal wall is normal. Musculoskeletal. No acute displaced fractures. IMPRESSION: 1. There is moderate left-sided hydronephrosis without evidence for a radiopaque obstructing kidney stone. Of note, no radiopaque stone was visualized on the recent CT dated September 20, 2019. 2. Status post cholecystectomy. Resolution of previously demonstrated biliary ductal dilatation. Aortic Atherosclerosis (ICD10-I70.0). Electronically Signed   By: Katherine Mantle M.D.   On: 09/26/2019 02:02   DG CHEST PORT 1 VIEW  Result Date: 09/26/2019 CLINICAL DATA:  Nausea and vomiting EXAM: PORTABLE CHEST 1 VIEW COMPARISON:  09/04/2019 FINDINGS:  Cardiac shadow is within normal limits. The lungs are well aerated bilaterally without focal infiltrate or sizable effusion. No acute bony abnormality is noted. IMPRESSION: No active disease. Electronically Signed   By: Alcide Clever M.D.   On: 09/26/2019 02:03     Labs:   Basic Metabolic Panel: Recent Labs  Lab 09/25/19 1937 09/26/19 0440 09/27/19 0533 09/28/19 0633 09/29/19 0711  NA 138 142 144 142 142  K 3.2* 3.2* 3.6 3.5 3.2*  CL 105 110 113* 111 108  CO2 22 24 22 23 24   GLUCOSE 130* 119* 85 106* 103*  BUN 26* 22 15 12 8   CREATININE 1.21* 1.37* 0.79 0.76 0.97  CALCIUM 9.7 8.8* 8.9 8.9 9.3   GFR Estimated Creatinine Clearance: 47.6 mL/min (by C-G formula based on SCr of 0.97 mg/dL). Liver Function Tests: Recent Labs  Lab 09/24/19 0542 09/25/19 1937 09/26/19 0440 09/27/19 0533 09/29/19 0711  AST 57* 73* 55* 30 31  ALT 130* 139* 110* 79* 65*  ALKPHOS 230* 198* 146* 122 123  BILITOT 4.2* 3.7* 2.9* 2.9* 2.3*  PROT 6.3* 7.5 5.8* 5.4* 6.1*  ALBUMIN 3.1* 3.8 2.9* 2.6* 3.1*   Recent Labs  Lab 09/22/19 0949 09/23/19 0332 09/25/19 1937  LIPASE 384* 139* 50   Recent Labs  Lab 09/25/19 2233  AMMONIA 18   Coagulation profile Recent Labs  Lab 09/26/19 0440  INR 1.0    CBC: Recent Labs  Lab 09/22/19 0949 09/24/19 0542 09/25/19 1937 09/26/19 0440 09/27/19 0533 09/28/19 0633 09/29/19 0711  WBC 4.2 4.1 13.7* 10.1 6.0 4.6 4.6  NEUTROABS 2.6 2.3  --   --   --   --   --   HGB 13.7 12.3 13.4 11.0*  9.7* 9.8* 11.1*  HCT 40.5 36.0 39.3 32.2* 28.2* 29.4* 31.9*  MCV 91.8 90.0 89.9 90.7 91.0 90.7 89.1  PLT 244 227 296 227 206 237 297   Cardiac Enzymes: No results for input(s): CKTOTAL, CKMB, CKMBINDEX, TROPONINI in the last 168 hours. BNP: Invalid input(s): POCBNP CBG: Recent Labs  Lab 09/23/19 0627  GLUCAP 95   D-Dimer No results for input(s): DDIMER in the last 72 hours. Hgb A1c No results for input(s): HGBA1C in the last 72 hours. Lipid Profile No  results for input(s): CHOL, HDL, LDLCALC, TRIG, CHOLHDL, LDLDIRECT in the last 72 hours. Thyroid function studies No results for input(s): TSH, T4TOTAL, T3FREE, THYROIDAB in the last 72 hours.  Invalid input(s): FREET3 Anemia work up No results for input(s): VITAMINB12, FOLATE, FERRITIN, TIBC, IRON, RETICCTPCT in the last 72 hours. Microbiology Recent Results (from the past 240 hour(s))  Respiratory Panel by RT PCR (Flu A&B, Covid) - Nasopharyngeal Swab     Status: None   Collection Time: 09/21/19  5:32 PM   Specimen: Nasopharyngeal Swab  Result Value Ref Range Status   SARS Coronavirus 2 by RT PCR NEGATIVE NEGATIVE Final    Comment: (NOTE) SARS-CoV-2 target nucleic acids are NOT DETECTED. The SARS-CoV-2 RNA is generally detectable in upper respiratoy specimens during the acute phase of infection. The lowest concentration of SARS-CoV-2 viral copies this assay can detect is 131 copies/mL. A negative result does not preclude SARS-Cov-2 infection and should not be used as the sole basis for treatment or other patient management decisions. A negative result may occur with  improper specimen collection/handling, submission of specimen other than nasopharyngeal swab, presence of viral mutation(s) within the areas targeted by this assay, and inadequate number of viral copies (<131 copies/mL). A negative result must be combined with clinical observations, patient history, and epidemiological information. The expected result is Negative. Fact Sheet for Patients:  https://www.moore.com/ Fact Sheet for Healthcare Providers:  https://www.young.biz/ This test is not yet ap proved or cleared by the [place] FDA and  has been authorized for detection and/or diagnosis of SARS-CoV-2 by FDA under an Emergency Use Authorization (EUA). This EUA will remain  in effect (meaning this test can be used) for the duration of the COVID-19 declaration under Section  564(b)(1) of the Act, 21 U.S.C. section 360bbb-3(b)(1), unless the authorization is terminated or revoked sooner.    Influenza A by PCR NEGATIVE NEGATIVE Final   Influenza B by PCR NEGATIVE NEGATIVE Final    Comment: (NOTE) The Xpert Xpress SARS-CoV-2/FLU/RSV assay is intended as an aid in  the diagnosis of influenza from Nasopharyngeal swab specimens and  should not be used as a sole basis for treatment. Nasal washings and  aspirates are unacceptable for Xpert Xpress SARS-CoV-2/FLU/RSV  testing. Fact Sheet for Patients: https://www.moore.com/ Fact Sheet for Healthcare Providers: https://www.young.biz/ This test is not yet approved or cleared by the [place] FDA and  has been authorized for detection and/or diagnosis of SARS-CoV-2 by  FDA under an Emergency Use Authorization (EUA). This EUA will remain  in effect (meaning this test can be used) for the duration of the  Covid-19 declaration under Section 564(b)(1) of the Act, 21  U.S.C. section 360bbb-3(b)(1), unless the authorization is  terminated or revoked. Performed at [facility], [address], [place], [place] [address]   Surgical pcr screen     Status: Abnormal   Collection Time: 09/22/19  7:50 PM   Specimen: Nasal Mucosa; Nasal Swab  Result Value Ref  Range Status   MRSA, PCR POSITIVE (A) NEGATIVE Final   Staphylococcus aureus POSITIVE (A) NEGATIVE Final    Comment: RESULT CALLED TO, READ BACK BY AND VERIFIED WITH: E . OFORI RN 09/22/19 2139 JDW (NOTE) The Xpert SA Assay (FDA approved for NASAL specimens in patients 66 years of age and older), is one component of a comprehensive surveillance program. It is not intended to diagnose infection nor to guide or monitor treatment. Performed at [facility], [address] [place], [place] [address]   SARS CORONAVIRUS 2 (TAT 6-24 HRS) Nasopharyngeal Nasopharyngeal Swab     Status: None   Collection Time: 09/26/19  12:06 AM   Specimen: Nasopharyngeal Swab  Result Value Ref Range Status   SARS Coronavirus 2 NEGATIVE NEGATIVE Final    Comment: (NOTE) SARS-CoV-2 target nucleic acids are NOT DETECTED. The SARS-CoV-2 RNA is generally detectable in upper and lower respiratory specimens during the acute phase of infection. Negative results do not preclude SARS-CoV-2 infection, do not rule out co-infections with other pathogens, and should not be used as the sole basis for treatment or other patient management decisions. Negative results must be combined with clinical observations, patient history, and epidemiological information. The expected result is Negative. Fact Sheet for Patients: HairSlick.nohttps://www.fda.gov/media/138098/download Fact Sheet for Healthcare Providers: quierodirigir.comhttps://www.fda.gov/media/138095/download This test is not yet approved or cleared by the [place] FDA and  has been authorized for detection and/or diagnosis of SARS-CoV-2 by FDA under an Emergency Use Authorization (EUA). This EUA will remain  in effect (meaning this test can be used) for the duration of the COVID-19 declaration under Section 56 4(b)(1) of the Act, 21 U.S.C. section 360bbb-3(b)(1), unless the authorization is terminated or revoked sooner. Performed at [facility], [address] [place], [place] [address]   MRSA PCR Screening     Status: None   Collection Time: 09/27/19  7:22 AM   Specimen: Nasal Mucosa; Nasopharyngeal  Result Value Ref Range Status   MRSA by PCR NEGATIVE NEGATIVE Final    Comment:        The GeneXpert MRSA Assay (FDA approved for NASAL specimens only), is one component of a comprehensive MRSA colonization surveillance program. It is not intended to diagnose MRSA infection nor to guide or monitor treatment for MRSA infections. Performed at [facility], [address] [place], [place] [address]      Discharge Instructions:   Discharge Instructions    Diet - low sodium  heart healthy   Complete by: As directed    Discharge instructions   Complete by: As directed    Soft bland diet   Increase activity slowly   Complete by: As directed      Allergies as of 09/29/2019      Reactions   Codeine Itching   Latex Dermatitis, Other (See Comments)   Irritates the skin   Mobic [meloxicam] Other (See Comments)   Dizzy/ lightheaded   Tape Dermatitis, Other (See Comments)   Paper tape is preferred      Medication List    STOP taking these medications   celecoxib 200 MG capsule Commonly known as: CELEBREX   sucralfate 1 g tablet Commonly known as: CARAFATE     TAKE these medications   acetaminophen 500 MG tablet Commonly known as: TYLENOL Take 2 tablets (1,000 mg total) by mouth every 8 (eight) hours as needed for mild pain.   bismuth subsalicylate 262 MG/15ML suspension Commonly known as: PEPTO BISMOL Take 30  mLs by mouth every 6 (six) hours as needed for indigestion.   HYDROcodone-acetaminophen 5-325 MG tablet Commonly known as: NORCO/VICODIN Take 1 tablet by mouth every 4 (four) hours as needed.   ondansetron 4 MG disintegrating tablet Commonly known as: Zofran ODT Take 1 tablet (4 mg total) by mouth every 6 (six) hours as needed for nausea.   pantoprazole 40 MG tablet Commonly known as: PROTONIX Take 1 tablet (40 mg total) by mouth daily.      Follow-up Information    Renford Dills, MD Follow up in 1 week(s).   Specialty: Internal Medicine Contact information: [address] [place] [phone]            Time coordinating discharge: 35 min  Signed:  Joseph Art DO  [facility] 09/29/2019, 9:01 AM

## 2019-10-05 DIAGNOSIS — R8290 Unspecified abnormal findings in urine: Secondary | ICD-10-CM | POA: Diagnosis not present

## 2019-10-05 DIAGNOSIS — N1330 Unspecified hydronephrosis: Secondary | ICD-10-CM | POA: Diagnosis not present

## 2019-10-05 DIAGNOSIS — K851 Biliary acute pancreatitis without necrosis or infection: Secondary | ICD-10-CM | POA: Diagnosis not present

## 2019-10-05 DIAGNOSIS — R112 Nausea with vomiting, unspecified: Secondary | ICD-10-CM | POA: Diagnosis not present

## 2019-10-20 DIAGNOSIS — R7989 Other specified abnormal findings of blood chemistry: Secondary | ICD-10-CM | POA: Diagnosis not present

## 2019-12-05 ENCOUNTER — Ambulatory Visit: Payer: Medicare PPO | Attending: Internal Medicine

## 2019-12-05 DIAGNOSIS — Z23 Encounter for immunization: Secondary | ICD-10-CM | POA: Insufficient documentation

## 2019-12-05 NOTE — Progress Notes (Signed)
   Covid-19 Vaccination Clinic  Name:  [name] Young    MRN: [id number] DOB: 10-Jan-1951  12/05/2019  [name] was observed post Covid-19 immunization for 15 minutes without incidence. She was provided with Vaccine Information Sheet and instruction to access the V-Safe system.   [name] was instructed to call 911 with any severe reactions post vaccine: Marland Kitchen Difficulty breathing  . Swelling of your face and throat  . A fast heartbeat  . A bad rash all over your body  . Dizziness and weakness    Immunizations Administered    Name Date Dose VIS Date Route   Pfizer COVID-19 Vaccine 12/05/2019  1:39 PM 0.3 mL 09/24/2019 Intramuscular   Manufacturer: [employer], [employer]   Lot: [id number]   NDC: [address]-9

## 2019-12-27 ENCOUNTER — Ambulatory Visit: Payer: Medicare HMO | Attending: Internal Medicine

## 2019-12-27 DIAGNOSIS — Z23 Encounter for immunization: Secondary | ICD-10-CM

## 2019-12-27 NOTE — Progress Notes (Signed)
   Covid-19 Vaccination Clinic  Name:  [name] Young    MRN: [id number] DOB: May 24, 1951  12/27/2019  [name] was observed post Covid-19 immunization for 15 minutes without incident. She was provided with Vaccine Information Sheet and instruction to access the V-Safe system.   [name] was instructed to call 911 with any severe reactions post vaccine: Marland Kitchen Difficulty breathing  . Swelling of face and throat  . A fast heartbeat  . A bad rash all over body  . Dizziness and weakness   Immunizations Administered    Name Date Dose VIS Date Route   Pfizer COVID-19 Vaccine 12/27/2019 12:36 PM 0.3 mL 09/24/2019 Intramuscular   Manufacturer: [employer], [employer]   Lot: [id number]   NDC: [address]-5

## 2020-01-10 DIAGNOSIS — H524 Presbyopia: Secondary | ICD-10-CM | POA: Diagnosis not present

## 2020-01-10 DIAGNOSIS — H40013 Open angle with borderline findings, low risk, bilateral: Secondary | ICD-10-CM | POA: Diagnosis not present

## 2020-02-01 ENCOUNTER — Other Ambulatory Visit: Payer: Self-pay | Admitting: Internal Medicine

## 2020-02-01 DIAGNOSIS — Z1231 Encounter for screening mammogram for malignant neoplasm of breast: Secondary | ICD-10-CM

## 2020-02-16 ENCOUNTER — Other Ambulatory Visit: Payer: Self-pay | Admitting: Internal Medicine

## 2020-02-16 DIAGNOSIS — E7800 Pure hypercholesterolemia, unspecified: Secondary | ICD-10-CM | POA: Diagnosis not present

## 2020-02-16 DIAGNOSIS — Z13220 Encounter for screening for lipoid disorders: Secondary | ICD-10-CM | POA: Diagnosis not present

## 2020-02-16 DIAGNOSIS — Z131 Encounter for screening for diabetes mellitus: Secondary | ICD-10-CM | POA: Diagnosis not present

## 2020-02-16 DIAGNOSIS — E2839 Other primary ovarian failure: Secondary | ICD-10-CM

## 2020-02-16 DIAGNOSIS — Z1389 Encounter for screening for other disorder: Secondary | ICD-10-CM | POA: Diagnosis not present

## 2020-02-16 DIAGNOSIS — E663 Overweight: Secondary | ICD-10-CM | POA: Diagnosis not present

## 2020-02-16 DIAGNOSIS — Z0000 Encounter for general adult medical examination without abnormal findings: Secondary | ICD-10-CM | POA: Diagnosis not present

## 2020-02-16 DIAGNOSIS — E538 Deficiency of other specified B group vitamins: Secondary | ICD-10-CM | POA: Diagnosis not present

## 2020-03-09 DIAGNOSIS — M542 Cervicalgia: Secondary | ICD-10-CM | POA: Diagnosis not present

## 2020-03-09 DIAGNOSIS — M2550 Pain in unspecified joint: Secondary | ICD-10-CM | POA: Diagnosis not present

## 2020-03-09 DIAGNOSIS — E669 Obesity, unspecified: Secondary | ICD-10-CM | POA: Diagnosis not present

## 2020-03-09 DIAGNOSIS — M150 Primary generalized (osteo)arthritis: Secondary | ICD-10-CM | POA: Diagnosis not present

## 2020-03-09 DIAGNOSIS — Z6830 Body mass index (BMI) 30.0-30.9, adult: Secondary | ICD-10-CM | POA: Diagnosis not present

## 2020-03-16 DIAGNOSIS — M8589 Other specified disorders of bone density and structure, multiple sites: Secondary | ICD-10-CM | POA: Diagnosis not present

## 2020-03-16 DIAGNOSIS — M8588 Other specified disorders of bone density and structure, other site: Secondary | ICD-10-CM | POA: Diagnosis not present

## 2020-03-16 DIAGNOSIS — Z1231 Encounter for screening mammogram for malignant neoplasm of breast: Secondary | ICD-10-CM | POA: Diagnosis not present

## 2020-03-16 DIAGNOSIS — M85852 Other specified disorders of bone density and structure, left thigh: Secondary | ICD-10-CM | POA: Diagnosis not present

## 2020-03-29 DIAGNOSIS — N6012 Diffuse cystic mastopathy of left breast: Secondary | ICD-10-CM | POA: Diagnosis not present

## 2020-03-29 DIAGNOSIS — N6002 Solitary cyst of left breast: Secondary | ICD-10-CM | POA: Diagnosis not present

## 2020-05-02 ENCOUNTER — Other Ambulatory Visit: Payer: Medicare HMO

## 2020-05-02 ENCOUNTER — Ambulatory Visit: Payer: Medicare HMO

## 2020-06-01 DIAGNOSIS — E538 Deficiency of other specified B group vitamins: Secondary | ICD-10-CM | POA: Diagnosis not present

## 2020-06-01 DIAGNOSIS — Z1211 Encounter for screening for malignant neoplasm of colon: Secondary | ICD-10-CM | POA: Diagnosis not present

## 2020-06-01 DIAGNOSIS — R5382 Chronic fatigue, unspecified: Secondary | ICD-10-CM | POA: Diagnosis not present

## 2020-06-01 DIAGNOSIS — R413 Other amnesia: Secondary | ICD-10-CM | POA: Diagnosis not present

## 2020-06-24 ENCOUNTER — Other Ambulatory Visit (HOSPITAL_COMMUNITY): Payer: Self-pay | Admitting: Nurse Practitioner

## 2020-06-24 DIAGNOSIS — U071 COVID-19: Secondary | ICD-10-CM

## 2020-06-24 NOTE — Progress Notes (Signed)
I connected by phone with [name] Young on 06/24/2020 at 6:03 PM to discuss the potential use of an new treatment for mild to moderate COVID-19 viral infection in non-hospitalized patients.  This patient is a 69 y.o. female that meets the FDA criteria for Emergency Use Authorization of casirivimab\imdevimab.  Has had direct exposure with covid positive patient and is symptomatic  Has mild or moderate COVID-19   Is ? 67 years of age and weighs ? 40 kg  Is NOT hospitalized due to COVID-19  Is NOT requiring oxygen therapy or requiring an increase in baseline oxygen flow rate due to COVID-19  Is within 10 days of symptom onset  Has at least one of the high risk factor(s) for progression to severe COVID-19 and/or hospitalization as defined in EUA.  Specific high risk criteria : Older age (>/= 67 yo)   I have spoken and communicated the following to the patient or parent/caregiver:  1. FDA has authorized the emergency use of casirivimab\imdevimab for the treatment of mild to moderate COVID-19 in adults and pediatric patients with positive results of direct SARS-CoV-2 viral testing who are 70 years of age and older weighing at least 40 kg, and who are at high risk for progressing to severe COVID-19 and/or hospitalization.  2. The significant known and potential risks and benefits of casirivimab\imdevimab, and the extent to which such potential risks and benefits are unknown.  3. Information on available alternative treatments and the risks and benefits of those alternatives, including clinical trials.  4. Patients treated with casirivimab\imdevimab should continue to self-isolate and use infection control measures (e.g., wear mask, isolate, social distance, avoid sharing personal items, clean and disinfect "high touch" surfaces, and frequent handwashing) according to CDC guidelines.   5. The patient or parent/caregiver has the option to accept or refuse casirivimab\imdevimab .  After reviewing  this information with the patient, The patient agreed to proceed with receiving casirivimab\imdevimab infusion and will be provided a copy of the Fact sheet prior to receiving the infusion.[name] Masse, DNP, AGNP-C [phone] (Infusion Center Hotline)

## 2020-06-26 ENCOUNTER — Ambulatory Visit (HOSPITAL_COMMUNITY)
Admission: RE | Admit: 2020-06-26 | Discharge: 2020-06-26 | Disposition: A | Discharge status: Home / Self Care | Payer: Medicare Other | Source: Ambulatory Visit | Attending: Pulmonary Disease | Admitting: Pulmonary Disease

## 2020-06-26 DIAGNOSIS — U071 COVID-19: Secondary | ICD-10-CM | POA: Diagnosis present

## 2020-06-26 DIAGNOSIS — Z23 Encounter for immunization: Secondary | ICD-10-CM | POA: Insufficient documentation

## 2020-06-26 MED ORDER — EPINEPHRINE 0.3 MG/0.3ML IJ SOAJ: 0.3000 mg | Freq: Once | INTRAMUSCULAR | Status: DC | PRN

## 2020-06-26 MED ORDER — DIPHENHYDRAMINE HCL 50 MG/ML IJ SOLN: 50.0000 mg | Freq: Once | INTRAMUSCULAR | Status: DC | PRN

## 2020-06-26 MED ORDER — ALBUTEROL SULFATE HFA 108 (90 BASE) MCG/ACT IN AERS: 2.0000 | INHALATION_SPRAY | Freq: Once | RESPIRATORY_TRACT | Status: DC | PRN

## 2020-06-26 MED ORDER — SODIUM CHLORIDE 0.9 % IV SOLN: INTRAVENOUS | Status: DC | PRN

## 2020-06-26 MED ORDER — METHYLPREDNISOLONE SODIUM SUCC 125 MG IJ SOLR: 125.0000 mg | Freq: Once | INTRAMUSCULAR | Status: DC | PRN

## 2020-06-26 MED ORDER — FAMOTIDINE IN NACL 20-0.9 MG/50ML-% IV SOLN: 20.0000 mg | Freq: Once | INTRAVENOUS | Status: DC | PRN

## 2020-06-26 MED ORDER — SODIUM CHLORIDE 0.9 % IV SOLN
1200.0000 mg | Freq: Once | INTRAVENOUS | Status: AC
  Administered 2020-06-26: 1200 mg via INTRAVENOUS
  Filled 2020-06-26: qty 10

## 2020-06-26 NOTE — Discharge Instructions (Signed)

## 2020-06-26 NOTE — Progress Notes (Signed)
  Diagnosis: COVID-19  Physician:Dr Wright  Procedure: Covid Infusion Clinic Med: casirivimab\imdevimab infusion - Provided patient with casirivimab\imdevimab fact sheet for patients, parents and caregivers prior to infusion.  Complications: No immediate complications noted.  Discharge: Discharged home   [name] Young 06/26/2020  

## 2020-07-30 IMAGING — CT CT ABD-PELV W/O CM
2 of 4 series · 16 of 46 positions shown, 18 images · non-contrast
Comparison: 09/20/2019

CLINICAL DATA: Abdominal pain

EXAM:
CT ABDOMEN AND PELVIS WITHOUT CONTRAST
TECHNIQUE: Multidetector CT imaging of the abdomen and pelvis was performed
following the standard protocol without IV contrast.

[Series 3: ap without · axial · non-contrast · 0.80mm/px · z∈[+127,+497]mm · 13 of 84 slices shown, 15 images]
[im 5/84  soft-tissue]
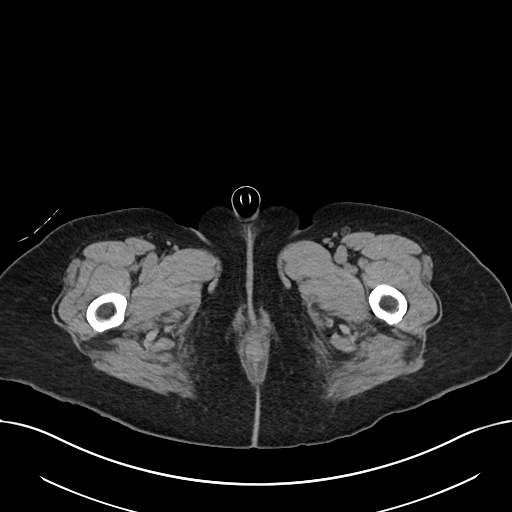
[im 5/84  bone]
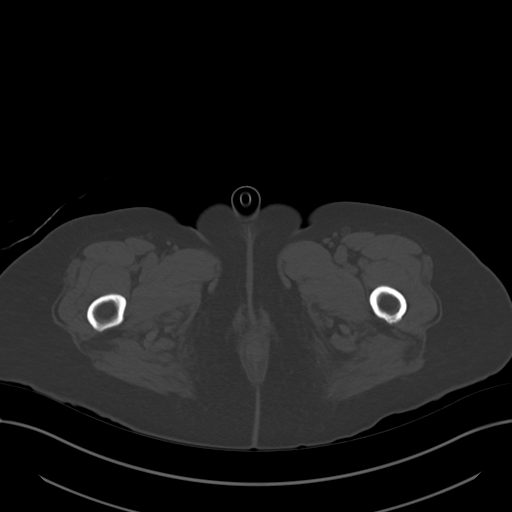
[im 10/84  soft-tissue]
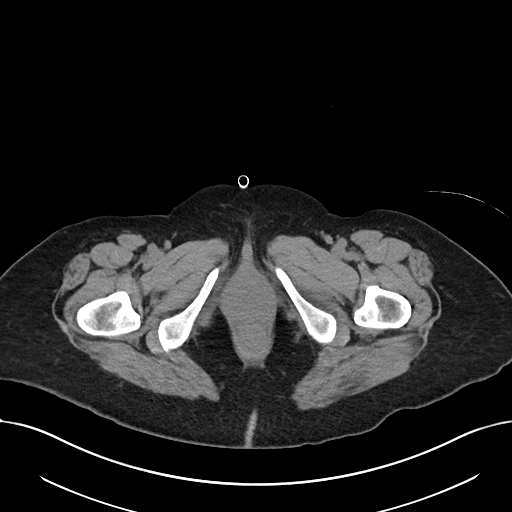
[im 19/84  soft-tissue]
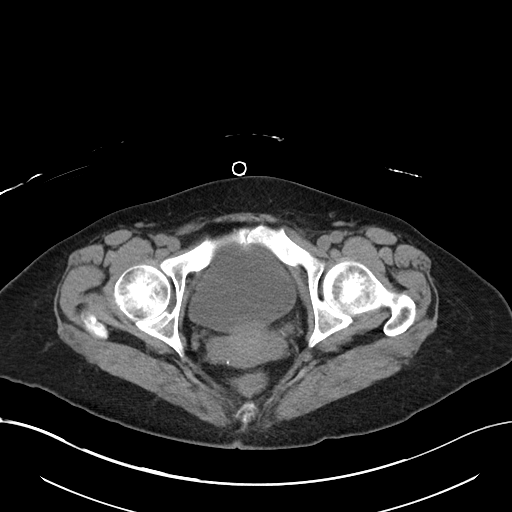
[im 24/84  soft-tissue]
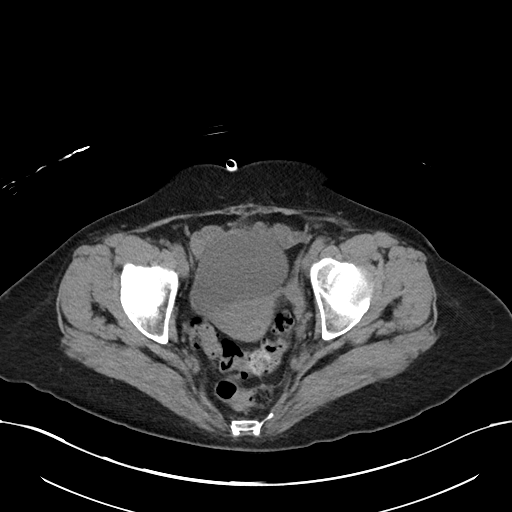
[im 28/84  soft-tissue]
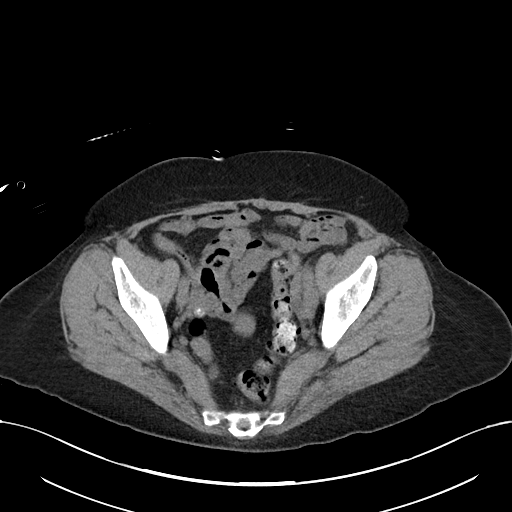
[im 37/84  soft-tissue]
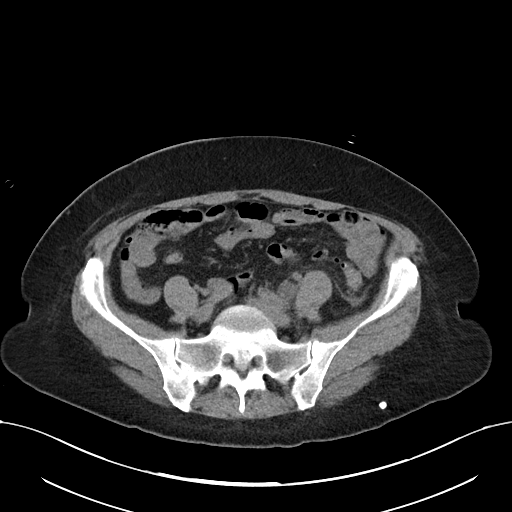
[im 42/84  soft-tissue]
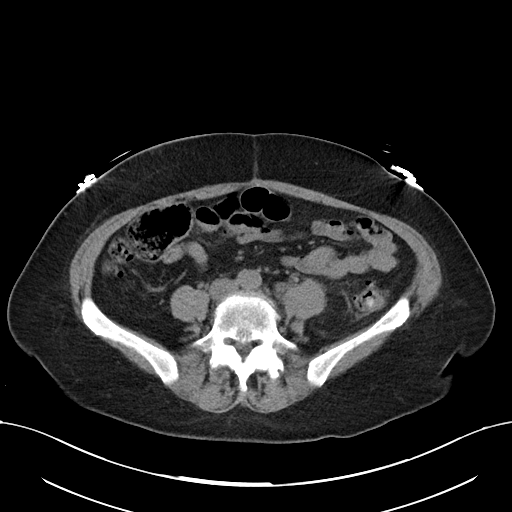
[im 47/84  soft-tissue]
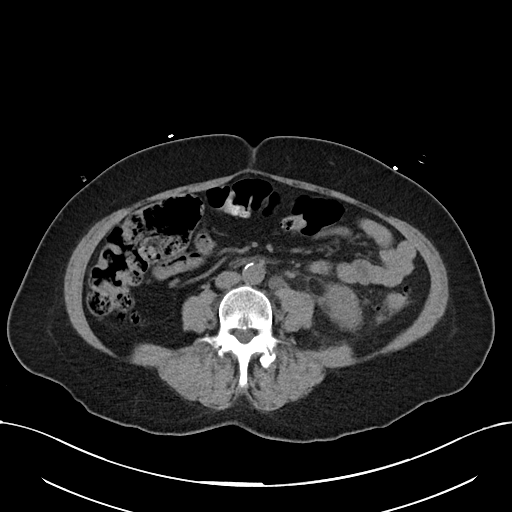
[im 56/84  soft-tissue]
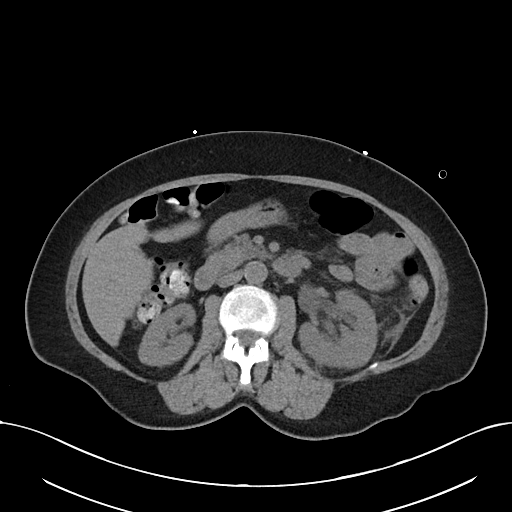
[im 56/84  bone]
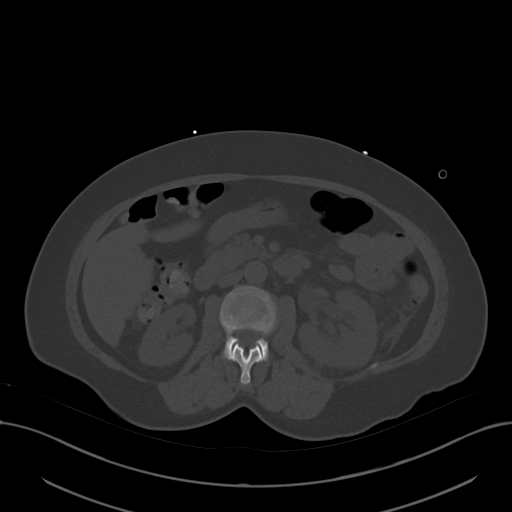
[im 60/84  soft-tissue]
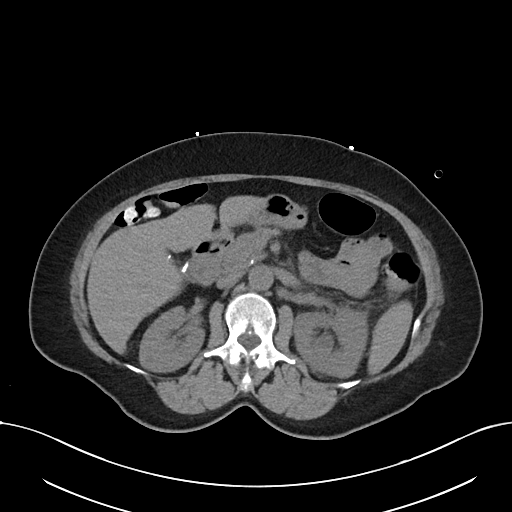
[im 65/84  soft-tissue]
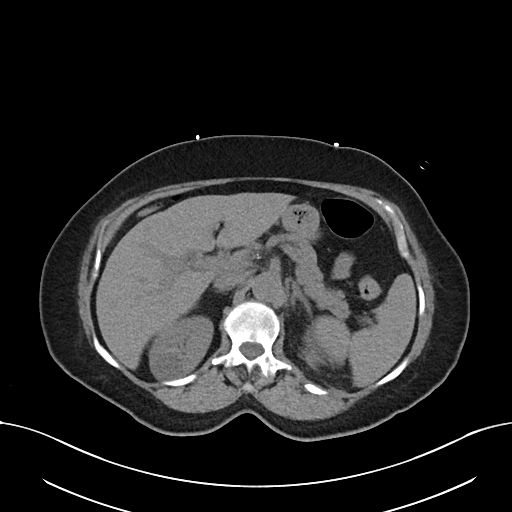
[im 74/84  soft-tissue]
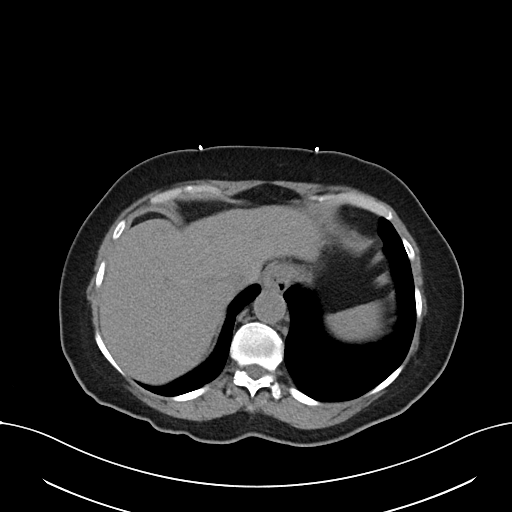
[im 79/84  soft-tissue]
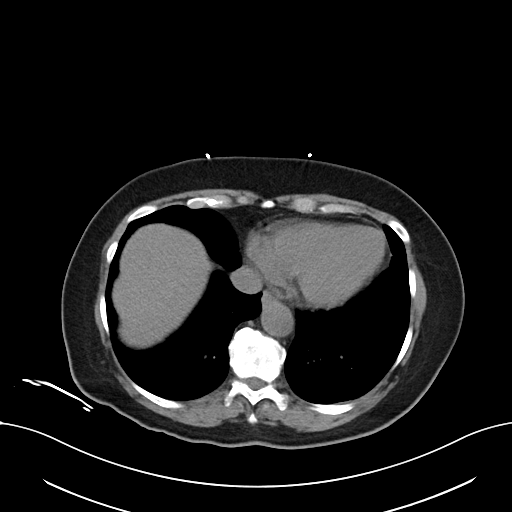

[Series 6: cor · coronal · 0.78mm/px · 3 of 88 slices shown]
[im 30/88  soft-tissue]
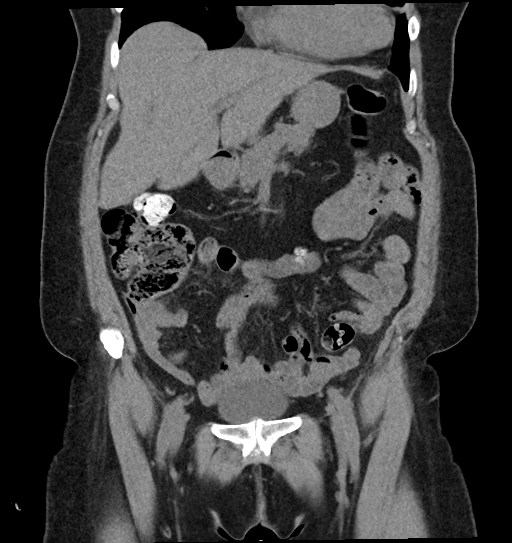
[im 39/88  soft-tissue]
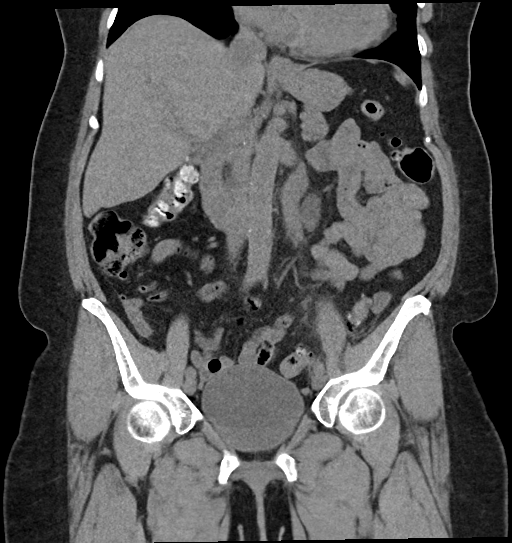
[im 49/88  soft-tissue]
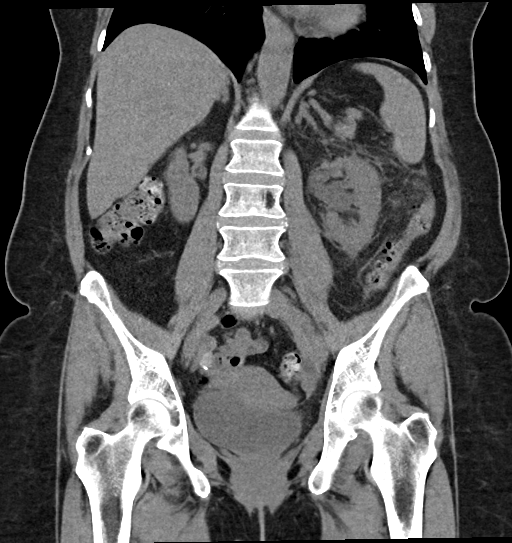

[16 of 46 positions shown; findings below may reference images not displayed]

FINDINGS: Lower chest: There is atelectasis versus scarring in the lingula.The
heart size is normal.

Hepatobiliary: The liver is normal. Status post cholecystectomy.The
previously demonstrated biliary ductal dilatation has significantly
improved since the prior study.

Pancreas: Normal contours without ductal dilatation. No
peripancreatic fluid collection.

Spleen: A few splenic hypoattenuating lesions are identified, not
well characterized on this exam.

Adrenals/Urinary Tract:

--Adrenal glands: No adrenal hemorrhage.

--Right kidney/ureter: No hydronephrosis or perinephric hematoma.

--Left kidney/ureter: There is moderate left-sided hydronephrosis
without evidence for a radiopaque obstructing kidney stones. This is
new since prior study. Of note, no radiopaque stone was visualized
on the recent prior CT of the abdomen.

--Urinary bladder: Unremarkable.

Stomach/Bowel:

--Stomach/Duodenum: No hiatal hernia or other gastric abnormality.
Normal duodenal course and caliber.

--Small bowel: No dilatation or inflammation.

--Colon: Rectosigmoid diverticulosis without acute inflammation.

--Appendix: Normal.

Vascular/Lymphatic: Atherosclerotic calcification is present within
the non-aneurysmal abdominal aorta, without hemodynamically
significant stenosis.

--No retroperitoneal lymphadenopathy.

--No mesenteric lymphadenopathy.

--No pelvic or inguinal lymphadenopathy.

Reproductive: Unremarkable

Other: No ascites or free air. The abdominal wall is normal.

Musculoskeletal. No acute displaced fractures.
IMPRESSION: 1. There is moderate left-sided hydronephrosis without evidence for
a radiopaque obstructing kidney stone. Of note, no radiopaque stone
was visualized on the recent CT dated September 20, 2019.
2. Status post cholecystectomy. Resolution of previously
demonstrated biliary ductal dilatation.

Aortic Atherosclerosis ([id number]).

## 2020-08-18 DIAGNOSIS — R4689 Other symptoms and signs involving appearance and behavior: Secondary | ICD-10-CM | POA: Diagnosis not present

## 2020-08-18 DIAGNOSIS — R413 Other amnesia: Secondary | ICD-10-CM | POA: Diagnosis not present

## 2020-09-12 DIAGNOSIS — R21 Rash and other nonspecific skin eruption: Secondary | ICD-10-CM | POA: Diagnosis not present

## 2020-09-12 DIAGNOSIS — M2550 Pain in unspecified joint: Secondary | ICD-10-CM | POA: Diagnosis not present

## 2020-09-12 DIAGNOSIS — M542 Cervicalgia: Secondary | ICD-10-CM | POA: Diagnosis not present

## 2020-09-12 DIAGNOSIS — Z79899 Other long term (current) drug therapy: Secondary | ICD-10-CM | POA: Diagnosis not present

## 2020-09-12 DIAGNOSIS — Z6831 Body mass index (BMI) 31.0-31.9, adult: Secondary | ICD-10-CM | POA: Diagnosis not present

## 2020-09-12 DIAGNOSIS — M150 Primary generalized (osteo)arthritis: Secondary | ICD-10-CM | POA: Diagnosis not present

## 2020-09-12 DIAGNOSIS — E669 Obesity, unspecified: Secondary | ICD-10-CM | POA: Diagnosis not present

## 2020-11-30 DIAGNOSIS — Z01812 Encounter for preprocedural laboratory examination: Secondary | ICD-10-CM | POA: Diagnosis not present

## 2020-12-05 DIAGNOSIS — Z86010 Personal history of colonic polyps: Secondary | ICD-10-CM | POA: Diagnosis not present

## 2020-12-05 DIAGNOSIS — K5730 Diverticulosis of large intestine without perforation or abscess without bleeding: Secondary | ICD-10-CM | POA: Diagnosis not present

## 2021-01-23 DIAGNOSIS — F22 Delusional disorders: Secondary | ICD-10-CM | POA: Diagnosis not present

## 2021-01-23 DIAGNOSIS — R4689 Other symptoms and signs involving appearance and behavior: Secondary | ICD-10-CM | POA: Diagnosis not present

## 2021-01-29 DIAGNOSIS — E538 Deficiency of other specified B group vitamins: Secondary | ICD-10-CM | POA: Diagnosis not present

## 2021-02-05 DIAGNOSIS — E538 Deficiency of other specified B group vitamins: Secondary | ICD-10-CM | POA: Diagnosis not present

## 2021-02-12 DIAGNOSIS — E538 Deficiency of other specified B group vitamins: Secondary | ICD-10-CM | POA: Diagnosis not present

## 2021-02-19 DIAGNOSIS — E538 Deficiency of other specified B group vitamins: Secondary | ICD-10-CM | POA: Diagnosis not present

## 2021-03-06 DIAGNOSIS — Z79899 Other long term (current) drug therapy: Secondary | ICD-10-CM | POA: Diagnosis not present

## 2021-03-06 DIAGNOSIS — M545 Low back pain, unspecified: Secondary | ICD-10-CM | POA: Diagnosis not present

## 2021-03-06 DIAGNOSIS — Z1389 Encounter for screening for other disorder: Secondary | ICD-10-CM | POA: Diagnosis not present

## 2021-03-06 DIAGNOSIS — Z1231 Encounter for screening mammogram for malignant neoplasm of breast: Secondary | ICD-10-CM | POA: Diagnosis not present

## 2021-03-06 DIAGNOSIS — M8580 Other specified disorders of bone density and structure, unspecified site: Secondary | ICD-10-CM | POA: Diagnosis not present

## 2021-03-06 DIAGNOSIS — E7800 Pure hypercholesterolemia, unspecified: Secondary | ICD-10-CM | POA: Diagnosis not present

## 2021-03-06 DIAGNOSIS — M25559 Pain in unspecified hip: Secondary | ICD-10-CM | POA: Diagnosis not present

## 2021-03-06 DIAGNOSIS — F320 Major depressive disorder, single episode, mild: Secondary | ICD-10-CM | POA: Diagnosis not present

## 2021-03-06 DIAGNOSIS — E663 Overweight: Secondary | ICD-10-CM | POA: Diagnosis not present

## 2021-03-06 DIAGNOSIS — E538 Deficiency of other specified B group vitamins: Secondary | ICD-10-CM | POA: Diagnosis not present

## 2021-03-06 DIAGNOSIS — Z0000 Encounter for general adult medical examination without abnormal findings: Secondary | ICD-10-CM | POA: Diagnosis not present

## 2021-03-06 DIAGNOSIS — Z5181 Encounter for therapeutic drug level monitoring: Secondary | ICD-10-CM | POA: Diagnosis not present

## 2021-03-14 DIAGNOSIS — Z79899 Other long term (current) drug therapy: Secondary | ICD-10-CM | POA: Diagnosis not present

## 2021-03-14 DIAGNOSIS — M545 Low back pain, unspecified: Secondary | ICD-10-CM | POA: Diagnosis not present

## 2021-03-14 DIAGNOSIS — Z6830 Body mass index (BMI) 30.0-30.9, adult: Secondary | ICD-10-CM | POA: Diagnosis not present

## 2021-03-14 DIAGNOSIS — M2550 Pain in unspecified joint: Secondary | ICD-10-CM | POA: Diagnosis not present

## 2021-03-14 DIAGNOSIS — M150 Primary generalized (osteo)arthritis: Secondary | ICD-10-CM | POA: Diagnosis not present

## 2021-03-14 DIAGNOSIS — E669 Obesity, unspecified: Secondary | ICD-10-CM | POA: Diagnosis not present

## 2021-03-22 DIAGNOSIS — E538 Deficiency of other specified B group vitamins: Secondary | ICD-10-CM | POA: Diagnosis not present

## 2021-04-03 DIAGNOSIS — F320 Major depressive disorder, single episode, mild: Secondary | ICD-10-CM | POA: Diagnosis not present

## 2021-04-17 DIAGNOSIS — E538 Deficiency of other specified B group vitamins: Secondary | ICD-10-CM | POA: Diagnosis not present

## 2021-05-15 DIAGNOSIS — E538 Deficiency of other specified B group vitamins: Secondary | ICD-10-CM | POA: Diagnosis not present

## 2021-06-15 DIAGNOSIS — E538 Deficiency of other specified B group vitamins: Secondary | ICD-10-CM | POA: Diagnosis not present

## 2021-09-11 DIAGNOSIS — R413 Other amnesia: Secondary | ICD-10-CM | POA: Diagnosis not present

## 2021-09-11 DIAGNOSIS — F320 Major depressive disorder, single episode, mild: Secondary | ICD-10-CM | POA: Diagnosis not present

## 2021-09-19 DIAGNOSIS — E538 Deficiency of other specified B group vitamins: Secondary | ICD-10-CM | POA: Diagnosis not present

## 2021-10-18 ENCOUNTER — Other Ambulatory Visit: Payer: Self-pay | Admitting: *Deleted

## 2021-10-18 ENCOUNTER — Encounter: Payer: Self-pay | Admitting: *Deleted

## 2021-10-22 ENCOUNTER — Encounter: Payer: Self-pay | Admitting: Psychiatry

## 2021-10-22 ENCOUNTER — Ambulatory Visit (INDEPENDENT_AMBULATORY_CARE_PROVIDER_SITE_OTHER): Payer: Medicare HMO | Admitting: Psychiatry

## 2021-10-22 VITALS — BP 144/77 | HR 57 | Ht 60.0 in | Wt 156.0 lb

## 2021-10-22 DIAGNOSIS — F22 Delusional disorders: Secondary | ICD-10-CM | POA: Diagnosis not present

## 2021-10-22 DIAGNOSIS — R413 Other amnesia: Secondary | ICD-10-CM

## 2021-10-22 DIAGNOSIS — E538 Deficiency of other specified B group vitamins: Secondary | ICD-10-CM | POA: Diagnosis not present

## 2021-10-22 DIAGNOSIS — R69 Illness, unspecified: Secondary | ICD-10-CM | POA: Diagnosis not present

## 2021-10-22 NOTE — Patient Instructions (Addendum)
MRI brain Blood work - thyroid and B12 Referral for neuropsychological assessment
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
## 2021-10-23 ENCOUNTER — Telehealth: Payer: Self-pay

## 2021-10-23 LAB — TSH: TSH: 3.03 u[IU]/mL (ref 0.450–4.500)

## 2021-10-23 LAB — VITAMIN B12: Vitamin B-12: 572 pg/mL (ref 232–1245)

## 2021-10-23 NOTE — Telephone Encounter (Signed)
-----   Message from Ocie Doyne, MD sent at 10/23/2021  8:33 AM EST ----- Blood work is normal

## 2021-10-23 NOTE — Telephone Encounter (Signed)
I called the pt and left a vm (ok per dpr) advising of results.  Pt was advised to cb if she had question. Message was left on husbands vm ( ok per dpr as well)

## 2021-10-24 ENCOUNTER — Telehealth: Payer: Self-pay | Admitting: Psychiatry

## 2021-10-24 ENCOUNTER — Encounter: Payer: Self-pay | Admitting: Psychology

## 2021-10-24 NOTE — Telephone Encounter (Signed)
[employer] medicare Berkley Harvey: [id number] (exp. 10/24/21 to 04/22/22) & [place] Numbers: [id number] (exp. 10/24/21 to 11/22/21) order sent to [facility], they will reach out to the patient to schedule.

## 2021-11-06 ENCOUNTER — Ambulatory Visit
Admission: RE | Admit: 2021-11-06 | Discharge: 2021-11-06 | Disposition: A | Discharge status: Home / Self Care | Payer: Medicare HMO | Source: Ambulatory Visit | Attending: Psychiatry | Admitting: Psychiatry

## 2021-11-06 ENCOUNTER — Other Ambulatory Visit: Payer: Self-pay

## 2021-11-06 DIAGNOSIS — R413 Other amnesia: Secondary | ICD-10-CM | POA: Diagnosis not present

## 2021-11-06 MED ORDER — GADOBENATE DIMEGLUMINE 529 MG/ML IV SOLN
14.0000 mL | Freq: Once | INTRAVENOUS | Status: AC | PRN
  Administered 2021-11-06: 14 mL via INTRAVENOUS

## 2021-11-07 ENCOUNTER — Telehealth: Payer: Self-pay

## 2021-11-07 NOTE — Telephone Encounter (Signed)
-----   Message from Genia Harold, MD sent at 11/07/2021 12:41 PM EST ----- MRI shows mild-moderate shrinkage of the brain in the area of the brain responsible for memory. This can contribute to memory issues and can be seen in patients with dementia. Her neuropsychological testing in March will help [place] determine if she is showing signs of early dementia

## 2021-11-07 NOTE — Telephone Encounter (Signed)
Contacted spouse per DPR, Lvm rq call back

## 2021-11-07 NOTE — Telephone Encounter (Signed)
Spouse returned call, informed him MRI shows mild-moderate shrinkage of the brain in the area of the brain responsible for memory. This can contribute to memory issues and can be seen in patients with dementia. Her neuropsychological testing in March will help [place] determine if she is showing signs of early dementia. He asked when that appointment was again, date provided 01/09/22.  Advised to call office back with questions as he had no more at the time and was appreciative.

## 2021-11-27 DIAGNOSIS — E538 Deficiency of other specified B group vitamins: Secondary | ICD-10-CM | POA: Diagnosis not present

## 2021-11-29 DIAGNOSIS — M2550 Pain in unspecified joint: Secondary | ICD-10-CM | POA: Diagnosis not present

## 2021-11-29 DIAGNOSIS — Z6830 Body mass index (BMI) 30.0-30.9, adult: Secondary | ICD-10-CM | POA: Diagnosis not present

## 2021-11-29 DIAGNOSIS — M545 Low back pain, unspecified: Secondary | ICD-10-CM | POA: Diagnosis not present

## 2021-11-29 DIAGNOSIS — M5136 Other intervertebral disc degeneration, lumbar region: Secondary | ICD-10-CM | POA: Diagnosis not present

## 2021-11-29 DIAGNOSIS — Z79899 Other long term (current) drug therapy: Secondary | ICD-10-CM | POA: Diagnosis not present

## 2021-11-29 DIAGNOSIS — M150 Primary generalized (osteo)arthritis: Secondary | ICD-10-CM | POA: Diagnosis not present

## 2021-11-29 DIAGNOSIS — E669 Obesity, unspecified: Secondary | ICD-10-CM | POA: Diagnosis not present

## 2022-01-08 ENCOUNTER — Encounter: Payer: Self-pay | Admitting: Psychology

## 2022-01-08 DIAGNOSIS — F329 Major depressive disorder, single episode, unspecified: Secondary | ICD-10-CM | POA: Insufficient documentation

## 2022-01-08 DIAGNOSIS — G9332 Myalgic encephalomyelitis/chronic fatigue syndrome: Secondary | ICD-10-CM | POA: Insufficient documentation

## 2022-01-08 DIAGNOSIS — E2839 Other primary ovarian failure: Secondary | ICD-10-CM | POA: Insufficient documentation

## 2022-01-08 DIAGNOSIS — E7800 Pure hypercholesterolemia, unspecified: Secondary | ICD-10-CM | POA: Insufficient documentation

## 2022-01-08 NOTE — Progress Notes (Signed)
? ?NEUROPSYCHOLOGICAL EVALUATION ?[place] ?[place] Department of Neurology ? ?Dates of Evaluation: January 09, 2022 and January 14, 2022 ? ?Reason for Referral:  ? ?Niah Heinle is a 71 y.o. right-handed Caucasian female referred by  Ocie Doyne, M.D. , to characterize her current cognitive functioning and assist with diagnostic clarity and treatment planning in the context of subjective cognitive decline and concerns for emerging paranoia/delusional thinking.  ? ?Assessment and Plan:  ? ?Clinical Impression(s): ?Ms. Willhite's pattern of performance is suggestive of primary impairments surrounding semantic fluency, as well as both encoding (i.e., learning) and retrieval aspects of memory. Executive functioning was variable but largely below expectation. Performance variability was also seen across recognition/consolidation aspects of memory. Performances were appropriate across processing speed, attention/concentration, receptive language, sentence repetition, phonemic fluency, confrontation naming, and visuospatial abilities relative to age-matched peers. Given higher estimations of premorbid intellectual functioning, it is possible that scores in the below average to average normative ranges represent a decline from a previously higher level of functioning. However, there is no prior testing to confirm this. Regarding instrumental activities of daily living (ADLs), Ms. Matherly benefits from having few medications to manage. While she continues to drive, she has gotten lost on several occasions. 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 However, she is at risk for transitioning to a dementia designation in the future, especially if there is an underlying neurological component to her presentation (see below). ? ?The etiology for her current clinical presentation is unclear and difficult to determine. From a  neurological perspective, greater discussion appears warranted around the potential presence of frontotemporal lobar degeneration which would lead to a frontotemporal dementia (FTD) presentation. Perisylvian atrophy as seen on her past brain MRIs is commonly seen in FTD presentations, particularly surrounding primary progressive aphasia (PPA) presentations. While ongoing delusional thinking is fairly uncommon in FTD, it is not unheard of with some research estimating 10-20% of individuals with this illness can experience these symptoms. Delusions are often experienced early on and, as FTD can yield fairly benign patterns of weakness across testing in earlier stages, can often be misdiagnosed as a primary psychiatric symptom. Current testing, especially deficits in executive functioning and semantic fluency, would fall in line with expected patterns of performance with FTD more broadly. It is worth highlighting that Ms. Pitter does not display severe personality changes as most commonly seen within the behavioral variant of FTD and she does not display outward language disruption as commonly seen in PPA presentations outside of tangential responding. However, this illness should remain on her differential and does seem plausible at the current time.  ? ?Despite difficulties learning and later recalling previously learned information upon demand, patterns across testing are not fully consistent with Alzheimer's disease at the present time. However, I admittedly cannot rule out an early presentation of this illness and delusional thinking is more common in Alzheimer's disease than FTD. This too remains a possibility. Cognitive and behavioral patterns are not consistent with Lewy body dementia or another more rare parkinsonian presentation. Recent imaging did not reveal age-advanced cerebrovascular changes or prior infarct, making a vascular dementia presentation very unlikely.  ? ?Outside of a underlying  neurodegenerative cause, there also remains the potential for delusional thinking to represent an emerging psychiatric condition such as a delusional disorder. With this condition, the primary symptom involves the presence of one or more persistent delusions. Non-bizarre delusions involve situations that could possibly occur in real  life, such as Ms. Hellen's belief that individuals are entering her home and stealing from her or eating her food when she is not there. This experience would fall within the persecutory delusion subtype, which is most common. Consistent with what her husband described, individuals with a delusional disorder are at higher risk for isolative behaviors (e.g., not leaving the house) and increased psychiatric distress (e.g., anxiety and depression). It is worth highlighting that a delusional disorder is a rare presentation. Ms. Flaming presentation would be even more rare given that the average age of onset of this condition is around 69 years old. Outside of difficulties arising directly from delusional thinking, these individuals can often otherwise function well in their day-to-day lives. ? ?Recommendations: ?A repeat neuropsychological evaluation in 18 months (or sooner if functional decline is noted) is recommended to assess the trajectory of future cognitive decline should it occur. This will also aid in future efforts towards improved diagnostic clarity. ? ?I would recommend that Ms. Renda be referred for an FDG-PET scan as this scan can better understand metabolic patterns that could provide greater diagnostic clarity surrounding the presence of a neurodegenerative ilness versus a primary psychiatric condition. This referral would need to come from her neurologist.  ? ?Should a delusional disorder be favored over a neurodegenerative cause, treatment for this condition involves a combination of medication management and individual psychotherapy. Delusional disorder has proved to be  resistant to treatment with medication alone. She would benefit from an active and collaborative therapeutic environment, rather than one purely supportive in nature. The recommended treatment modality for a delusional disorder is Cognitive Behavioral Therapy (CBT). Family-focused therapy can sometimes be employed to help with communication between the patient and other family members, as well as various problem solving skills. Medication intervention typically involves first and second generation antipsychotics, as well as various anti-depressants.  ? ?Should there be a progression current deficits over time, she is unlikely to regain any independent living skills lost. Therefore, it is recommended that she remain as involved as possible in all aspects of household chores, finances, and medication management, with supervision to ensure adequate performance. She will likely benefit from the establishment and maintenance of a routine in order to maximize her functional abilities over time. ? ?It will be important for Ms. Gossett to have another person with her when in situations where she may need to process information, weigh the pros and cons of different options, and make decisions, in order to ensure that she fully understands and recalls all information to be considered. ? ?Ms. Podgurski is encouraged to attend to lifestyle factors for brain health (e.g., regular physical exercise, good nutrition habits, regular participation in cognitively-stimulating activities, and general stress management techniques), which are likely to have benefits for both emotional adjustment and cognition. In fact, in addition to promoting good general health, regular exercise incorporating aerobic activities (e.g., brisk walking, jogging, cycling, etc.) has been demonstrated to be a very effective treatment for depression and stress, with similar efficacy rates to both antidepressant medication and psychotherapy. Optimal control of  vascular risk factors (including safe cardiovascular exercise and adherence to dietary recommendations) is encouraged. Continued participation in activities which provide mental stimulation and social interaction is also recommend

## 2022-01-09 ENCOUNTER — Ambulatory Visit: Payer: Medicare HMO

## 2022-01-09 ENCOUNTER — Ambulatory Visit (INDEPENDENT_AMBULATORY_CARE_PROVIDER_SITE_OTHER): Payer: Medicare HMO | Admitting: Psychology

## 2022-01-09 ENCOUNTER — Encounter: Payer: Self-pay | Admitting: Psychology

## 2022-01-09 ENCOUNTER — Other Ambulatory Visit: Payer: Self-pay

## 2022-01-09 DIAGNOSIS — E785 Hyperlipidemia, unspecified: Secondary | ICD-10-CM | POA: Insufficient documentation

## 2022-01-09 DIAGNOSIS — M5136 Other intervertebral disc degeneration, lumbar region: Secondary | ICD-10-CM | POA: Insufficient documentation

## 2022-01-09 DIAGNOSIS — F22 Delusional disorders: Secondary | ICD-10-CM | POA: Diagnosis not present

## 2022-01-09 DIAGNOSIS — R4189 Other symptoms and signs involving cognitive functions and awareness: Secondary | ICD-10-CM | POA: Diagnosis not present

## 2022-01-09 DIAGNOSIS — Z6832 Body mass index (BMI) 32.0-32.9, adult: Secondary | ICD-10-CM | POA: Insufficient documentation

## 2022-01-09 DIAGNOSIS — G3184 Mild cognitive impairment, so stated: Secondary | ICD-10-CM

## 2022-01-09 DIAGNOSIS — R21 Rash and other nonspecific skin eruption: Secondary | ICD-10-CM | POA: Insufficient documentation

## 2022-01-09 DIAGNOSIS — M2550 Pain in unspecified joint: Secondary | ICD-10-CM | POA: Insufficient documentation

## 2022-01-09 DIAGNOSIS — M1991 Primary osteoarthritis, unspecified site: Secondary | ICD-10-CM | POA: Insufficient documentation

## 2022-01-09 DIAGNOSIS — M542 Cervicalgia: Secondary | ICD-10-CM | POA: Insufficient documentation

## 2022-01-09 NOTE — Progress Notes (Signed)
? ?  Psychometrician Note ?  ?Cognitive testing was administered to [name] Young by Shan Levans, B.S. (psychometrist) under the supervision of Dr. Newman Nickels, Ph.D., licensed psychologist on 01/09/2022. Ms. Sarvis did not appear overtly distressed by the testing session per behavioral observation or responses across self-report questionnaires. Rest breaks were offered.  ?  ?The battery of tests administered was selected by Dr. Newman Nickels, Ph.D. with consideration to Ms. Schoenberg's current level of functioning, the nature of her symptoms, emotional and behavioral responses during interview, level of literacy, observed level of motivation/effort, and the nature of the referral question. This battery was communicated to the psychometrist. Communication between Dr. Newman Nickels, Ph.D. and the psychometrist was ongoing throughout the evaluation and Dr. Newman Nickels, Ph.D. was immediately accessible at all times. Dr. Newman Nickels, Ph.D. provided supervision to the psychometrist on the date of this service to the extent necessary to assure the quality of all services provided.  ?  ?[name] Young will return within approximately 1-2 weeks for an interactive feedback session with Dr. Milbert Coulter at which time her test performances, clinical impressions, and treatment recommendations will be reviewed in detail. Ms. Kang understands she can contact our office should she require our assistance before this time. ? ?A total of 100 minutes of billable time were spent face-to-face with [name] Young by the psychometrist. This includes both test administration and scoring time. Billing for these services is reflected in the clinical report generated by Dr. Newman Nickels, Ph.D. ? ?This note reflects time spent with the psychometrician and does not include test scores or any clinical interpretations made by Dr. Milbert Coulter. The full report will follow in a separate note.  ?

## 2022-01-14 ENCOUNTER — Other Ambulatory Visit: Payer: Self-pay | Admitting: Psychiatry

## 2022-01-14 ENCOUNTER — Ambulatory Visit: Payer: Medicare HMO

## 2022-01-14 ENCOUNTER — Encounter: Payer: Self-pay | Admitting: Psychology

## 2022-01-14 DIAGNOSIS — G3184 Mild cognitive impairment, so stated: Secondary | ICD-10-CM

## 2022-01-14 DIAGNOSIS — F22 Delusional disorders: Secondary | ICD-10-CM

## 2022-01-14 DIAGNOSIS — R4189 Other symptoms and signs involving cognitive functions and awareness: Secondary | ICD-10-CM

## 2022-01-14 HISTORY — DX: Mild cognitive impairment of uncertain or unknown etiology: G31.84

## 2022-01-14 HISTORY — DX: Delusional disorders: F22

## 2022-01-14 NOTE — Progress Notes (Signed)
? ?  Psychometrician Note ?  ?Cognitive testing was administered to [name] Young by Shan Levans, B.S. (psychometrist) under the supervision of Dr. Newman Nickels, Ph.D., licensed psychologist on 01/14/2022. [name] Young did not appear overtly distressed by the testing session per behavioral observation or responses across self-report questionnaires. Rest breaks were offered.  ?  ?The battery of tests administered was selected by Dr. Newman Nickels, Ph.D. with consideration to [name] Young's current level of functioning, the nature of her symptoms, emotional and behavioral responses during interview, level of literacy, observed level of motivation/effort, and the nature of the referral question. This battery was communicated to the psychometrist. Communication between Dr. Newman Nickels, Ph.D. and the psychometrist was ongoing throughout the evaluation and Dr. Newman Nickels, Ph.D. was immediately accessible at all times. Dr. Newman Nickels, Ph.D. provided supervision to the psychometrist on the date of this service to the extent necessary to assure the quality of all services provided.  ?  ?[name] Young will return within approximately 1-2 weeks for an interactive feedback session with Dr. Milbert Coulter at which time her test performances, clinical impressions, and treatment recommendations will be reviewed in detail. [name] Young understands she can contact our office should she require our assistance before this time. ? ?A total of 100 minutes of billable time were spent face-to-face with [name] Young by the psychometrist. This includes both test administration and scoring time. Billing for these services is reflected in the clinical report generated by Dr. Newman Nickels, Ph.D. ? ?This note reflects time spent with the psychometrician and does not include test scores or any clinical interpretations made by Dr. Milbert Coulter. The full report will follow in a separate note.  ?

## 2022-01-15 ENCOUNTER — Other Ambulatory Visit: Payer: Self-pay | Admitting: Psychiatry

## 2022-01-15 ENCOUNTER — Telehealth: Payer: Self-pay

## 2022-01-15 DIAGNOSIS — G309 Alzheimer's disease, unspecified: Secondary | ICD-10-CM

## 2022-01-15 NOTE — Telephone Encounter (Signed)
-----   Message from Ocie Doyne, MD sent at 01/15/2022 12:08 PM EDT ----- ?Regarding: Neuropsych results ?I reviewed her neuropsychology results. As of now she does not meet criteria for a diagnosis of dementia. However there is some concern from her neuropsychologist that she may be at risk of developing dementia in the future. A PET scan of the brain will help [place] determine if she has early signs of dementia based on her brain metabolism. It can also help [place] determine which type of dementia she may be at risk of developing. I've placed the order for the PET scan for her. ? ?

## 2022-01-15 NOTE — Telephone Encounter (Addendum)
Contacted pt, spoke to spouse per DPR, to go over results and he stated he has an appt with neuro tomorrow and would rather go over them then.  ?

## 2022-01-16 ENCOUNTER — Ambulatory Visit (INDEPENDENT_AMBULATORY_CARE_PROVIDER_SITE_OTHER): Payer: Medicare HMO | Admitting: Psychology

## 2022-01-16 DIAGNOSIS — G3184 Mild cognitive impairment, so stated: Secondary | ICD-10-CM | POA: Diagnosis not present

## 2022-01-16 DIAGNOSIS — F22 Delusional disorders: Secondary | ICD-10-CM

## 2022-01-16 NOTE — Progress Notes (Signed)
? ?  Neuropsychology Feedback Session ?[facility] ?[place] Department of Neurology ? ?Reason for Referral:  ? ?[name] Young is a 71 y.o. right-handed Caucasian female referred by  Ocie Doyne, M.D. , to characterize her current cognitive functioning and assist with diagnostic clarity and treatment planning in the context of subjective cognitive decline and concerns for emerging paranoia/delusional thinking.  ? ?Feedback:  ? ?[name] Young completed a comprehensive neuropsychological evaluation on 01/09/2022 and 01/14/2022. Please refer to that encounter for the full report and recommendations. Briefly, results suggested primary impairments surrounding semantic fluency, as well as both encoding (i.e., learning) and retrieval aspects of memory. Executive functioning was variable but largely below expectation. Performance variability was also seen across recognition/consolidation aspects of memory. From a neurological perspective, greater discussion appears warranted around the potential presence of frontotemporal lobar degeneration which would lead to a frontotemporal dementia (FTD) presentation. Perisylvian atrophy as seen on her past brain MRIs is commonly seen in FTD presentations, particularly surrounding primary progressive aphasia (PPA) presentations. While ongoing delusional thinking is fairly uncommon in FTD, it is not unheard of with some research estimating 10-20% of individuals with this illness can experience these symptoms. Delusions are often experienced early on and, as FTD can yield fairly benign patterns of weakness across testing in earlier stages, can often be misdiagnosed as a primary psychiatric symptom. Current testing, especially deficits in executive functioning and semantic fluency, would fall in line with expected patterns of performance with FTD more broadly. Outside of a underlying neurodegenerative cause, there also remains the potential for delusional thinking to represent  an emerging psychiatric condition such as a delusional disorder. With this condition, the primary symptom involves the presence of one or more persistent delusions. Non-bizarre delusions involve situations that could possibly occur in real life, such as [name] Young's belief that individuals are entering her home and stealing from her or eating her food when she is not there. This experience would fall within the persecutory delusion subtype, which is most common. Consistent with what her husband described, individuals with a delusional disorder are at higher risk for isolative behaviors (e.g., not leaving the house) and increased psychiatric distress (e.g., anxiety and depression).  ? ?[name] Young was accompanied by her husband during the current feedback session. Content of the current session focused on the results of her neuropsychological evaluation. [name] Young was given the opportunity to ask questions and her questions were answered. She was encouraged to reach out should additional questions arise. A copy of her report was provided at the conclusion of the visit.  ? ?  ? ?30 minutes were spent conducting the current feedback session with [name] Young, billed as one unit [phone].  ?

## 2022-01-21 NOTE — Progress Notes (Signed)
? ?  CC:  memory loss ? ?Follow-up Visit ? ?Last visit: 10/22/21 ? ?Brief HPI: ?71 year old female with a history of arthritis, B12 deficiency who follows in clinic for memory loss and delusions. ? ?At her last visit she was referred for neuropsychological testing. MRI brain was ordered. ? ?Interval History: ?Her husband feels her short term memory is getting worse. She is forgetting things she has done the night before. She continues to drive, though her husband and son will check on her when she is alone to make sure she has not gotten lost. Has not had any accidents or gotten lost. ? ?MRI brain showed mild-moderate perisylvian atrophy. Neuropsychologic testing was most consistent with mild cognitive impairment, however there was concern for possible future transition into dementia. Could not rule out early Alzheimer's, FTD, or primary psychiatric condition based on testing results. ? ?Physical Exam:  ? ?Vital Signs: ?BP 138/67   Pulse 61   Ht 5' (1.524 m)   Wt 160 lb (72.6 kg)   BMI 31.25 kg/m?  ?GENERAL:  well appearing, in no acute distress, alert  ?SKIN:  Color, texture, turgor normal. No rashes or lesions ?HEAD:  Normocephalic/atraumatic. ?RESP: normal respiratory effort ?MSK:  No gross joint deformities.  ? ?NEUROLOGICAL: ?Mental Status: Tangential thought process requiring frequent redirection ?Cranial Nerves: PERRL, face symmetric, no dysarthria, hearing grossly intact ?Motor: moves all extremities equally, no tremor, fine finger movements intact ?Gait: normal stride length, stooped posture ? ?IMPRESSION: ?71 year old female with a history of arthritis, B12 deficiency who presents for follow up of memory loss. Neuropsychological testing was most consistent with mild cognitive impairment, but could not rule out early FTD vs Alzheimers disease vs a primary psychiatric condition. Will order FDG-PET to help better differentiate between the possible neurodegenerative processes. Discussed that there is not good  evidence for efficacy of donepezil for MCI or FTD, though it can be helpful in early Alzheimer's disease. Patient and husband would like to try the medication to see if it could help. They would prefer not to wait for the PET scan to try a medication. Will start donepezil for memory. Discussed safety measures including driving. Counseled patient to avoid driving alone and to avoid long distances. ? ?PLAN: ?-FDG-PET scan ?-Start donepezil 5 mg daily x4 weeks, then increase to 10 mg daily ? ?Follow-up: 6 months ? ?I spent a total of 37 minutes on the date of the service. Discussed medication side effects, adverse reactions and drug interactions. Written educational materials and patient instructions outlining all of the above were given. ? ?Genia Harold, MD ?01/22/22 ?2:45 PM ? ?

## 2022-01-22 ENCOUNTER — Telehealth: Payer: Self-pay | Admitting: Psychiatry

## 2022-01-22 ENCOUNTER — Encounter: Payer: Self-pay | Admitting: Psychiatry

## 2022-01-22 ENCOUNTER — Ambulatory Visit (INDEPENDENT_AMBULATORY_CARE_PROVIDER_SITE_OTHER): Payer: Medicare HMO | Admitting: Psychiatry

## 2022-01-22 VITALS — BP 138/67 | HR 61 | Ht 60.0 in | Wt 160.0 lb

## 2022-01-22 DIAGNOSIS — R413 Other amnesia: Secondary | ICD-10-CM | POA: Diagnosis not present

## 2022-01-22 MED ORDER — DONEPEZIL HCL 5 MG PO TABS: ORAL_TABLET | ORAL | 3 refills | Status: DC

## 2022-01-22 NOTE — Patient Instructions (Addendum)
Donepezil ?We recommended that you take Donepezil (Aricept) 5mg  tab once a day for the first 4 weeks, then increase the dose to 10mg  once per day. It is better to take Donepezil with breakfast or lunch. Aricept is well tolerated, although some people may experience nausea, diarrhea, not sleeping well, vomiting, muscle cramps, feeling tired, or not wanting to eat. These side effects were usually mild and temporary. If symptoms continue or become severe, you should stop the medication and contact .         ? ? ?PET scan of the brain ?

## 2022-01-22 NOTE — Telephone Encounter (Signed)
[employer] medic[employer] pending-once it is approved i will try for [employer]  ?

## 2022-02-05 NOTE — Telephone Encounter (Signed)
Right now the insurance is not approving the PET scan. It would have to be appeal by the insurance. ? ?The patient husband Deniece Portela called wanting to check the status- I called back and left a voicemail informing him Dr. Delena Bali is out of the office this week once she gets back I will see how she wants to proceed.  ?

## 2022-02-12 NOTE — Telephone Encounter (Signed)
Did they send a denial letter? I'd like to appeal it if able

## 2022-02-13 NOTE — Telephone Encounter (Signed)
Yes they sent a denial letter: the reason: ? ?Based on Medicare guidelines your healthcare provider told us that there is a concern related to a problem that affects your recall (memory). The request cannot be approved because: ? ?You must have been diagnosed (found to have) with dementia (a loss of memory that progresses over time). ? ?You must meet the criteria for Alzheimer's disease (a chronic disease that causes memory loss). ? ?You must meet the criteria for Frontotemporal Dementia (a chronic disease that causes memory loss). ? ?It must be need to differentiate between a diagnosis of frontotemporal dementia and Alzheimer's disease in order to help guide future treatment. Both conditions are long term diseases that cause memory loss.  ? ? ?"To Appeal we would need any evidence you want [place] to review, such as medical records, doctors letters (such as a doctors supporting statement if your request a fast appeal), or other information that explains why you need the medical service/item." ? ?So pretty much a written letter on the reason why this exam is needed for the patient and I will fax it along with everything else.  ? ?Fax number [phone]. ? ?Thank you! ?

## 2022-02-13 NOTE — Telephone Encounter (Signed)
Can we write an appeal letter for this PET scan? I ordered it to help differentiate between frontotemporal dementia and Alzheimer's disease so it should meet the criteria. It may help to include the neuropsychology notes from Dr Milbert Coulter as well. ? ?Thank you!

## 2022-02-14 NOTE — Telephone Encounter (Signed)
Faxed the appeal to [facility] as the fast appeal ph # [phone] & fax # [phone] ?

## 2022-02-18 NOTE — Telephone Encounter (Signed)
Orp[employer] Harvey: [id number] (exp. 02/14/22 to 08/17/22) & [employer] Numbers: [id number] (exp. 02/18/22 to 03/19/22) order sent to [facility] they will reach out to the patient to schedule.  ?

## 2022-02-18 NOTE — Telephone Encounter (Signed)
Left voicemail on patient husband wayne number to inform him that it has been approved and it was sent to [facility]. I also left their number of [phone].  ?

## 2022-02-19 NOTE — Telephone Encounter (Signed)
Patient husband is aware

## 2022-02-25 ENCOUNTER — Ambulatory Visit (HOSPITAL_COMMUNITY)
Admission: RE | Admit: 2022-02-25 | Discharge: 2022-02-25 | Disposition: A | Discharge status: Home / Self Care | Payer: Medicare HMO | Source: Ambulatory Visit | Attending: Psychiatry | Admitting: Psychiatry

## 2022-02-25 DIAGNOSIS — G309 Alzheimer's disease, unspecified: Secondary | ICD-10-CM | POA: Insufficient documentation

## 2022-02-25 DIAGNOSIS — R69 Illness, unspecified: Secondary | ICD-10-CM | POA: Diagnosis not present

## 2022-02-25 DIAGNOSIS — F0390 Unspecified dementia without behavioral disturbance: Secondary | ICD-10-CM | POA: Diagnosis not present

## 2022-02-25 LAB — GLUCOSE, CAPILLARY: Glucose-Capillary: 105 mg/dL — ABNORMAL HIGH (ref 70–99)

## 2022-02-25 MED ORDER — FLUDEOXYGLUCOSE F - 18 (FDG) INJECTION
10.0000 | Freq: Once | INTRAVENOUS | Status: AC
  Administered 2022-02-25: 9.96 via INTRAVENOUS

## 2022-02-27 ENCOUNTER — Telehealth: Payer: Self-pay | Admitting: *Deleted

## 2022-02-27 NOTE — Telephone Encounter (Signed)
Called husband and informed him the patient's PET scan shows decreased metabolism in the frontal lobes. This can be seen with frontotemporal dementia. It is not typically seen in Alzheimer's disease. We will recheck her memory at our next visit to see if it is stable. She can continue the memory medication for now.  He confirmed her FU,  verbalized understanding, appreciation. ? ?

## 2022-02-27 NOTE — Telephone Encounter (Signed)
Pt's husband is asking for a call back. °

## 2022-02-27 NOTE — Telephone Encounter (Signed)
LVM for husband asking for call back for patient's PET scan results. ?

## 2022-03-06 ENCOUNTER — Telehealth: Payer: Self-pay | Admitting: *Deleted

## 2022-03-06 ENCOUNTER — Other Ambulatory Visit: Payer: Self-pay | Admitting: Psychiatry

## 2022-03-06 MED ORDER — CITALOPRAM HYDROBROMIDE 10 MG/5ML PO SOLN: 10.0000 mg | Freq: Every day | ORAL | 11 refills | Status: DC

## 2022-03-06 NOTE — Telephone Encounter (Signed)
Husband returned call and I advised of new Rx that is liquid form, can be added to food. He asked for earlier FU; we scheduled that. He verbalized understanding, appreciation.

## 2022-03-06 NOTE — Telephone Encounter (Signed)
Husband was in office today and expressed concern that patient is not taking donepezil, is taking about people being in their house and taking things, seems paranoid. This is repeated behavior. He is asking what to do because her follow up is not till Oct.  I advised will let Dr Delena Bali know, call him back later. He verbalized understanding, appreciation.

## 2022-03-06 NOTE — Telephone Encounter (Signed)
LVM for husband requesting a call back.

## 2022-03-06 NOTE — Telephone Encounter (Signed)
I will send in a prescription for citalopram, this comes in a liquid form and may help with her behaviors. He can try to mix this with food. He can make an appointment to see me sooner if I have something available, otherwise he can see an NP if needed

## 2022-04-03 ENCOUNTER — Other Ambulatory Visit: Payer: Self-pay | Admitting: Psychiatry

## 2022-05-13 DIAGNOSIS — H40013 Open angle with borderline findings, low risk, bilateral: Secondary | ICD-10-CM | POA: Diagnosis not present

## 2022-05-14 DIAGNOSIS — Z0100 Encounter for examination of eyes and vision without abnormal findings: Secondary | ICD-10-CM | POA: Diagnosis not present

## 2022-05-14 DIAGNOSIS — H524 Presbyopia: Secondary | ICD-10-CM | POA: Diagnosis not present

## 2022-06-12 DIAGNOSIS — R410 Disorientation, unspecified: Secondary | ICD-10-CM | POA: Diagnosis not present

## 2022-06-18 ENCOUNTER — Encounter: Payer: Self-pay | Admitting: Psychiatry

## 2022-06-18 ENCOUNTER — Ambulatory Visit (INDEPENDENT_AMBULATORY_CARE_PROVIDER_SITE_OTHER): Payer: Medicare HMO | Admitting: Psychiatry

## 2022-06-18 VITALS — BP 141/68 | HR 49 | Ht 60.0 in | Wt 152.0 lb

## 2022-06-18 DIAGNOSIS — R69 Illness, unspecified: Secondary | ICD-10-CM | POA: Diagnosis not present

## 2022-06-18 DIAGNOSIS — F0280 Dementia in other diseases classified elsewhere without behavioral disturbance: Secondary | ICD-10-CM | POA: Diagnosis not present

## 2022-06-18 DIAGNOSIS — G3109 Other frontotemporal dementia: Secondary | ICD-10-CM | POA: Diagnosis not present

## 2022-06-18 MED ORDER — CITALOPRAM HYDROBROMIDE 10 MG PO TABS: 10.0000 mg | ORAL_TABLET | Freq: Every day | ORAL | 6 refills | Status: DC

## 2022-06-18 MED ORDER — RIVASTIGMINE 4.6 MG/24HR TD PT24: 4.6000 mg | MEDICATED_PATCH | Freq: Every day | TRANSDERMAL | 12 refills | Status: DC
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
## 2022-06-18 NOTE — Patient Instructions (Signed)
What is frontotemporal dementia? Frontotemporal dementia refers to a group of diseases that involve the deterioration of your brain's frontal and temporal lobes. As those areas deteriorate, you lose the abilities those parts controlled. People with FTD commonly lose control of their behavior or ability to speak and understand spoken language.  Who does it affect? Frontotemporal dementia is an age-related condition, but it happens sooner than most age-related conditions that affect your brain. Most people develop FTD conditions between the ages of 64 and 29, and the average age when it starts is 62. Overall, FTD appears to affect males and females equally. FTD is also a condition that can run in families, with about 40% of cases happening in people with a family history of FTD.  How common is this condition? FTD is an uncommon condition but happens often enough to be fairly well known. Experts estimate that it happens to between 15 and 22 people out of every 100,000. That means between 1.2 million and 1.8 million people have it worldwide.  How does this condition affect my body? FTD affects your brain's frontal and temporal lobes in the early and middle stages of the disease. As FTD affects those lobes, you lose certain abilities (listed below) because neurons in those areas stop working. Your frontal lobe, located right behind your forehead, is responsible for the following: Movement. Planning and decision-making. Judgment and reasoning. Social skills. Spoken language. Knowing what's appropriate and inappropriate. Self-control over what you do and say. Your temporal lobes are on the sides of your brain, immediately below and behind your frontal lobe. It handles the following: Hearing. Understanding spoken language. Memory. Emotional expression and processing.  SYMPTOMS AND CAUSES What are the symptoms? The symptoms of FTD depend on the affected parts of the brain. Even among identical  twins, no two brains are the same, so FTD affects everyone differently. Many of the symptoms are similar, but they often happen in different combinations, or they might be more or less severe. The symptoms of FTD fall under six categories: Loss of inhibitions. Apathy. Loss of empathy. Compulsive behaviors. Changes in diet or mouth-centered behaviors. Loss of executive function.  Loss of inhibitions Inhibition is when your brain tells you not to do something. Losing your inhibitions because of frontal lobe deterioration can look like any of the following: Loss of the "filter" for what you say. When this happens, you might say hurtful, rude or offensive things. For some people, this can seem like a major personality shift. Lack of respect for others. This often involves getting too close to people (ignoring their personal space) or touching them in unwelcome ways. Inappropriate sexual comments or actions are also common. Impulsive actions and behaviors. These are usually risky behaviors, such as reckless gambling or spending. Criminal behaviors like shoplifting are also possible. Apathy Healthcare providers commonly mistake apathy for depression because the two have many similarities. Apathy tends to look like the following: Loss of motivation. Social isolation. Decline in self-care and hygiene. Loss of empathy People who have a loss of empathy (sometimes known as "emotional blunting") may have trouble reading the emotions of others. That may look like they're behaving in a cold, unfeeling or uncaring way. Compulsive behaviors People with FTD often behave in noticeably different ways from people without this condition. Sometimes, behavior changes are small and happen in very limited ways. For others, the changes might be more complicated, involving multiple steps or a strict routine. Some examples include: Repetitive motions. People with FTD often repeat  small-scale movements, such as clapping  their hands, tapping their feet, pacing, etc. Complex or ritual-like behaviors. Compulsively watching the same movies, reading the same books or collecting types of items. Hoarding items also falls under this category. Speech repetition. A person with FTD may repeat the same sounds, words or phrases. Changes in diet or mouth-centered behaviors People with FTD often have a symptom known as "hyperorality," which means they overeat, eat things that aren't food (this is a condition known as pica), or have mouth-centered compulsive behaviors (like smoking or using their mouth to feel things in a way similar to normal exploring behavior in babies). Loss of executive function without losing other abilities Executive function is your ability to plan and solve problems, stay organized and motivate yourself to carry out tasks. People with FTD have trouble with executive function, but other abilities like how you process what you see and your memory aren't affected until later stages of the disease.  What causes frontotemporal dementia? FTD happens when neurons, a key type of brain cell, deteriorate. This usually happens when there's a malfunction in how your body creates certain proteins. A key part of how proteins work is their shape. Much like how a key won't turn or open a lock if it's not the right shape, your cells can't use proteins when they're not the right shape. Your cells often can't break those faulty proteins down and get rid of them. With nowhere to go, those misshapen proteins can tangle and clump together. Over time, these faulty proteins accumulate in and around your neurons, damaging those cells until they don't work at all. Experts have linked misfolded proteins with FTD and its related conditions, such as Pick's disease. Misfolded proteins also play a role in conditions like Alzheimer's disease. These protein malfunctions happen with certain DNA mutations. DNA is like an instruction manual for  your cells, telling them how to do a specific job. Mutations are like typos in the manual. Your cells strictly follow DNA instructions, so even small mutations can cause problems. Some DNA mutations run in families, which is why about 40% of FTD cases involve a family history of the disease. Mutations can also happen spontaneously, meaning you developed the mutation and didn't get it from your parents. While they aren't causes, two other factors can increase the risk of developing FTD. One is having a history of head trauma, which more than triples your risk of developing FTD. Thyroid disease is also linked, making FTD 2.5 times more likely to develop. Is it contagious? FTD isn't contagious, and you can't pass it from person to person. However, FTD does run in families, so your chance of developing it increases if you have a family member (especially a parent or sibling) who has FTD.  DIAGNOSIS AND TESTS How is it diagnosed? A healthcare provider, usually a neurologist, can diagnose FTD based on your medical history and a physical and neurological examination (where a healthcare provider looks for signs and symptoms of a problem). Some lab tests are also possible, and imaging tests are important because they can show areas of your brain where deterioration is happening. In addition to neurological examinations, healthcare providers will often have you do a neurocognitive assessment. In this test, you'll do tasks or answer questions. Based on how you do on the test, providers can identify whether or not you have problems in certain areas of your brain, which can help narrow down (or rule out) whether or not you have FTD.  MANAGEMENT  AND TREATMENT How is it treated, and is there a cure? FTD is not curable, and there's no way to treat it directly. It's also impossible to slow the progress of the disease. Healthcare providers may recommend treating some of the symptoms, but this can vary from case to case.  Your healthcare provider is the best person to tell you what treatments -- if any -- they recommend. How do I take care of myself or manage FTD symptoms? FTD isn't a condition you can diagnose on your own. Because of that, you shouldn't try to manage the symptoms without first talking to a healthcare provider. PREVENTION How can I prevent FTD or reduce my risk of developing it? FTD happens unpredictably, so there's no way to prevent it. But, it might be possible to reduce your risk of developing it. The sole way to reduce your risk of developing FTD is to avoid head injuries. Having a past head injury more than triples your risk of developing FTD. A key way to avoid head injuries is to use safety equipment whenever necessary. Helmets and safety restraints (especially seat belts in moving vehicles) can help prevent head injuries or reduce how severe they are if they happen.  OUTLOOK / PROGNOSIS What can I expect if I have this condition? FTD is a degenerative brain disease. That means that the effects on your brain get worse over time. Memory loss isn't usually a problem until later in the disease, but other symptoms and effects are likely. Depending on the type of FTD you have, you'll likely lose one of the following: Control over your behavior. Ability to speak. Ability to understand others when they speak. People with FTD commonly also develop a problem known as anosognosia. This means "lack of insight," and it causes a problem with how your brain processes symptoms or evidence that you have a medical condition. That means you lose the ability to tell that you have a medical condition and to understand what this means for you in the long run. Because this condition gradually affects your ability to control your actions or communicate, most people who have it eventually can't live independently. Commonly, people with this condition need 24/7 skilled medical care, such as in a skilled nursing facility  or long-term care setting. How long does FTD last? FTD is a permanent, life-long condition. What's the outlook for this condition? FTD gradually affects more and more areas of your brain, disrupting the abilities those areas control. The average life expectancy for a person after diagnosis with FTD is 7.5 years. While FTD isn't fatal on its own, it often causes other issues that are serious or even life-threatening. One common problem that often happens as FTD gets worse is dysphagia (trouble swallowing). Having dysphagia causes problems with eating, drinking and speaking, and increases the risk of developing pneumonia or respiratory failure. How can I ensure my wishes are followed when I can't choose myself? If you have an early diagnosis of frontotemporal dementia of any kind, you may want to talk to your healthcare provider, your family or loved ones, and to anyone you trust to make important decisions for you. These discussions are important because they can help you ensure caregivers can honor your wishes if you can't choose for yourself in the future. While these conversations often feel unpleasant or difficult, having them sooner rather than later can help avoid confusion about what you want for yourself in the years to come. They also mean your loved ones don't face  the painful, difficult situation of having to guess what you wanted. In addition to those conversations, you should also put your wishes and decisions in writing. That includes preparing documents connected to legal issues and what happens if you can't care for yourself or make decisions for your care or well-being. Many people choose to consult an attorney when preparing these documents. However, many of these are ones you can prepare on your own (you may need a notary or other official to endorse them, depending on the laws in your area).  LIVING WITH How do I take care of myself? Having FTD means you'll eventually need long-term  care because the condition affects your ability to care for yourself. Your healthcare provider can tell you more about what to expect with this. They can also direct you to resources on care options and services that might help you (or your loved one, if you're a caregiver).  FREQUENTLY ASKED QUESTIONS What can I do if a loved one shows signs of frontotemporal dementia or a similar condition? People with FTD often lack insight, which means they can't recognize their symptoms or condition. Because they can't see the problem, they often don't believe they need medical care or treatment. That lack of understanding can lead to frustration or fear for the person with the symptoms and their loved ones. If you notice a loved one showing signs of FTD or a related condition, you can try to help them by doing the following: Ask how you can help. People with FTD may show symptoms without realizing that these are symptoms of a serious brain problem. Listening and offering help can help them feel connected and may encourage them to see a healthcare provider. Encourage them to see someone who can help. Frontotemporal dementia is a condition that isn't treatable or curable, but some of the symptoms might be treatable. Treatment can sometimes improve the quality of life for a person with this condition or make it easier for loved ones to care for them. Specialized care can offer some relief from some of the disabling symptoms for people with this condition or for loved ones who are caregivers. Stay calm and don't take things personally. People with FTD often can't control their behaviors or the things they say. This may cause misunderstandings and is often embarrassing or hurtful. However, it's important to remember that this is a medical problem and not a problem that a person with FTD can control. Don't be afraid to ask for assistance. Caring for someone with FTD often gets more difficult, especially as the condition  worsens. If you're caring for someone with FTD, don't be afraid to ask for help or resources. There are often support systems and services available. These resources and services include adult day care, respite and home health nursing care. Long-term care might be the best option. Caring for a loved one with FTD can be as difficult and require as much effort as a full-time job. Unfortunately, not everyone has the time or ability to care for a loved one with FTD. That means it's important to consider if your loved one needs long-term, skilled care in a specialized facility. Though this isn't an easy choice to make, it's often the best option to make sure your loved one is safe, comfortable, and has qualified people caring for them at all times. Are there stages of frontotemporal dementia? While experts have established numbered stages for other degenerative brain conditions such as Alzheimer's disease, there isn't a numbered system  for FTD. That's because FTD can happen in different ways and affects every person differently. What is the life expectancy of FTD? The average life expectancy after a diagnosis of FTD is 7.5 years. But the condition can progress faster or slower. Your healthcare provider (or the provider for your loved one) is the best person to tell you more about the progress of the disease and the likely timeline. They can also update you throughout the course of the disease if it progresses faster or slower than expected. Is FTD worse than Alzheimer's disease? FTD and Alzheimer's disease are both conditions that can severely affect and disrupt a person's life. Generally, FTD affects people at a younger age than Alzheimer's disease. But there's no way to define whether one condition is better or worse than the other because these conditions affect everyone differently. What are the early signs of frontal lobe dementia? When frontotemporal dementia affects the frontal lobe first, this causes the  behavioral variant of this condition. The earliest signs and symptoms of this condition include: The symptoms of bvFTD fall under six categories: Loss of inhibitions. This means a person has trouble controlling themselves. That can lead to them making inappropriate comments or behaving in socially unacceptable ways. Apathy. This usually causes a lack of interest or motivation. This is often similar to (or mistaken for) depression. Loss of empathy. This means a person has trouble reading other people's emotions or acts as if they don't care about the feelings of others. Compulsive behaviors. This usually means repetitive movements or behaviors that they can't control. Changes in diet or mouth-centered behaviors. This usually causes people with FTD to overeat or eat things that aren't food. They may also put things in their mouth, similar to how babies do as they explore objects around them. Loss of executive function. This causes a person to have trouble planning, carrying out tasks or motivating themselves to do certain things

## 2022-07-10 ENCOUNTER — Other Ambulatory Visit: Payer: Self-pay | Admitting: Psychiatry

## 2022-07-24 ENCOUNTER — Ambulatory Visit: Payer: Medicare HMO | Admitting: Psychiatry

## 2022-10-22 NOTE — Progress Notes (Unsigned)
   CC:  frontotemporal dementia  Follow-up Visit  Last visit: 06/18/22  Brief HPI: 72 year old female with a history of arthritis, B12 deficiency who follows in clinic for dementia. MRI brain with perisylvian atrophy. Neuropsych testing in April 2023 was most consistent with mild cognitive impairment, but there was concern for possible early Alzheimers vs FTD. PET scan 02/25/22 showed decreased metabolism in the frontal lobes, suggestive of frontotemporal dementia.  At her last visit she was not taking her oral medications and she was started on a rivastigmine patch.  Interval History: *** Memory*** rivastigmine*** Mood*** citalopram***  Physical Exam:   Vital Signs: There were no vitals taken for this visit. GENERAL:  well appearing, in no acute distress, alert  SKIN:  Color, texture, turgor normal. No rashes or lesions HEAD:  Normocephalic/atraumatic. RESP: normal respiratory effort MSK:  No gross joint deformities.   NEUROLOGICAL: Mental Status: Alert, oriented to person, place and time, Follows commands, and Speech fluent and appropriate. Cranial Nerves: PERRL, face symmetric, no dysarthria, hearing grossly intact Motor: moves all extremities equally Gait: normal-based.  IMPRESSION: ***  PLAN: ***   Follow-up: ***  I spent a total of *** minutes on the date of the service. Discussed medication side effects, adverse reactions and drug interactions. Written educational materials and patient instructions outlining all of the above were given.  Genia Harold, MD

## 2022-10-23 ENCOUNTER — Ambulatory Visit (INDEPENDENT_AMBULATORY_CARE_PROVIDER_SITE_OTHER): Payer: Medicare HMO | Admitting: Psychiatry

## 2022-10-23 ENCOUNTER — Telehealth: Payer: Self-pay | Admitting: Psychiatry

## 2022-10-23 VITALS — BP 132/77 | HR 51 | Ht 60.0 in | Wt 144.8 lb

## 2022-10-23 DIAGNOSIS — F02A3 Dementia in other diseases classified elsewhere, mild, with mood disturbance: Secondary | ICD-10-CM | POA: Diagnosis not present

## 2022-10-23 DIAGNOSIS — R69 Illness, unspecified: Secondary | ICD-10-CM | POA: Diagnosis not present

## 2022-10-23 DIAGNOSIS — G3109 Other frontotemporal dementia: Secondary | ICD-10-CM | POA: Diagnosis not present

## 2022-10-23 NOTE — Telephone Encounter (Signed)
Pt has been accepted by [facility], they will reach out to her to set up. 

## 2022-10-23 NOTE — Patient Instructions (Signed)
[facility] family support program for dementia: [email or web address]

## 2022-10-26 ENCOUNTER — Encounter: Payer: Self-pay | Admitting: Psychiatry

## 2022-10-30 NOTE — Telephone Encounter (Signed)
Tori or Jillian send [facility] referrals.Please route Home Health referrals concerns to [place] or [place]. Thank you

## 2023-01-21 NOTE — Progress Notes (Unsigned)
   CC:  frontotemporal dementia  Follow-up Visit  Last visit: 10/23/22  Brief HPI: 72 year old female with a history of arthritis, B12 deficiency who follows in clinic for dementia. MRI brain with perisylvian atrophy. Neuropsych testing in April 2023 was most consistent with mild cognitive impairment, but there was concern for possible early Alzheimers vs FTD. PET scan 02/25/22 showed decreased metabolism in the frontal lobes, suggestive of frontotemporal dementia.   At her last visit she was continued on rivastigmine patch and citalopram. Interval History: ***   Physical Exam:   Vital Signs: There were no vitals taken for this visit. GENERAL:  well appearing, in no acute distress, alert  SKIN:  Color, texture, turgor normal. No rashes or lesions HEAD:  Normocephalic/atraumatic. RESP: normal respiratory effort MSK:  No gross joint deformities.   NEUROLOGICAL: Mental Status: Alert, oriented to person, place and time, Follows commands, and Speech fluent and appropriate. Cranial Nerves: PERRL, face symmetric, no dysarthria, hearing grossly intact Motor: moves all extremities equally Gait: normal-based.  IMPRESSION: ***  PLAN: ***   Follow-up: ***  I spent a total of *** minutes on the date of the service. Discussed medication side effects, adverse reactions and drug interactions. Written educational materials and patient instructions outlining all of the above were given.  Ocie Doyne, MD

## 2023-01-22 ENCOUNTER — Ambulatory Visit (INDEPENDENT_AMBULATORY_CARE_PROVIDER_SITE_OTHER): Payer: Medicare HMO | Admitting: Psychiatry

## 2023-01-22 ENCOUNTER — Encounter: Payer: Self-pay | Admitting: Psychiatry

## 2023-01-22 VITALS — BP 140/72 | HR 56 | Ht 61.0 in | Wt 146.0 lb

## 2023-01-22 DIAGNOSIS — G3109 Other frontotemporal dementia: Secondary | ICD-10-CM | POA: Diagnosis not present

## 2023-01-22 DIAGNOSIS — F02A3 Dementia in other diseases classified elsewhere, mild, with mood disturbance: Secondary | ICD-10-CM | POA: Diagnosis not present

## 2023-01-22 DIAGNOSIS — R69 Illness, unspecified: Secondary | ICD-10-CM | POA: Diagnosis not present

## 2023-01-27 ENCOUNTER — Other Ambulatory Visit: Payer: Self-pay | Admitting: Psychiatry

## 2023-01-27 NOTE — Telephone Encounter (Signed)
Last seen on 01/22/23  Follow up scheduled on 10/01/23 Last filled on 11/02/22 # 90 tablets ( 90 day supply)

## 2023-05-05 ENCOUNTER — Encounter: Payer: Self-pay | Admitting: Psychiatry

## 2023-05-06 ENCOUNTER — Encounter: Payer: Self-pay | Admitting: Neurology

## 2023-05-06 ENCOUNTER — Encounter: Payer: Self-pay | Admitting: Psychiatry

## 2023-05-06 NOTE — Telephone Encounter (Signed)
I sent a letter for her via mychart

## 2023-06-02 ENCOUNTER — Telehealth: Payer: Self-pay | Admitting: Psychiatry

## 2023-06-02 NOTE — Telephone Encounter (Signed)
LVM and sent mychart msg informing pt of need to reschedule 10/01/23 appt - MD leaving practice

## 2023-06-22 ENCOUNTER — Other Ambulatory Visit: Payer: Self-pay | Admitting: Psychiatry

## 2023-06-24 NOTE — Telephone Encounter (Signed)
Last seen on 01/22/23 Follow up scheduled on 08/16/23

## 2023-06-26 ENCOUNTER — Telehealth: Payer: Self-pay | Admitting: Psychiatry

## 2023-06-26 NOTE — Telephone Encounter (Signed)
REQUIRED PHONE NOTE: Spouse called to confirm appointment date and time pt is scheduled for. This is FYI, no call back requested

## 2023-06-26 NOTE — Telephone Encounter (Signed)
Ok 09/08/23

## 2023-07-11 ENCOUNTER — Encounter (HOSPITAL_BASED_OUTPATIENT_CLINIC_OR_DEPARTMENT_OTHER): Payer: Self-pay

## 2023-07-11 ENCOUNTER — Other Ambulatory Visit: Payer: Self-pay

## 2023-07-11 ENCOUNTER — Emergency Department (HOSPITAL_BASED_OUTPATIENT_CLINIC_OR_DEPARTMENT_OTHER)
Admission: EM | Admit: 2023-07-11 | Discharge: 2023-07-11 | Disposition: A | Discharge status: Home / Self Care | Payer: Medicare HMO | Attending: Emergency Medicine | Admitting: Emergency Medicine

## 2023-07-11 DIAGNOSIS — S61431A Puncture wound without foreign body of right hand, initial encounter: Secondary | ICD-10-CM | POA: Insufficient documentation

## 2023-07-11 DIAGNOSIS — Z23 Encounter for immunization: Secondary | ICD-10-CM | POA: Diagnosis not present

## 2023-07-11 DIAGNOSIS — S6991XA Unspecified injury of right wrist, hand and finger(s), initial encounter: Secondary | ICD-10-CM | POA: Diagnosis present

## 2023-07-11 DIAGNOSIS — S61451A Open bite of right hand, initial encounter: Secondary | ICD-10-CM | POA: Diagnosis not present

## 2023-07-11 DIAGNOSIS — S61432A Puncture wound without foreign body of left hand, initial encounter: Secondary | ICD-10-CM | POA: Insufficient documentation

## 2023-07-11 DIAGNOSIS — Z91040 Latex allergy status: Secondary | ICD-10-CM | POA: Insufficient documentation

## 2023-07-11 DIAGNOSIS — W540XXA Bitten by dog, initial encounter: Secondary | ICD-10-CM | POA: Insufficient documentation

## 2023-07-11 DIAGNOSIS — Y9389 Activity, other specified: Secondary | ICD-10-CM | POA: Diagnosis not present

## 2023-07-11 MED ORDER — AMOXICILLIN-POT CLAVULANATE 875-125 MG PO TABS
1.0000 | ORAL_TABLET | Freq: Once | ORAL | Status: AC
  Administered 2023-07-11: 1 via ORAL
  Filled 2023-07-11: qty 1

## 2023-07-11 MED ORDER — AMOXICILLIN-POT CLAVULANATE 875-125 MG PO TABS: 1.0000 | ORAL_TABLET | Freq: Two times a day (BID) | ORAL | 0 refills | Status: DC

## 2023-07-11 MED ORDER — TETANUS-DIPHTH-ACELL PERTUSSIS 5-2.5-18.5 LF-MCG/0.5 IM SUSY
0.5000 mL | PREFILLED_SYRINGE | Freq: Once | INTRAMUSCULAR | Status: AC
  Administered 2023-07-11: 0.5 mL via INTRAMUSCULAR
  Filled 2023-07-11: qty 0.5

## 2023-07-11 NOTE — ED Triage Notes (Signed)
Pov from home, alert and oriented at baseline, hx of dementia, amb to room  Family sts dog bite by son's dog yesterday, puncture and many small lacerations to right hand bleeding controlled, animals vaccines UTD per family, pt needs TDAP.

## 2023-07-11 NOTE — Discharge Instructions (Addendum)
Soak your hand in warm water several times a day.  You should have your hand rechecked in 3 days to make sure it is responding to antibiotics.

## 2023-07-11 NOTE — ED Provider Notes (Signed)
[facility] AT [facility] Provider Note   CSN: [id number] Arrival date & time: 07/11/23  0556     History  Chief Complaint  Patient presents with   Animal Bite    [name] Young is a 72 y.o. female.  The history is provided by the patient.  Animal Bite She was playing with a dog when it bit her on the right hand.  This happened yesterday.  The dog is up-to-date on all of its immunizations, but patient does not know when her last tetanus immunization was.   Home Medications Prior to Admission medications   Medication Sig Start Date End Date Taking? Authorizing Provider  amoxicillin-clavulanate (AUGMENTIN) 875-125 MG tablet Take 1 tablet by mouth every 12 (twelve) hours. 07/11/23  Yes Dione Booze, MD  acetaminophen (TYLENOL) 500 MG tablet Take 2 tablets (1,000 mg total) by mouth every 8 (eight) hours as needed for mild pain. 02/26/19   Juliet Rude, PA-C  celecoxib (CELEBREX) 200 MG capsule 200 mg 2 (two) times daily. 07/24/21   [provider]  citalopram (CELEXA) 10 MG tablet TAKE 1 TABLET BY MOUTH EVERY DAY 01/27/23   Ocie Doyne, MD  rivastigmine (EXELON) 4.6 mg/24hr PLACE 1 PATCH ONTO THE SKIN DAILY. 06/24/23   Ocie Doyne, MD      Allergies    Codeine, Latex, Mobic [meloxicam], and Tape    Review of Systems   Review of Systems  All other systems reviewed and are negative.   Physical Exam Updated Vital Signs BP (!) 144/56   Pulse 60   Temp 98 F (36.7 C)   Resp 18   Ht 5\' 1"  (1.549 m)   Wt 66.2 kg   SpO2 96%   BMI 27.58 kg/m  Physical Exam Vitals and nursing note reviewed.   72 year old female, resting comfortably and in no acute distress. Vital signs are significant for mild elevated blood pressure. Oxygen saturation is 96%, which is normal. Head is normocephalic and atraumatic.  Lungs are clear without rales, wheezes, or rhonchi. Chest is nontender. Heart has regular rate and rhythm without murmur. Abdomen is  soft, flat, nontender. Extremities: Multiple small punctures are present on the hands and fingers of the right hand without discrete lacerations that are suturable.  There is erythema and swelling around the palm over the fourth and fifth metacarpals and over and the right third and fourth fingers.. Skin is warm and dry without rash. Neurologic: Mental status is normal, moves all extremities equally.       ED Results / Procedures / Treatments   Labs (all labs ordered are listed, but only abnormal results are displayed) Labs Reviewed - No data to display  EKG None  Radiology No results found.  Procedures Procedures    Medications Ordered in ED Medications  Tdap (BOOSTRIX) injection 0.5 mL (has no administration in time range)  amoxicillin-clavulanate (AUGMENTIN) 875-125 MG per tablet 1 tablet (has no administration in time range)    ED Course/ Medical Decision Making/ A&P                                 Medical Decision Making Risk Prescription drug management.   Dog bite of the right hand with multiple small punctures but no discrete laceration.  Erythema and swelling are concerning for developing cellulitis.  I have ordered Tdap booster and initial dose of amoxicillin-clavulanate.  I am discharging her with a  prescription for amoxicillin-clavulanate and instructed her to have the hand rechecked in 3 days to make sure that is responding appropriately to antibiotics.  Final Clinical Impression(s) / ED Diagnoses Final diagnoses:  Dog bite of multiple sites of right hand and fingers, initial encounter    Rx / DC Orders ED Discharge Orders          Ordered    amoxicillin-clavulanate (AUGMENTIN) 875-125 MG tablet  Every 12 hours        07/11/23 0629              Dione Booze, MD 07/11/23 [phone]

## 2023-07-11 NOTE — ED Notes (Signed)
Lacerations noted between right 4th and 5th finger, and laceration to 1st finger. Abrasions noted to right hand. Patietn reports this incident occurred 12+ hours ago per patient's husband.

## 2023-07-14 DIAGNOSIS — S61431D Puncture wound without foreign body of right hand, subsequent encounter: Secondary | ICD-10-CM | POA: Diagnosis not present

## 2023-07-14 DIAGNOSIS — W540XXD Bitten by dog, subsequent encounter: Secondary | ICD-10-CM | POA: Diagnosis not present

## 2023-07-14 DIAGNOSIS — L03113 Cellulitis of right upper limb: Secondary | ICD-10-CM | POA: Diagnosis not present

## 2023-07-15 ENCOUNTER — Encounter: Payer: Self-pay | Admitting: Diagnostic Neuroimaging

## 2023-07-15 DIAGNOSIS — Z515 Encounter for palliative care: Secondary | ICD-10-CM | POA: Diagnosis not present

## 2023-07-15 DIAGNOSIS — M069 Rheumatoid arthritis, unspecified: Secondary | ICD-10-CM | POA: Diagnosis not present

## 2023-07-15 DIAGNOSIS — G3109 Other frontotemporal dementia: Secondary | ICD-10-CM | POA: Diagnosis not present

## 2023-07-23 ENCOUNTER — Other Ambulatory Visit: Payer: Self-pay | Admitting: Anesthesiology

## 2023-07-23 MED ORDER — REXULTI 0.5 MG PO TABS: 0.5000 mg | ORAL_TABLET | Freq: Every day | ORAL | 6 refills | Status: DC

## 2023-07-23 NOTE — Telephone Encounter (Signed)
Meds ordered this encounter  Medications   Brexpiprazole (REXULTI) 0.5 MG TABS    Sig: Take 1 tablet (0.5 mg total) by mouth daily.    Dispense:  30 tablet    Refill:  6    Suanne Marker, MD 07/23/2023, 3:56 PM Certified in Neurology, Neurophysiology and Neuroimaging  [facility] [address], [address] [place], [place] [address] [phone]

## 2023-07-23 NOTE — Addendum Note (Signed)
Addended by: Joycelyn Schmid R on: 07/23/2023 03:56 PM   Modules accepted: Orders

## 2023-07-28 ENCOUNTER — Telehealth: Payer: Self-pay | Admitting: Neurology

## 2023-07-28 NOTE — Telephone Encounter (Signed)
Attempted a PA for [employer] and it does not need the PA for  insurance.

## 2023-07-29 MED ORDER — QUETIAPINE FUMARATE 25 MG PO TABS: 25.0000 mg | ORAL_TABLET | Freq: Every day | ORAL | 6 refills | Status: DC | PRN

## 2023-07-29 NOTE — Addendum Note (Signed)
Addended by: Joycelyn Schmid R on: 07/29/2023 12:39 PM   Modules accepted: Orders

## 2023-07-29 NOTE — Telephone Encounter (Signed)
Meds ordered this encounter  Medications   DISCONTD: Brexpiprazole (REXULTI) 0.5 MG TABS    Sig: Take 1 tablet (0.5 mg total) by mouth daily.    Dispense:  30 tablet    Refill:  6   QUEtiapine (SEROQUEL) 25 MG tablet    Sig: Take 1 tablet (25 mg total) by mouth daily as needed.    Dispense:  30 tablet    Refill:  6    Suanne Marker, MD 07/29/2023, 12:39 PM Certified in Neurology, Neurophysiology and Neuroimaging  [facility] [address], [address] [place], [place] [address] [phone]

## 2023-08-07 ENCOUNTER — Telehealth: Payer: Self-pay | Admitting: Diagnostic Neuroimaging

## 2023-08-07 NOTE — Telephone Encounter (Signed)
LVM and sent MyChart msg informing pt of r/s needed for 11/25 appt- MD out.

## 2023-08-13 DIAGNOSIS — Z6829 Body mass index (BMI) 29.0-29.9, adult: Secondary | ICD-10-CM | POA: Diagnosis not present

## 2023-08-13 DIAGNOSIS — M79641 Pain in right hand: Secondary | ICD-10-CM | POA: Diagnosis not present

## 2023-08-13 DIAGNOSIS — M79642 Pain in left hand: Secondary | ICD-10-CM | POA: Diagnosis not present

## 2023-08-13 DIAGNOSIS — E663 Overweight: Secondary | ICD-10-CM | POA: Diagnosis not present

## 2023-08-13 DIAGNOSIS — Z79899 Other long term (current) drug therapy: Secondary | ICD-10-CM | POA: Diagnosis not present

## 2023-08-13 DIAGNOSIS — R5383 Other fatigue: Secondary | ICD-10-CM | POA: Diagnosis not present

## 2023-08-13 DIAGNOSIS — F0280 Dementia in other diseases classified elsewhere without behavioral disturbance: Secondary | ICD-10-CM | POA: Diagnosis not present

## 2023-08-13 DIAGNOSIS — M545 Low back pain, unspecified: Secondary | ICD-10-CM | POA: Diagnosis not present

## 2023-08-13 DIAGNOSIS — M1991 Primary osteoarthritis, unspecified site: Secondary | ICD-10-CM | POA: Diagnosis not present

## 2023-08-13 DIAGNOSIS — G3109 Other frontotemporal dementia: Secondary | ICD-10-CM | POA: Diagnosis not present

## 2023-08-29 DIAGNOSIS — F22 Delusional disorders: Secondary | ICD-10-CM | POA: Diagnosis not present

## 2023-08-29 DIAGNOSIS — Z515 Encounter for palliative care: Secondary | ICD-10-CM | POA: Diagnosis not present

## 2023-08-29 DIAGNOSIS — G3109 Other frontotemporal dementia: Secondary | ICD-10-CM | POA: Diagnosis not present

## 2023-09-02 ENCOUNTER — Ambulatory Visit (INDEPENDENT_AMBULATORY_CARE_PROVIDER_SITE_OTHER): Payer: Medicare HMO | Admitting: Podiatry

## 2023-09-02 ENCOUNTER — Encounter: Payer: Self-pay | Admitting: Podiatry

## 2023-09-02 ENCOUNTER — Other Ambulatory Visit: Payer: Self-pay

## 2023-09-02 DIAGNOSIS — M79675 Pain in left toe(s): Secondary | ICD-10-CM

## 2023-09-02 DIAGNOSIS — B351 Tinea unguium: Secondary | ICD-10-CM | POA: Diagnosis not present

## 2023-09-02 DIAGNOSIS — M79674 Pain in right toe(s): Secondary | ICD-10-CM | POA: Diagnosis not present

## 2023-09-04 NOTE — Progress Notes (Signed)
  Subjective:  Patient ID: [name] Young, female    DOB: 1951/06/11,  MRN: [id number]  Chief Complaint  Patient presents with   Nail Problem    Patient is here for routine nail care and discoloration of nails    72 y.o. female presents with the above complaint. History confirmed with patient.  Her nails are thickened and elongated and cause discomfort.  Her husband is unable to take care of them.  Objective:  Physical Exam: warm, good capillary refill, no trophic changes or ulcerative lesions, normal DP and PT pulses, and normal sensory exam. Left Foot: dystrophic yellowed discolored nail plates with subungual debris Right Foot: dystrophic yellowed discolored nail plates with subungual debris  Assessment:   1. Pain due to onychomycosis of toenails of both feet      Plan:  Patient was evaluated and treated and all questions answered.  Discussed the etiology and treatment options for the condition in detail with the patient. Recommended debridement of the nails today. Sharp and mechanical debridement performed of all painful and mycotic nails today. Nails debrided in length and thickness using a nail nipper to level of comfort. Discussed treatment options including appropriate shoe gear. Follow up as needed for painful nails.    Return in about 3 months (around 12/03/2023) for painful thick fungal nails.

## 2023-09-08 ENCOUNTER — Ambulatory Visit: Payer: Medicare HMO | Admitting: Diagnostic Neuroimaging

## 2023-09-10 DIAGNOSIS — Z6830 Body mass index (BMI) 30.0-30.9, adult: Secondary | ICD-10-CM | POA: Diagnosis not present

## 2023-09-10 DIAGNOSIS — E669 Obesity, unspecified: Secondary | ICD-10-CM | POA: Diagnosis not present

## 2023-09-10 DIAGNOSIS — M79642 Pain in left hand: Secondary | ICD-10-CM | POA: Diagnosis not present

## 2023-09-10 DIAGNOSIS — G3109 Other frontotemporal dementia: Secondary | ICD-10-CM | POA: Diagnosis not present

## 2023-09-10 DIAGNOSIS — M1991 Primary osteoarthritis, unspecified site: Secondary | ICD-10-CM | POA: Diagnosis not present

## 2023-09-10 DIAGNOSIS — M79641 Pain in right hand: Secondary | ICD-10-CM | POA: Diagnosis not present

## 2023-09-16 ENCOUNTER — Encounter: Payer: Self-pay | Admitting: Diagnostic Neuroimaging

## 2023-09-16 ENCOUNTER — Ambulatory Visit (INDEPENDENT_AMBULATORY_CARE_PROVIDER_SITE_OTHER): Payer: Medicare HMO | Admitting: Diagnostic Neuroimaging

## 2023-09-16 DIAGNOSIS — F03B18 Unspecified dementia, moderate, with other behavioral disturbance: Secondary | ICD-10-CM

## 2023-09-16 NOTE — Addendum Note (Signed)
Addended by: Judi Cong on: 09/16/2023 09:48 AM   Modules accepted: Orders

## 2023-09-16 NOTE — Telephone Encounter (Signed)
Called.

## 2023-09-16 NOTE — Progress Notes (Signed)
[facility]  PATIENT: [name] Young DOB: 1951-02-24  REFERRING CLINICIAN: Renford Dills, MD HISTORY FROM: patient  REASON FOR VISIT: new consult   HISTORICAL  CHIEF COMPLAINT:  Chief Complaint  Patient presents with   Follow-up    Pt in 7  with husband amd son  Son states short term is worse     HISTORY OF PRESENT ILLNESS:   72 year old female here for evaluation of dementia.  Symptoms started around 2018 with mild confabulation and delusions.  Symptoms gradually worsened over time.  In 2020 she was having some additional cognitive declines and changes.  In 2023 she saw Dr. Lauris Chroman, had neurologic evaluation and neuropsychology testing, was diagnosed with mild dementia.  Possibility of FTD (or PPA) versus AD was raised.  FDG PET scan was more consistent with frontotemporal dementia.  Since that time symptoms have continued to decline.  She is currently on combination of Seroquel and mirtazapine for symptom control.  She is lives at home with husband.  Her son lives nearby.    REVIEW OF SYSTEMS: Full 14 system review of systems performed and negative with exception of: as per HPI.  ALLERGIES: Allergies  Allergen Reactions   Codeine Itching    rash   Latex Dermatitis and Other (See Comments)    Irritates the skin   Mobic [Meloxicam] Other (See Comments)    Dizzy/ lightheaded   Tape Dermatitis and Other (See Comments)    Paper tape is preferred     HOME MEDICATIONS: Outpatient Medications Prior to Visit  Medication Sig Dispense Refill   acetaminophen (TYLENOL) 500 MG tablet Take 2 tablets (1,000 mg total) by mouth every 8 (eight) hours as needed for mild pain. (Patient taking differently: Take 1,000 mg by mouth as needed for mild pain (pain score 1-3).)     mirtazapine (REMERON) 15 MG tablet Take 15 mg by mouth at bedtime.     QUEtiapine (SEROQUEL) 50 MG tablet Take 50 mg by mouth 2 (two) times daily. 25mg  at noon; 75mg  at bedtime     celecoxib  (CELEBREX) 200 MG capsule 200 mg 2 (two) times daily.     QUEtiapine Fumarate (SEROQUEL PO) Take 15 mg by mouth at bedtime.     amoxicillin-clavulanate (AUGMENTIN) 875-125 MG tablet Take 1 tablet by mouth every 12 (twelve) hours. 14 tablet 0   citalopram (CELEXA) 10 MG tablet TAKE 1 TABLET BY MOUTH EVERY DAY (Patient not taking: Reported on 09/16/2023) 90 tablet 1   rivastigmine (EXELON) 4.6 mg/24hr PLACE 1 PATCH ONTO THE SKIN DAILY. (Patient not taking: Reported on 09/16/2023) 90 patch 1   No facility-administered medications prior to visit.      PHYSICAL EXAM  GENERAL EXAM/CONSTITUTIONAL: Vitals: There were no vitals filed for this visit. There is no height or weight on file to calculate BMI. Wt Readings from Last 3 Encounters:  07/11/23 145 lb 15.1 oz (66.2 kg)  01/22/23 146 lb (66.2 kg)  10/23/22 144 lb 12.8 oz (65.7 kg)   Patient is in no distress; well developed, nourished and groomed; neck is supple  CARDIOVASCULAR: Examination of carotid arteries is normal; no carotid bruits Regular rate and rhythm, no murmurs Examination of peripheral vascular system by observation and palpation is normal  EYES: Ophthalmoscopic exam of optic discs and posterior segments is normal; no papilledema or hemorrhages No results found.  MUSCULOSKELETAL: Gait, strength, tone, movements noted in Neurologic exam below  NEUROLOGIC: MENTAL STATUS:     10/22/2021    1:49 PM  MMSE - Mini Mental State Exam  Orientation to time 5  Orientation to Place 4  Registration 3  Attention/ Calculation 3  Recall 2  Language- name 2 objects 2  Language- repeat 1  Language- follow 3 step command 1  Language- read & follow direction 1  Write a sentence 1  Copy design 1  Total score 24      09/16/2023    4:12 PM 01/22/2023   11:06 AM 10/23/2022    8:35 AM  Montreal Cognitive Assessment   Visuospatial/ Executive (0/5) 2 4 5   Naming (0/3) 2 3 3   Attention: Read list of digits (0/2) 1 2 2   Attention:  Read list of letters (0/1) 0 1 0  Attention: Serial 7 subtraction starting at 100 (0/3) 0 3 1  Language: Repeat phrase (0/2) 0 2 2  Language : Fluency (0/1) 0 0 0  Abstraction (0/2) 0 1 2  Delayed Recall (0/5) 0 0 0  Orientation (0/6) 0 4 4  Total 5 20 19    awake, alert, oriented to person DECR memory  DECR attention and concentration DECR FLUENCY  CRANIAL NERVE:  2nd - no papilledema on fundoscopic exam 2nd, 3rd, 4th, 6th - pupils equal and reactive to light, visual fields full to confrontation, extraocular muscles intact, no nystagmus 5th - facial sensation symmetric 7th - facial strength symmetric 8th - hearing intact 9th - palate elevates symmetrically, uvula midline 11th - shoulder shrug symmetric 12th - tongue protrusion midline  MOTOR:  normal bulk and tone, full strength in the BUE, BLE  SENSORY:  normal and symmetric to light touch, temperature, vibration  COORDINATION:  finger-nose-finger, fine finger movements normal  REFLEXES:  deep tendon reflexes TRACE and symmetric  GAIT/STATION:  narrow based gait     DIAGNOSTIC DATA (LABS, IMAGING, TESTING) - I reviewed patient records, labs, notes, testing and imaging myself where available.  Lab Results  Component Value Date   WBC 4.6 09/29/2019   HGB 11.1 (L) 09/29/2019   HCT 31.9 (L) 09/29/2019   MCV 89.1 09/29/2019   PLT 297 09/29/2019      Component Value Date/Time   NA 142 09/29/2019 0711   NA 144 09/16/2017 1235   K 3.2 (L) 09/29/2019 0711   CL 108 09/29/2019 0711   CO2 24 09/29/2019 0711   GLUCOSE 103 (H) 09/29/2019 0711   BUN 8 09/29/2019 0711   BUN 12 09/16/2017 1235   CREATININE 0.97 09/29/2019 0711   CREATININE 0.80 05/24/2015 1956   CALCIUM 9.3 09/29/2019 0711   PROT 6.1 (L) 09/29/2019 0711   ALBUMIN 3.1 (L) 09/29/2019 0711   AST 31 09/29/2019 0711   ALT 65 (H) 09/29/2019 0711   ALKPHOS 123 09/29/2019 0711   BILITOT 2.3 (H) 09/29/2019 0711   GFRNONAA >60 09/29/2019 0711   GFRAA  >60 09/29/2019 0711   Lab Results  Component Value Date   CHOL 188 06/10/2008   HDL 34.8 (L) 06/10/2008   LDLCALC 136 (H) 06/10/2008   TRIG 85 06/10/2008   CHOLHDL 5.4 CALC 06/10/2008   Lab Results  Component Value Date   HGBA1C 5.6 05/24/2015   Lab Results  Component Value Date   VITAMINB12 572 10/22/2021   Lab Results  Component Value Date   TSH 3.030 10/22/2021    11/06/21 MRI brain (with and without) demonstrating: -Mild to moderate perisylvian atrophy and minimal foci of non-specific gliosis. -No acute findings.  02/25/22 FDG-PET 1. Most significant finding is decreased relative cortical metabolism within the  frontal lobes, LEFT greater than RIGHT. This pattern has been associated with frontotemporal dementia. 2. Maintained relative cortical metabolism in the high parietal lobes which is NOT consistent with typical Alzheimer's type pathology pattern. 3. Focal decreased cortical metabolism in the posterior LEFT parietal lobe is an atypical location for decreased metabolism in common dementia patterns.   ASSESSMENT AND PLAN  72 y.o. year old female here with:  Dx:  1. Moderate dementia with other behavioral disturbance, unspecified dementia type (HCC)     PLAN:  moderate dementia (FTD vs AD) - try to stay active physically and get some exercise (at least 15-30 minutes per day) - eat a nutritious diet with lean protein, plants / vegetables, whole grains; avoid ultra-processed foods - increase social activities, brain stimulation, games, puzzles, hobbies, crafts, arts, music; try new activities; keep it fun! - aim for at least 7-8 hours sleep per night (or more) - avoid smoking and alcohol - caution with medications, finances, driving - safety / supervision issues reviewed - caregiver resources provided (including [email or web address]) - continue seroquel and mirtazapine per palliative care  Return for return to PCP, pending if symptoms worsen or fail to  improve.    Suanne Marker, MD 09/16/2023, 4:53 PM Certified in Neurology, Neurophysiology and Neuroimaging  [facility] [address] [place], [place] [address] [phone]

## 2023-09-16 NOTE — Telephone Encounter (Signed)
Called the [facility] and spoke with the nurse Wynona Canes who did confirm they are managing the pt's medications and there is no need for [place] to send additional refills on any medications. A updated medication list will be faxed to our office today so we can make sure the appropriate changes are updated on the list in epic.  The patient tends to work with the nurse [facility] and so Wynona Canes instructed that any concerns/questions about medications can go through them and they will make sure she is taken care of.  Per Wynona Canes seroquel is being taken 25 mg at 2 pm and 75 mg at bedtime along with the remeron 15 mg at bedtime. The last ov with them was 11/17 and husband reported the medication was working well.

## 2023-09-16 NOTE — Patient Instructions (Addendum)
  moderate dementia (FTD vs AD) - try to stay active physically and get some exercise (at least 15-30 minutes per day) - eat a nutritious diet with lean protein, plants / vegetables, whole grains; avoid ultra-processed foods - increase social activities, brain stimulation, games, puzzles, hobbies, crafts, arts, music; try new activities; keep it fun! - aim for at least 7-8 hours sleep per night (or more) - avoid smoking and alcohol - caution with medications, finances, driving - safety / supervision issues reviewed - caregiver resources provided (including [email or web address])

## 2023-10-01 ENCOUNTER — Ambulatory Visit: Payer: Medicare HMO | Admitting: Psychiatry

## 2023-12-03 ENCOUNTER — Ambulatory Visit: Payer: Medicare PPO | Admitting: Podiatry

## 2023-12-30 ENCOUNTER — Encounter: Payer: Self-pay | Admitting: Podiatry

## 2023-12-30 ENCOUNTER — Ambulatory Visit

## 2023-12-30 ENCOUNTER — Ambulatory Visit: Payer: Medicare PPO | Admitting: Podiatry

## 2023-12-30 DIAGNOSIS — M79674 Pain in right toe(s): Secondary | ICD-10-CM | POA: Diagnosis not present

## 2023-12-30 DIAGNOSIS — M79675 Pain in left toe(s): Secondary | ICD-10-CM

## 2023-12-30 DIAGNOSIS — B351 Tinea unguium: Secondary | ICD-10-CM

## 2023-12-30 DIAGNOSIS — S9002XA Contusion of left ankle, initial encounter: Secondary | ICD-10-CM

## 2023-12-30 DIAGNOSIS — M79642 Pain in left hand: Secondary | ICD-10-CM | POA: Insufficient documentation

## 2024-01-04 NOTE — Progress Notes (Signed)
  Subjective:  Patient ID: [name] Young, female    DOB: 11-29-1950,  MRN: [id number]  Chief Complaint  Patient presents with   Nail Problem    Her toenails are thickened and elongated causing her pain    73 y.o. female presents with the above complaint. History confirmed with patient.  Her nails are thickened and elongated and cause discomfort.  Last was helpful for her  Objective:  Physical Exam: warm, good capillary refill, no trophic changes or ulcerative lesions, normal DP and PT pulses, and normal sensory exam. Left Foot: dystrophic yellowed discolored nail plates with subungual debris Right Foot: dystrophic yellowed discolored nail plates with subungual debris  Assessment:   1. Pain due to onychomycosis of toenails of both feet      Plan:  Patient was evaluated and treated and all questions answered.  Discussed the etiology and treatment options for the condition in detail with the patient. Recommended debridement of the nails today. Sharp and mechanical debridement performed of all painful and mycotic nails today. Nails debrided in length and thickness using a nail nipper to level of comfort.      Return in about 3 months (around 03/31/2024) for painful thick fungal nails.

## 2024-01-27 DIAGNOSIS — F22 Delusional disorders: Secondary | ICD-10-CM | POA: Diagnosis not present

## 2024-01-27 DIAGNOSIS — Z515 Encounter for palliative care: Secondary | ICD-10-CM | POA: Diagnosis not present

## 2024-01-27 DIAGNOSIS — G3109 Other frontotemporal dementia: Secondary | ICD-10-CM | POA: Diagnosis not present

## 2024-03-16 DIAGNOSIS — Z515 Encounter for palliative care: Secondary | ICD-10-CM | POA: Diagnosis not present

## 2024-03-16 DIAGNOSIS — G3109 Other frontotemporal dementia: Secondary | ICD-10-CM | POA: Diagnosis not present

## 2024-03-16 DIAGNOSIS — F22 Delusional disorders: Secondary | ICD-10-CM | POA: Diagnosis not present

## 2024-04-01 ENCOUNTER — Encounter: Payer: Self-pay | Admitting: Podiatry

## 2024-04-01 ENCOUNTER — Ambulatory Visit: Admitting: Podiatry

## 2024-04-01 DIAGNOSIS — M79675 Pain in left toe(s): Secondary | ICD-10-CM | POA: Diagnosis not present

## 2024-04-01 DIAGNOSIS — M79674 Pain in right toe(s): Secondary | ICD-10-CM | POA: Diagnosis not present

## 2024-04-01 DIAGNOSIS — B351 Tinea unguium: Secondary | ICD-10-CM | POA: Diagnosis not present

## 2024-04-01 NOTE — Progress Notes (Signed)

## 2024-05-06 DIAGNOSIS — Z515 Encounter for palliative care: Secondary | ICD-10-CM | POA: Diagnosis not present

## 2024-05-06 DIAGNOSIS — G3109 Other frontotemporal dementia: Secondary | ICD-10-CM | POA: Diagnosis not present

## 2024-06-02 ENCOUNTER — Emergency Department (HOSPITAL_COMMUNITY)

## 2024-06-02 ENCOUNTER — Other Ambulatory Visit: Payer: Self-pay

## 2024-06-02 ENCOUNTER — Emergency Department (HOSPITAL_COMMUNITY)
Admission: EM | Admit: 2024-06-02 | Discharge: 2024-06-02 | Disposition: A | Discharge status: Home / Self Care | Attending: Emergency Medicine | Admitting: Emergency Medicine

## 2024-06-02 ENCOUNTER — Encounter (HOSPITAL_COMMUNITY): Payer: Self-pay

## 2024-06-02 DIAGNOSIS — G319 Degenerative disease of nervous system, unspecified: Secondary | ICD-10-CM | POA: Diagnosis not present

## 2024-06-02 DIAGNOSIS — G3109 Other frontotemporal dementia: Secondary | ICD-10-CM | POA: Insufficient documentation

## 2024-06-02 DIAGNOSIS — Z91040 Latex allergy status: Secondary | ICD-10-CM | POA: Insufficient documentation

## 2024-06-02 DIAGNOSIS — F0280 Dementia in other diseases classified elsewhere without behavioral disturbance: Secondary | ICD-10-CM

## 2024-06-02 DIAGNOSIS — R4781 Slurred speech: Secondary | ICD-10-CM | POA: Insufficient documentation

## 2024-06-02 DIAGNOSIS — R4182 Altered mental status, unspecified: Secondary | ICD-10-CM | POA: Insufficient documentation

## 2024-06-02 DIAGNOSIS — R29818 Other symptoms and signs involving the nervous system: Secondary | ICD-10-CM | POA: Diagnosis not present

## 2024-06-02 DIAGNOSIS — R9431 Abnormal electrocardiogram [ECG] [EKG]: Secondary | ICD-10-CM | POA: Diagnosis not present

## 2024-06-02 DIAGNOSIS — F0390 Unspecified dementia without behavioral disturbance: Secondary | ICD-10-CM | POA: Diagnosis not present

## 2024-06-02 DIAGNOSIS — R4701 Aphasia: Secondary | ICD-10-CM | POA: Diagnosis present

## 2024-06-02 LAB — COMPREHENSIVE METABOLIC PANEL WITH GFR
ALT: 14 U/L (ref 0–44)
AST: 20 U/L (ref 15–41)
Albumin: 4 g/dL (ref 3.5–5.0)
Alkaline Phosphatase: 64 U/L (ref 38–126)
Anion gap: 11 (ref 5–15)
BUN: 17 mg/dL (ref 8–23)
CO2: 23 mmol/L (ref 22–32)
Calcium: 9.5 mg/dL (ref 8.9–10.3)
Chloride: 109 mmol/L (ref 98–111)
Creatinine, Ser: 0.76 mg/dL (ref 0.44–1.00)
GFR, Estimated: >60 mL/min (ref >60)
Glucose, Bld: 117 mg/dL — ABNORMAL HIGH (ref 70–99)
Potassium: 3.7 mmol/L (ref 3.5–5.1)
Sodium: 143 mmol/L (ref 135–145)
Total Bilirubin: 1.4 mg/dL — ABNORMAL HIGH (ref 0.0–1.2)
Total Protein: 7 g/dL (ref 6.5–8.1)

## 2024-06-02 LAB — DIFFERENTIAL
Abs Immature Granulocytes: 0.01 K/uL (ref 0.00–0.07)
Basophils Absolute: 0 K/uL (ref 0.0–0.1)
Basophils Relative: 1 %
Eosinophils Absolute: 0 K/uL (ref 0.0–0.5)
Eosinophils Relative: 0 %
Immature Granulocytes: 0 %
Lymphocytes Relative: 13 %
Lymphs Abs: 0.9 K/uL (ref 0.7–4.0)
Monocytes Absolute: 0.2 K/uL (ref 0.1–1.0)
Monocytes Relative: 3 %
Neutro Abs: 5.5 K/uL (ref 1.7–7.7)
Neutrophils Relative %: 83 %

## 2024-06-02 LAB — I-STAT CHEM 8, ED
BUN: 19 mg/dL (ref 8–23)
Calcium, Ion: 1.18 mmol/L (ref 1.15–1.40)
Chloride: 111 mmol/L (ref 98–111)
Creatinine, Ser: 0.7 mg/dL (ref 0.44–1.00)
Glucose, Bld: 127 mg/dL — ABNORMAL HIGH (ref 70–99)
HCT: 37 % (ref 36.0–46.0)
Hemoglobin: 12.6 g/dL (ref 12.0–15.0)
Potassium: 3.7 mmol/L (ref 3.5–5.1)
Sodium: 144 mmol/L (ref 135–145)
TCO2: 20 mmol/L — ABNORMAL LOW (ref 22–32)

## 2024-06-02 LAB — RAPID URINE DRUG SCREEN, HOSP PERFORMED
Amphetamines: NOT DETECTED
Barbiturates: NOT DETECTED
Benzodiazepines: NOT DETECTED
Cocaine: NOT DETECTED
Opiates: NOT DETECTED
Tetrahydrocannabinol: NOT DETECTED

## 2024-06-02 LAB — CBC
HCT: 39.4 % (ref 36.0–46.0)
Hemoglobin: 13.3 g/dL (ref 12.0–15.0)
MCH: 30.4 pg (ref 26.0–34.0)
MCHC: 33.8 g/dL (ref 30.0–36.0)
MCV: 90.2 fL (ref 80.0–100.0)
Platelets: 237 K/uL (ref 150–400)
RBC: 4.37 MIL/uL (ref 3.87–5.11)
RDW: 12.5 % (ref 11.5–15.5)
WBC: 6.6 K/uL (ref 4.0–10.5)
nRBC: 0 % (ref 0.0–0.2)

## 2024-06-02 LAB — APTT
aPTT: >200 s (ref 24–36)
aPTT: 27 s (ref 24–36)

## 2024-06-02 LAB — PROTIME-INR
INR: 3 — ABNORMAL HIGH (ref 0.8–1.2)
INR: 1 (ref 0.8–1.2)
Prothrombin Time: 32.4 s — ABNORMAL HIGH (ref 11.4–15.2)
Prothrombin Time: 14 s (ref 11.4–15.2)

## 2024-06-02 LAB — VALPROIC ACID LEVEL: Valproic Acid Lvl: <10 ug/mL — ABNORMAL LOW (ref 50–100)

## 2024-06-02 LAB — URINALYSIS, ROUTINE W REFLEX MICROSCOPIC
Bacteria, UA: NONE SEEN
Bilirubin Urine: NEGATIVE
Glucose, UA: NEGATIVE mg/dL
Hgb urine dipstick: NEGATIVE
Ketones, ur: NEGATIVE mg/dL
Nitrite: NEGATIVE
Protein, ur: NEGATIVE mg/dL
Specific Gravity, Urine: 1.027 (ref 1.005–1.030)
pH: 5 (ref 5.0–8.0)

## 2024-06-02 LAB — ETHANOL: Alcohol, Ethyl (B): <15 mg/dL (ref <15)

## 2024-06-02 LAB — CBG MONITORING, ED: Glucose-Capillary: 131 mg/dL — ABNORMAL HIGH (ref 70–99)

## 2024-06-02 NOTE — ED Triage Notes (Signed)
 C/O slurred speech and minimal facial droop. Denies blurred vision/ headache. Hx of dementia. Unsure of numbness/tingling. Family states pt is usually more responsive. Pt responds to questions but is confused. Family in triage with pt.

## 2024-06-02 NOTE — Discharge Instructions (Signed)
 1.  At this time there is not an exact cause known for your symptoms today.  Your MRI does not show evidence of a stroke. 2.  Your lab work does not suggest an infection.  A urine culture is being done to see if there is any sign of urinary tract infection. 3.  See your doctor for recheck as soon as possible.  Monitor for fever or any other signs of infection or other change.

## 2024-06-02 NOTE — ED Provider Notes (Signed)
 [place] EMERGENCY DEPARTMENT AT [facility] Provider Note   CSN: [id number] Arrival date & time: 06/02/24  1342     Patient presents with: Aphasia and Failure To Thrive   [name] Young is a 73 y.o. female.   HPI Patient has a history of frontotemporal dementia.  At baseline she has good days and bad days.  She is cared for at home by her husband.  The patient's husband reports that the patient is not really taking any medications at this time.  Sometimes she is resistant to taking medications and they have not found that anything is very helpful.  Today the patient had what seemed to be a fairly abrupt change in some of her baseline function.  The patient's husband notes that last night he might of noticed some slight changes well in terms of some slight slurring speech and decreased interactiveness.  He reports this morning this was more pronounced and they also observed that she seemed to have a left-sided facial droop.  This was not typical for her and she was less verbally interactive than baseline.  There have not been other recent changes.  He reports that she is selective in what and when she eats.  He reports that she does fairly willingly eat sweets and foods that she likes a lot but does not have a very broad diet however also enjoys other specific meals.  There is not been any fever.  No cough no vomiting no diarrhea.    Prior to Admission medications   Medication Sig Start Date End Date Taking? Authorizing Provider  acetaminophen  (TYLENOL ) 500 MG tablet Take 2 tablets (1,000 mg total) by mouth every 8 (eight) hours as needed for mild pain. Patient taking differently: Take 1,000 mg by mouth as needed for mild pain (pain score 1-3). 02/26/19   Vicci Burnard SAUNDERS, PA-C  mirtazapine (REMERON) 15 MG tablet Take 15 mg by mouth at bedtime.    [provider]  QUEtiapine  (SEROQUEL ) 50 MG tablet Take 50 mg by mouth 2 (two) times daily. 25mg  at noon; 75mg  at bedtime 09/04/23    [provider]    Allergies: Codeine, Latex, Mobic  [meloxicam ], and Tape    Review of Systems  Updated Vital Signs BP 138/82   Pulse (!) 58   Temp 98.3 F (36.8 C) (Oral)   Resp 18   Ht 5' 1 (1.549 m)   Wt 68 kg   SpO2 95%   BMI 28.34 kg/m   Physical Exam Constitutional:      Comments: Patient is alert.  Well-nourished well-developed.  At this time calm and pleasant.  No respiratory distress.  HENT:     Head: Normocephalic and atraumatic.     Nose: Nose normal.     Mouth/Throat:     Pharynx: Oropharynx is clear. No oropharyngeal exudate.  Eyes:     General:        Right eye: No discharge.        Left eye: No discharge.     Extraocular Movements: Extraocular movements intact.     Pupils: Pupils are equal, round, and reactive to light.  Cardiovascular:     Rate and Rhythm: Normal rate and regular rhythm.  Pulmonary:     Effort: Pulmonary effort is normal.     Breath sounds: Normal breath sounds.  Abdominal:     General: There is no distension.     Palpations: Abdomen is soft.     Tenderness: There is no abdominal  tenderness. There is no guarding.  Musculoskeletal:        General: No swelling or tenderness. Normal range of motion.     Cervical back: Neck supple.     Right lower leg: No edema.     Left lower leg: No edema.  Skin:    General: Skin is warm and dry.     Findings: No rash.  Neurological:     Comments: Patient is alert.  She is interactive but does not have specific recall.  She looks to family for additional history and information.  Patient can assist in following some commands.  She makes simple comments.  No focal motor deficits present.  Patient has grip strength bilaterally that is 5\5.  She can extend and push against significant pressure with both lower extremities with flexion and extension.  I do not appreciate any facial droop or asymmetry with smile or speech.     (all labs ordered are listed, but only abnormal results are  displayed) Labs Reviewed  PROTIME-INR - Abnormal; Notable for the following components:      Result Value   Prothrombin Time 32.4 (*)    INR 3.0 (*)    All other components within normal limits  APTT - Abnormal; Notable for the following components:   aPTT >200 (*)    All other components within normal limits  COMPREHENSIVE METABOLIC PANEL WITH GFR - Abnormal; Notable for the following components:   Glucose, Bld 117 (*)    Total Bilirubin 1.4 (*)    All other components within normal limits  URINALYSIS, ROUTINE W REFLEX MICROSCOPIC - Abnormal; Notable for the following components:   APPearance HAZY (*)    Leukocytes,Ua MODERATE (*)    All other components within normal limits  I-STAT CHEM 8, ED - Abnormal; Notable for the following components:   Glucose, Bld 127 (*)    TCO2 20 (*)    All other components within normal limits  CBG MONITORING, ED - Abnormal; Notable for the following components:   Glucose-Capillary 131 (*)    All other components within normal limits  URINE CULTURE  CBC  DIFFERENTIAL  ETHANOL  PROTIME-INR  APTT  RAPID URINE DRUG SCREEN, HOSP PERFORMED  VALPROIC ACID  LEVEL  VITAMIN K1, SERUM    EKG: EKG Interpretation Date/Time:  Wednesday June 02 2024 18:29:06 EDT Ventricular Rate:  51 PR Interval:  196 QRS Duration:  90 QT Interval:  465 QTC Calculation: 429 R Axis:   -19  Text Interpretation: Sinus rhythm Borderline left axis deviation Low voltage, precordial leads duplicate Confirmed by Armenta Canning [phone]) on 06/02/2024 10:40:20 PM  Radiology: MR Brain Wo Contrast (neuro protocol) Result Date: 06/02/2024 CLINICAL DATA:  Neuro deficit, acute, stroke suspected EXAM: MRI HEAD WITHOUT CONTRAST TECHNIQUE: Multiplanar, multiecho pulse sequences of the brain and surrounding structures were obtained without intravenous contrast. COMPARISON:  CT head from earlier today. FINDINGS: Brain: No acute infarction, hemorrhage, hydrocephalus, extra-axial  collection or mass lesion. Cerebral atrophy. Motion limited study. Vascular: Major arterial flow voids are maintained skull base. Skull and upper cervical spine: Normal marrow signal. Sinuses/Orbits: Negative. Other: No mastoid effusions. IMPRESSION: No evidence of acute intracranial abnormality. Electronically Signed   By: Gilmore GORMAN Molt M.D.   On: 06/02/2024 21:44   CT HEAD WO CONTRAST Result Date: 06/02/2024 CLINICAL DATA:  Neuro deficit, acute, stroke suspected EXAM: CT HEAD WITHOUT CONTRAST TECHNIQUE: Contiguous axial images were obtained from the base of the skull through the vertex without intravenous contrast. RADIATION DOSE  REDUCTION: This exam was performed according to the departmental dose-optimization program which includes automated exposure control, adjustment of the mA and/or kV according to patient size and/or use of iterative reconstruction technique. COMPARISON:  MRI head 11/06/2021. FINDINGS: Brain: No evidence of acute infarction, hemorrhage, hydrocephalus, extra-axial collection or mass lesion/mass effect. Vascular: No hyperdense vessel or unexpected calcification. Skull: Normal. Negative for fracture or focal lesion. Sinuses/Orbits: No acute finding. IMPRESSION: No evidence of acute intracranial abnormality. Electronically Signed   By: Gilmore GORMAN Molt M.D.   On: 06/02/2024 15:06     Procedures   Medications Ordered in the ED - No data to display                                  Medical Decision Making Amount and/or Complexity of Data Reviewed Labs: ordered. Radiology: ordered.   Patient presents as outlined.  Patient does have a and at baseline has cognitive impairment.  Family members noted symptoms that seem concerning for possible stroke earlier today.  At this time for my exam patient does not have focal findings.  However based on history will proceed with stroke evaluation and CT head.  Initial labs done through triage showed unusual results for INR being  elevated at 3 APTT greater than 200.  Patient is not on anticoagulants and does not have evident liver disease or reason for these values.  Will repeat for possible lab error.  CMP otherwise normal.  CBC normal with normal differential.  Platelets are 237.  CT head no acute findings.  MR brain negative for acute intracranial abnormality.  Repeat INR and APTT are normal.  At this time, patient is alert.  She shows no distress.  Vital signs are stable.  The patient does not have fever or signs of infectious etiology.  Urine shows moderate leuk esterase with 11-20 white cells and 6-10 squamous epithelial cells.  At this time without fever, leukocytosis or evident symptoms of UTI we will proceed with culture.  UTI could account for some behavioral changes in the setting of dementia but at this time without more positive findings we will opt not to treat empirically.  Family members aware of proceeding with culture and watching for any signs of fever or urinary frequency.  Patient is discharged in good condition with plan to follow-up outpatient for recheck.      Final diagnoses:  Frontotemporal dementia (HCC)  Altered mental status, unspecified altered mental status type  Slurred speech    ED Discharge Orders     None          Armenta Canning, MD 06/02/24 2245

## 2024-06-02 NOTE — ED Notes (Signed)
 Patient transported to MRI

## 2024-06-03 ENCOUNTER — Encounter: Payer: Self-pay | Admitting: Diagnostic Neuroimaging

## 2024-06-03 LAB — URINE CULTURE: Culture: <10000 — AB

## 2024-06-05 LAB — VITAMIN K1, SERUM: VITAMIN K1: 0.33 ng/mL (ref 0.10–2.20)

## 2024-06-15 DIAGNOSIS — G3109 Other frontotemporal dementia: Secondary | ICD-10-CM | POA: Diagnosis not present

## 2024-06-15 DIAGNOSIS — Z515 Encounter for palliative care: Secondary | ICD-10-CM | POA: Diagnosis not present

## 2024-07-01 ENCOUNTER — Ambulatory Visit: Admitting: Podiatry

## 2024-07-01 VITALS — Ht 61.0 in | Wt 150.0 lb

## 2024-07-01 DIAGNOSIS — M79674 Pain in right toe(s): Secondary | ICD-10-CM | POA: Diagnosis not present

## 2024-07-01 DIAGNOSIS — M79675 Pain in left toe(s): Secondary | ICD-10-CM

## 2024-07-01 DIAGNOSIS — B351 Tinea unguium: Secondary | ICD-10-CM | POA: Diagnosis not present

## 2024-07-01 NOTE — Patient Instructions (Signed)
 Soak Instructions    Place 1/4 cup of epsom salts (or betadine, or white vinegar) in a quart of warm tap water.  Submerge your foot or feet   for 20 minutes.  This soak should be done twice a day.  Next, remove your foot or feet from solution, blot dry the affected area

## 2024-07-04 NOTE — Progress Notes (Signed)
  Subjective:  Patient ID: [name] Young, female    DOB: 02/05/1951,  MRN: [id number]  Chief Complaint  Patient presents with   Nail Problem    RM 10 RFC/Nail Trim.    73 y.o. female presents with the above complaint. History confirmed with patient.  Her nails are thickened and elongated and cause discomfort.     Objective:  Physical Exam: warm, good capillary refill, no trophic changes or ulcerative lesions, normal DP and PT pulses, and normal sensory exam. Left Foot: dystrophic yellowed discolored nail plates with subungual debris Right Foot: dystrophic yellowed discolored nail plates with subungual debris  Assessment:   1. Pain due to onychomycosis of toenails of both feet      Plan:  Patient was evaluated and treated and all questions answered.  Discussed the etiology and treatment options for the condition in detail with the patient. Recommended debridement of the nails today. Sharp and mechanical debridement performed of all painful and mycotic nails today. Nails debrided in length and thickness using a nail nipper to level of comfort.      Return in about 3 months (around 09/30/2024) for nail care.

## 2024-07-21 ENCOUNTER — Encounter: Payer: Self-pay | Admitting: Diagnostic Neuroimaging

## 2024-08-18 DIAGNOSIS — Z03818 Encounter for observation for suspected exposure to other biological agents ruled out: Secondary | ICD-10-CM | POA: Diagnosis not present

## 2024-08-18 DIAGNOSIS — R051 Acute cough: Secondary | ICD-10-CM | POA: Diagnosis not present
